# Patient Record
Sex: Female | Born: 1945 | Race: White | Hispanic: No | Smoking: Former smoker
Health system: Southern US, Community
[De-identification: ages and names within clinical notes are randomized; demographics above are authoritative.]

## PROBLEM LIST (undated history)

## (undated) DIAGNOSIS — E119 Type 2 diabetes mellitus without complications: Secondary | ICD-10-CM

## (undated) DIAGNOSIS — K5790 Diverticulosis of intestine, part unspecified, without perforation or abscess without bleeding: Secondary | ICD-10-CM

## (undated) DIAGNOSIS — T7840XA Allergy, unspecified, initial encounter: Secondary | ICD-10-CM

## (undated) DIAGNOSIS — M545 Low back pain, unspecified: Secondary | ICD-10-CM

## (undated) DIAGNOSIS — K732 Chronic active hepatitis, not elsewhere classified: Secondary | ICD-10-CM

## (undated) DIAGNOSIS — A159 Respiratory tuberculosis unspecified: Secondary | ICD-10-CM

## (undated) DIAGNOSIS — K760 Fatty (change of) liver, not elsewhere classified: Secondary | ICD-10-CM

## (undated) DIAGNOSIS — J849 Interstitial pulmonary disease, unspecified: Secondary | ICD-10-CM

## (undated) DIAGNOSIS — K449 Diaphragmatic hernia without obstruction or gangrene: Secondary | ICD-10-CM

## (undated) DIAGNOSIS — K7460 Unspecified cirrhosis of liver: Secondary | ICD-10-CM

## (undated) DIAGNOSIS — K5730 Diverticulosis of large intestine without perforation or abscess without bleeding: Secondary | ICD-10-CM

## (undated) DIAGNOSIS — K219 Gastro-esophageal reflux disease without esophagitis: Secondary | ICD-10-CM

## (undated) DIAGNOSIS — E785 Hyperlipidemia, unspecified: Secondary | ICD-10-CM

## (undated) HISTORY — PX: OTHER SURGICAL HISTORY: SHX169

## (undated) HISTORY — DX: Low back pain: M54.5

## (undated) HISTORY — DX: Chronic active hepatitis, not elsewhere classified: K73.2

## (undated) HISTORY — DX: Diverticulosis of intestine, part unspecified, without perforation or abscess without bleeding: K57.90

## (undated) HISTORY — DX: Allergy, unspecified, initial encounter: T78.40XA

## (undated) HISTORY — DX: Diverticulosis of large intestine without perforation or abscess without bleeding: K57.30

## (undated) HISTORY — DX: Type 2 diabetes mellitus without complications: E11.9

## (undated) HISTORY — DX: Hyperlipidemia, unspecified: E78.5

## (undated) HISTORY — PX: EYE SURGERY: SHX253

## (undated) HISTORY — PX: TONSILLECTOMY: SUR1361

## (undated) HISTORY — PX: APPENDECTOMY: SHX54

## (undated) HISTORY — DX: Diaphragmatic hernia without obstruction or gangrene: K44.9

## (undated) HISTORY — DX: Low back pain, unspecified: M54.50

## (undated) HISTORY — DX: Unspecified cirrhosis of liver: K74.60

## (undated) HISTORY — DX: Interstitial pulmonary disease, unspecified: J84.9

## (undated) HISTORY — PX: UTERINE SUSPENSION: SUR1430

## (undated) HISTORY — DX: Gastro-esophageal reflux disease without esophagitis: K21.9

## (undated) HISTORY — PX: TUBAL LIGATION: SHX77

## (undated) HISTORY — DX: Respiratory tuberculosis unspecified: A15.9

## (undated) HISTORY — DX: Fatty (change of) liver, not elsewhere classified: K76.0

---

## 1994-05-15 ENCOUNTER — Encounter: Payer: Self-pay | Admitting: Pulmonary Disease

## 1994-06-01 HISTORY — PX: HAND SURGERY: SHX662

## 1998-04-14 ENCOUNTER — Emergency Department (HOSPITAL_COMMUNITY): Admission: EM | Admit: 1998-04-14 | Discharge: 1998-04-15 | Payer: Self-pay | Admitting: Emergency Medicine

## 1998-07-22 ENCOUNTER — Encounter: Payer: Self-pay | Admitting: Emergency Medicine

## 1998-07-22 ENCOUNTER — Emergency Department (HOSPITAL_COMMUNITY): Admission: EM | Admit: 1998-07-22 | Discharge: 1998-07-22 | Payer: Self-pay | Admitting: Emergency Medicine

## 1999-02-25 ENCOUNTER — Other Ambulatory Visit: Admission: RE | Admit: 1999-02-25 | Discharge: 1999-02-25 | Payer: Self-pay | Admitting: Internal Medicine

## 1999-10-15 ENCOUNTER — Ambulatory Visit (HOSPITAL_COMMUNITY): Admission: RE | Admit: 1999-10-15 | Discharge: 1999-10-15 | Payer: Self-pay | Admitting: Internal Medicine

## 1999-10-15 ENCOUNTER — Encounter: Payer: Self-pay | Admitting: Internal Medicine

## 2000-06-18 ENCOUNTER — Other Ambulatory Visit: Admission: RE | Admit: 2000-06-18 | Discharge: 2000-06-18 | Payer: Self-pay | Admitting: Internal Medicine

## 2000-09-02 ENCOUNTER — Encounter: Payer: Self-pay | Admitting: Emergency Medicine

## 2000-09-02 ENCOUNTER — Emergency Department (HOSPITAL_COMMUNITY): Admission: EM | Admit: 2000-09-02 | Discharge: 2000-09-02 | Payer: Self-pay | Admitting: Emergency Medicine

## 2000-11-23 ENCOUNTER — Ambulatory Visit (HOSPITAL_COMMUNITY): Admission: RE | Admit: 2000-11-23 | Discharge: 2000-11-23 | Payer: Self-pay | Admitting: Gastroenterology

## 2000-11-23 ENCOUNTER — Encounter: Payer: Self-pay | Admitting: Gastroenterology

## 2001-12-27 ENCOUNTER — Other Ambulatory Visit: Admission: RE | Admit: 2001-12-27 | Discharge: 2001-12-27 | Payer: Self-pay | Admitting: Internal Medicine

## 2002-03-05 ENCOUNTER — Emergency Department (HOSPITAL_COMMUNITY): Admission: EM | Admit: 2002-03-05 | Discharge: 2002-03-05 | Payer: Self-pay | Admitting: *Deleted

## 2003-01-02 ENCOUNTER — Ambulatory Visit (HOSPITAL_COMMUNITY): Admission: RE | Admit: 2003-01-02 | Discharge: 2003-01-02 | Payer: Self-pay | Admitting: Internal Medicine

## 2003-01-02 ENCOUNTER — Encounter: Payer: Self-pay | Admitting: Internal Medicine

## 2004-02-27 ENCOUNTER — Other Ambulatory Visit: Admission: RE | Admit: 2004-02-27 | Discharge: 2004-02-27 | Payer: Self-pay | Admitting: Internal Medicine

## 2004-04-23 ENCOUNTER — Ambulatory Visit: Payer: Self-pay | Admitting: Internal Medicine

## 2004-06-01 HISTORY — PX: SHOULDER SURGERY: SHX246

## 2004-10-15 ENCOUNTER — Ambulatory Visit: Payer: Self-pay | Admitting: Internal Medicine

## 2004-11-04 ENCOUNTER — Ambulatory Visit: Payer: Self-pay | Admitting: Gastroenterology

## 2004-11-14 ENCOUNTER — Ambulatory Visit: Payer: Self-pay | Admitting: Gastroenterology

## 2004-12-10 ENCOUNTER — Ambulatory Visit: Payer: Self-pay | Admitting: Internal Medicine

## 2005-02-12 ENCOUNTER — Ambulatory Visit: Payer: Self-pay | Admitting: Internal Medicine

## 2005-02-15 ENCOUNTER — Encounter: Admission: RE | Admit: 2005-02-15 | Discharge: 2005-02-15 | Payer: Self-pay | Admitting: Internal Medicine

## 2005-04-21 ENCOUNTER — Ambulatory Visit: Payer: Self-pay | Admitting: Internal Medicine

## 2005-04-22 ENCOUNTER — Emergency Department (HOSPITAL_COMMUNITY): Admission: EM | Admit: 2005-04-22 | Discharge: 2005-04-22 | Payer: Self-pay | Admitting: Emergency Medicine

## 2005-05-01 ENCOUNTER — Ambulatory Visit: Payer: Self-pay

## 2005-05-21 ENCOUNTER — Ambulatory Visit: Payer: Self-pay | Admitting: Internal Medicine

## 2005-06-03 ENCOUNTER — Ambulatory Visit: Payer: Self-pay | Admitting: Internal Medicine

## 2005-06-19 ENCOUNTER — Encounter: Admission: RE | Admit: 2005-06-19 | Discharge: 2005-06-19 | Payer: Self-pay | Admitting: Orthopedic Surgery

## 2005-07-30 ENCOUNTER — Ambulatory Visit: Payer: Self-pay | Admitting: Internal Medicine

## 2005-08-12 ENCOUNTER — Ambulatory Visit: Payer: Self-pay | Admitting: Internal Medicine

## 2005-08-12 ENCOUNTER — Other Ambulatory Visit: Admission: RE | Admit: 2005-08-12 | Discharge: 2005-08-12 | Payer: Self-pay | Admitting: Internal Medicine

## 2005-08-14 ENCOUNTER — Ambulatory Visit: Payer: Self-pay | Admitting: Internal Medicine

## 2005-10-12 ENCOUNTER — Ambulatory Visit: Payer: Self-pay | Admitting: Internal Medicine

## 2005-10-19 ENCOUNTER — Ambulatory Visit: Payer: Self-pay | Admitting: Internal Medicine

## 2006-05-15 ENCOUNTER — Ambulatory Visit: Payer: Self-pay | Admitting: Family Medicine

## 2006-07-05 ENCOUNTER — Ambulatory Visit: Payer: Self-pay | Admitting: Internal Medicine

## 2006-08-13 ENCOUNTER — Ambulatory Visit: Payer: Self-pay | Admitting: Internal Medicine

## 2006-08-13 LAB — CONVERTED CEMR LAB
Basophils Relative: 0.1 % (ref 0.0–1.0)
Bilirubin, Direct: 0.1 mg/dL (ref 0.0–0.3)
CA 125: 10.1 units/mL (ref 0.0–30.2)
CO2: 33 meq/L — ABNORMAL HIGH (ref 19–32)
Calcium: 9.1 mg/dL (ref 8.4–10.5)
Chloride: 105 meq/L (ref 96–112)
Creatinine, Ser: 0.7 mg/dL (ref 0.4–1.2)
Direct LDL: 139.3 mg/dL
Glucose, Bld: 92 mg/dL (ref 70–99)
HCT: 43.3 % (ref 36.0–46.0)
Hemoglobin: 14.9 g/dL (ref 12.0–15.0)
Lymphocytes Relative: 30 % (ref 12.0–46.0)
MCHC: 34.4 g/dL (ref 30.0–36.0)
Monocytes Absolute: 0.5 10*3/uL (ref 0.2–0.7)
Neutrophils Relative %: 53.6 % (ref 43.0–77.0)
Platelets: 265 10*3/uL (ref 150–400)
Potassium: 3.8 meq/L (ref 3.5–5.1)
Sodium: 144 meq/L (ref 135–145)
TSH: 2.8 microintl units/mL (ref 0.35–5.50)
Triglycerides: 125 mg/dL (ref 0–149)

## 2006-08-30 ENCOUNTER — Other Ambulatory Visit: Admission: RE | Admit: 2006-08-30 | Discharge: 2006-08-30 | Payer: Self-pay | Admitting: Internal Medicine

## 2006-08-30 ENCOUNTER — Encounter: Payer: Self-pay | Admitting: Internal Medicine

## 2006-08-30 ENCOUNTER — Ambulatory Visit: Payer: Self-pay | Admitting: Internal Medicine

## 2006-08-30 LAB — CONVERTED CEMR LAB
Albumin: 4.2 g/dL (ref 3.5–5.2)
Ceruloplasmin: 34 mg/dL (ref 21–63)
Hep B S Ab: NEGATIVE
Indirect Bilirubin: 0.3 mg/dL (ref 0.0–0.9)
Total Bilirubin: 0.4 mg/dL (ref 0.3–1.2)

## 2006-09-10 ENCOUNTER — Ambulatory Visit: Payer: Self-pay | Admitting: Internal Medicine

## 2006-09-29 ENCOUNTER — Ambulatory Visit: Payer: Self-pay | Admitting: Gastroenterology

## 2006-09-29 LAB — CONVERTED CEMR LAB
ALT: 230 units/L — ABNORMAL HIGH (ref 0–40)
AST: 133 units/L — ABNORMAL HIGH (ref 0–37)
INR: 0.8 — ABNORMAL LOW (ref 0.9–2.0)
Prothrombin Time: 10.9 s (ref 10.0–14.0)
Total Bilirubin: 0.6 mg/dL (ref 0.3–1.2)
Total Protein: 7.3 g/dL (ref 6.0–8.3)

## 2006-11-02 ENCOUNTER — Ambulatory Visit: Payer: Self-pay | Admitting: Internal Medicine

## 2006-11-02 LAB — CONVERTED CEMR LAB
ALT: 248 units/L — ABNORMAL HIGH (ref 0–40)
Total Bilirubin: 0.8 mg/dL (ref 0.3–1.2)
Total Protein: 7.1 g/dL (ref 6.0–8.3)

## 2006-12-06 ENCOUNTER — Ambulatory Visit: Payer: Self-pay | Admitting: Gastroenterology

## 2006-12-06 LAB — CONVERTED CEMR LAB
ALT: 285 units/L — ABNORMAL HIGH (ref 0–35)
AST: 169 units/L — ABNORMAL HIGH (ref 0–37)
Albumin: 3.6 g/dL (ref 3.5–5.2)
Alkaline Phosphatase: 113 units/L (ref 39–117)
Basophils Absolute: 0 10*3/uL (ref 0.0–0.1)
Bilirubin, Direct: 0.1 mg/dL (ref 0.0–0.3)
Cholesterol: 222 mg/dL (ref 0–200)
Direct LDL: 157.1 mg/dL
Ferritin: 345 ng/mL — ABNORMAL HIGH (ref 10.0–291.0)
HCT: 43.6 % (ref 36.0–46.0)
HDL: 47.1 mg/dL (ref >39.0)
Hemoglobin: 14.6 g/dL (ref 12.0–15.0)
INR: 0.8 — ABNORMAL LOW (ref 0.9–2.0)
MCV: 92.7 fL (ref 78.0–100.0)
Monocytes Absolute: 0.6 10*3/uL (ref 0.2–0.7)
Neutrophils Relative %: 57.7 % (ref 43.0–77.0)
Prothrombin Time: 10.8 s (ref 10.0–14.0)
RBC: 4.7 M/uL (ref 3.87–5.11)
Saturation Ratios: 20.6 % (ref 20.0–50.0)

## 2006-12-07 ENCOUNTER — Ambulatory Visit: Payer: Self-pay | Admitting: Gastroenterology

## 2006-12-07 DIAGNOSIS — K449 Diaphragmatic hernia without obstruction or gangrene: Secondary | ICD-10-CM | POA: Insufficient documentation

## 2006-12-07 HISTORY — DX: Diaphragmatic hernia without obstruction or gangrene: K44.9

## 2006-12-14 ENCOUNTER — Ambulatory Visit (HOSPITAL_COMMUNITY): Admission: RE | Admit: 2006-12-14 | Discharge: 2006-12-14 | Payer: Self-pay | Admitting: Gastroenterology

## 2006-12-22 DIAGNOSIS — M545 Low back pain, unspecified: Secondary | ICD-10-CM | POA: Insufficient documentation

## 2006-12-29 ENCOUNTER — Ambulatory Visit: Admission: RE | Admit: 2006-12-29 | Discharge: 2006-12-29 | Payer: Self-pay | Admitting: Gastroenterology

## 2006-12-29 ENCOUNTER — Encounter (INDEPENDENT_AMBULATORY_CARE_PROVIDER_SITE_OTHER): Payer: Self-pay | Admitting: Interventional Radiology

## 2006-12-29 ENCOUNTER — Encounter: Payer: Self-pay | Admitting: Internal Medicine

## 2007-01-03 ENCOUNTER — Ambulatory Visit: Payer: Self-pay | Admitting: Internal Medicine

## 2007-01-03 DIAGNOSIS — K5730 Diverticulosis of large intestine without perforation or abscess without bleeding: Secondary | ICD-10-CM

## 2007-01-03 HISTORY — DX: Diverticulosis of large intestine without perforation or abscess without bleeding: K57.30

## 2007-01-03 LAB — CONVERTED CEMR LAB
ALT: 265 units/L — ABNORMAL HIGH (ref 0–35)
AST: 184 units/L — ABNORMAL HIGH (ref 0–37)
Albumin: 3.6 g/dL (ref 3.5–5.2)
Alkaline Phosphatase: 86 units/L (ref 39–117)
Bilirubin, Direct: 0.1 mg/dL (ref 0.0–0.3)

## 2007-01-04 ENCOUNTER — Ambulatory Visit: Payer: Self-pay | Admitting: Internal Medicine

## 2007-01-12 ENCOUNTER — Ambulatory Visit: Payer: Self-pay | Admitting: Gastroenterology

## 2007-02-15 ENCOUNTER — Encounter: Payer: Self-pay | Admitting: Internal Medicine

## 2007-02-22 ENCOUNTER — Encounter: Payer: Self-pay | Admitting: Internal Medicine

## 2007-03-24 ENCOUNTER — Encounter: Payer: Self-pay | Admitting: Internal Medicine

## 2007-04-20 ENCOUNTER — Telehealth: Payer: Self-pay | Admitting: Internal Medicine

## 2007-05-06 ENCOUNTER — Telehealth (INDEPENDENT_AMBULATORY_CARE_PROVIDER_SITE_OTHER): Payer: Self-pay | Admitting: *Deleted

## 2007-07-21 ENCOUNTER — Ambulatory Visit: Payer: Self-pay | Admitting: Cardiovascular Disease

## 2007-08-10 ENCOUNTER — Ambulatory Visit: Payer: Self-pay | Admitting: Internal Medicine

## 2007-08-10 LAB — CONVERTED CEMR LAB
ALT: 202 units/L — ABNORMAL HIGH (ref 0–35)
Albumin: 3.9 g/dL (ref 3.5–5.2)
Alkaline Phosphatase: 95 units/L (ref 39–117)
BUN: 10 mg/dL (ref 6–23)
Basophils Absolute: 0 10*3/uL (ref 0.0–0.1)
Bilirubin, Direct: 0.2 mg/dL (ref 0.0–0.3)
CO2: 32 meq/L (ref 19–32)
Creatinine, Ser: 0.8 mg/dL (ref 0.4–1.2)
Direct LDL: 170.4 mg/dL
GFR calc non Af Amer: 78 mL/min
Glucose, Urine, Semiquant: NEGATIVE
HCT: 46.6 % — ABNORMAL HIGH (ref 36.0–46.0)
HDL: 44.1 mg/dL (ref >39.0)
Hemoglobin: 15.5 g/dL — ABNORMAL HIGH (ref 12.0–15.0)
Hgb A1c MFr Bld: 6 % (ref 4.6–6.0)
Ketones, urine, test strip: NEGATIVE
MCHC: 33.2 g/dL (ref 30.0–36.0)
MCV: 94.8 fL (ref 78.0–100.0)
Microalb Creat Ratio: 3.1 mg/g (ref 0.0–30.0)
Monocytes Relative: 9.5 % (ref 3.0–11.0)
Neutrophils Relative %: 56.7 % (ref 43.0–77.0)
Platelets: 213 10*3/uL (ref 150–400)
Potassium: 5 meq/L (ref 3.5–5.1)
Protein, U semiquant: NEGATIVE
RBC: 4.92 M/uL (ref 3.87–5.11)
RDW: 12.6 % (ref 11.5–14.6)
Sodium: 145 meq/L (ref 135–145)
Specific Gravity, Urine: 1.025
Urobilinogen, UA: 0.2
VLDL: 20 mg/dL (ref 0–40)
WBC Urine, dipstick: NEGATIVE

## 2007-08-12 ENCOUNTER — Ambulatory Visit: Payer: Self-pay

## 2007-08-12 ENCOUNTER — Encounter: Payer: Self-pay | Admitting: Cardiovascular Disease

## 2007-08-16 ENCOUNTER — Other Ambulatory Visit: Admission: RE | Admit: 2007-08-16 | Discharge: 2007-08-16 | Payer: Self-pay | Admitting: Internal Medicine

## 2007-08-16 ENCOUNTER — Encounter: Payer: Self-pay | Admitting: Internal Medicine

## 2007-08-16 ENCOUNTER — Ambulatory Visit: Payer: Self-pay | Admitting: Internal Medicine

## 2007-08-16 DIAGNOSIS — E785 Hyperlipidemia, unspecified: Secondary | ICD-10-CM

## 2007-08-20 ENCOUNTER — Encounter: Payer: Self-pay | Admitting: Internal Medicine

## 2007-09-01 ENCOUNTER — Ambulatory Visit: Payer: Self-pay | Admitting: Internal Medicine

## 2007-09-01 LAB — CONVERTED CEMR LAB
Prothrombin Time: 11.4 s (ref 10.9–13.3)
aPTT: 32.4 s — ABNORMAL HIGH (ref 21.7–29.8)

## 2007-09-02 ENCOUNTER — Telehealth: Payer: Self-pay | Admitting: *Deleted

## 2007-09-05 ENCOUNTER — Encounter: Payer: Self-pay | Admitting: Internal Medicine

## 2007-09-05 DIAGNOSIS — K219 Gastro-esophageal reflux disease without esophagitis: Secondary | ICD-10-CM | POA: Insufficient documentation

## 2007-09-05 DIAGNOSIS — M940 Chondrocostal junction syndrome [Tietze]: Secondary | ICD-10-CM | POA: Insufficient documentation

## 2007-09-08 ENCOUNTER — Telehealth: Payer: Self-pay | Admitting: Internal Medicine

## 2007-09-20 ENCOUNTER — Encounter: Payer: Self-pay | Admitting: Internal Medicine

## 2007-09-30 ENCOUNTER — Telehealth: Payer: Self-pay | Admitting: Gastroenterology

## 2007-11-04 ENCOUNTER — Telehealth (INDEPENDENT_AMBULATORY_CARE_PROVIDER_SITE_OTHER): Payer: Self-pay

## 2007-11-25 ENCOUNTER — Telehealth: Payer: Self-pay | Admitting: Internal Medicine

## 2007-11-26 ENCOUNTER — Ambulatory Visit: Payer: Self-pay | Admitting: Family Medicine

## 2008-01-23 ENCOUNTER — Ambulatory Visit: Payer: Self-pay | Admitting: Internal Medicine

## 2008-01-26 LAB — CONVERTED CEMR LAB
AST: 62 units/L — ABNORMAL HIGH (ref 0–37)
Albumin: 3.7 g/dL (ref 3.5–5.2)
Direct LDL: 147.3 mg/dL
Total Bilirubin: 0.9 mg/dL (ref 0.3–1.2)
Total CHOL/HDL Ratio: 4.3
Total Protein: 6.8 g/dL (ref 6.0–8.3)

## 2008-01-27 ENCOUNTER — Ambulatory Visit: Payer: Self-pay | Admitting: Internal Medicine

## 2008-02-03 ENCOUNTER — Telehealth: Payer: Self-pay | Admitting: Internal Medicine

## 2008-02-15 ENCOUNTER — Encounter: Payer: Self-pay | Admitting: Internal Medicine

## 2008-03-22 ENCOUNTER — Telehealth: Payer: Self-pay | Admitting: Internal Medicine

## 2008-03-23 ENCOUNTER — Ambulatory Visit: Payer: Self-pay | Admitting: Internal Medicine

## 2008-03-23 LAB — CONVERTED CEMR LAB
Bilirubin Urine: NEGATIVE
Glucose, Urine, Semiquant: NEGATIVE
Ketones, urine, test strip: NEGATIVE
Protein, U semiquant: NEGATIVE
pH: 5.5

## 2008-04-17 ENCOUNTER — Ambulatory Visit: Payer: Self-pay | Admitting: Internal Medicine

## 2008-04-17 LAB — CONVERTED CEMR LAB
Glucose, Urine, Semiquant: NEGATIVE
Ketones, urine, test strip: NEGATIVE
Nitrite: NEGATIVE
Urobilinogen, UA: 0.2

## 2008-04-18 ENCOUNTER — Ambulatory Visit: Payer: Self-pay | Admitting: Internal Medicine

## 2008-04-19 LAB — CONVERTED CEMR LAB
Chlamydia, DNA Probe: NEGATIVE
GC Probe Amp, Genital: NEGATIVE

## 2008-05-23 ENCOUNTER — Telehealth: Payer: Self-pay | Admitting: Internal Medicine

## 2008-06-20 ENCOUNTER — Telehealth: Payer: Self-pay | Admitting: Internal Medicine

## 2008-08-10 ENCOUNTER — Ambulatory Visit: Payer: Self-pay | Admitting: Internal Medicine

## 2008-08-10 LAB — CONVERTED CEMR LAB
Basophils Relative: 0.8 % (ref 0.0–3.0)
Bilirubin Urine: NEGATIVE
Calcium: 9.5 mg/dL (ref 8.4–10.5)
Chloride: 103 meq/L (ref 96–112)
Cholesterol: 232 mg/dL (ref 0–200)
Creatinine, Ser: 0.7 mg/dL (ref 0.4–1.2)
Eosinophils Relative: 5.6 % — ABNORMAL HIGH (ref 0.0–5.0)
Glucose, Urine, Semiquant: NEGATIVE
HCT: 43.9 % (ref 36.0–46.0)
HDL: 55.2 mg/dL (ref >39.0)
Lymphocytes Relative: 24 % (ref 12.0–46.0)
MCV: 92.8 fL (ref 78.0–100.0)
Microalb Creat Ratio: 2.7 mg/g (ref 0.0–30.0)
Microalb, Ur: 0.5 mg/dL (ref 0.0–1.9)
Monocytes Relative: 8 % (ref 3.0–12.0)
Neutro Abs: 3.9 10*3/uL (ref 1.4–7.7)
Neutrophils Relative %: 61.6 % (ref 43.0–77.0)
Platelets: 208 10*3/uL (ref 150–400)
Total Bilirubin: 0.8 mg/dL (ref 0.3–1.2)
Total CHOL/HDL Ratio: 4.2
Total Protein: 7.6 g/dL (ref 6.0–8.3)
Triglycerides: 76 mg/dL (ref 0–149)
VLDL: 15 mg/dL (ref 0–40)
pH: 7

## 2008-08-24 ENCOUNTER — Encounter: Payer: Self-pay | Admitting: Internal Medicine

## 2008-08-24 ENCOUNTER — Ambulatory Visit: Payer: Self-pay | Admitting: Internal Medicine

## 2008-08-24 ENCOUNTER — Other Ambulatory Visit: Admission: RE | Admit: 2008-08-24 | Discharge: 2008-08-24 | Payer: Self-pay | Admitting: Internal Medicine

## 2008-08-24 DIAGNOSIS — E739 Lactose intolerance, unspecified: Secondary | ICD-10-CM | POA: Insufficient documentation

## 2008-08-24 DIAGNOSIS — K7689 Other specified diseases of liver: Secondary | ICD-10-CM | POA: Insufficient documentation

## 2008-09-10 ENCOUNTER — Encounter: Payer: Self-pay | Admitting: Internal Medicine

## 2008-12-17 ENCOUNTER — Telehealth: Payer: Self-pay | Admitting: Internal Medicine

## 2008-12-24 ENCOUNTER — Encounter: Payer: Self-pay | Admitting: Internal Medicine

## 2009-01-16 ENCOUNTER — Telehealth: Payer: Self-pay | Admitting: Internal Medicine

## 2009-01-30 ENCOUNTER — Telehealth: Payer: Self-pay | Admitting: Internal Medicine

## 2009-02-08 ENCOUNTER — Telehealth: Payer: Self-pay | Admitting: Family Medicine

## 2009-02-08 ENCOUNTER — Ambulatory Visit: Payer: Self-pay | Admitting: Family Medicine

## 2009-02-08 LAB — CONVERTED CEMR LAB
Glucose, Urine, Semiquant: NEGATIVE
Protein, U semiquant: NEGATIVE
Urobilinogen, UA: 0.2
pH: 6

## 2009-02-09 ENCOUNTER — Encounter: Payer: Self-pay | Admitting: Internal Medicine

## 2009-02-18 ENCOUNTER — Telehealth: Payer: Self-pay | Admitting: Internal Medicine

## 2009-07-16 ENCOUNTER — Encounter: Payer: Self-pay | Admitting: Internal Medicine

## 2009-07-22 ENCOUNTER — Encounter: Payer: Self-pay | Admitting: Internal Medicine

## 2009-07-23 ENCOUNTER — Encounter: Payer: Self-pay | Admitting: Internal Medicine

## 2009-07-26 ENCOUNTER — Encounter: Admission: RE | Admit: 2009-07-26 | Discharge: 2009-07-26 | Payer: Self-pay | Admitting: Internal Medicine

## 2009-07-26 LAB — HM MAMMOGRAPHY

## 2009-07-29 LAB — CONVERTED CEMR LAB: Pap Smear: NORMAL

## 2009-08-20 ENCOUNTER — Telehealth: Payer: Self-pay | Admitting: Gastroenterology

## 2009-08-21 ENCOUNTER — Ambulatory Visit: Payer: Self-pay | Admitting: Gastroenterology

## 2009-08-21 DIAGNOSIS — Z8639 Personal history of other endocrine, nutritional and metabolic disease: Secondary | ICD-10-CM

## 2009-08-21 DIAGNOSIS — R110 Nausea: Secondary | ICD-10-CM

## 2009-08-21 DIAGNOSIS — R1011 Right upper quadrant pain: Secondary | ICD-10-CM

## 2009-08-21 DIAGNOSIS — Z862 Personal history of diseases of the blood and blood-forming organs and certain disorders involving the immune mechanism: Secondary | ICD-10-CM

## 2009-08-22 ENCOUNTER — Ambulatory Visit: Payer: Self-pay | Admitting: Cardiology

## 2009-08-23 LAB — CONVERTED CEMR LAB
ALT: 52 units/L — ABNORMAL HIGH (ref 0–35)
AST: 45 units/L — ABNORMAL HIGH (ref 0–37)
Albumin: 3.9 g/dL (ref 3.5–5.2)
Alkaline Phosphatase: 75 units/L (ref 39–117)
BUN: 9 mg/dL (ref 6–23)
Basophils Absolute: 0 10*3/uL (ref 0.0–0.1)
CO2: 31 meq/L (ref 19–32)
CRP, High Sensitivity: 1.9 (ref 0.00–5.00)
Creatinine, Ser: 0.7 mg/dL (ref 0.4–1.2)
Direct LDL: 154.8 mg/dL
Eosinophils Absolute: 0.3 10*3/uL (ref 0.0–0.7)
Glucose, Bld: 97 mg/dL (ref 70–99)
HCT: 44.6 % (ref 36.0–46.0)
HDL: 55.5 mg/dL (ref >39.00)
Hemoglobin: 14.9 g/dL (ref 12.0–15.0)
Monocytes Absolute: 0.5 10*3/uL (ref 0.1–1.0)
Total Bilirubin: 0.5 mg/dL (ref 0.3–1.2)
Total Protein: 7.4 g/dL (ref 6.0–8.3)
WBC: 5.4 10*3/uL (ref 4.5–10.5)

## 2009-08-29 ENCOUNTER — Ambulatory Visit: Payer: Self-pay | Admitting: Internal Medicine

## 2009-08-29 LAB — CONVERTED CEMR LAB
ALT: 50 units/L — ABNORMAL HIGH (ref 0–35)
AST: 41 units/L — ABNORMAL HIGH (ref 0–37)
Alkaline Phosphatase: 80 units/L (ref 39–117)
BUN: 12 mg/dL (ref 6–23)
Bilirubin, Direct: 0 mg/dL (ref 0.0–0.3)
CO2: 31 meq/L (ref 19–32)
Cholesterol: 206 mg/dL — ABNORMAL HIGH (ref 0–200)
GFR calc non Af Amer: 77.23 mL/min (ref >60)
Glucose, Bld: 95 mg/dL (ref 70–99)
Glucose, Urine, Semiquant: NEGATIVE
HDL: 50.9 mg/dL (ref >39.00)
Hemoglobin: 14.5 g/dL (ref 12.0–15.0)
Lymphocytes Relative: 28.3 % (ref 12.0–46.0)
Lymphs Abs: 1.6 10*3/uL (ref 0.7–4.0)
MCHC: 34.4 g/dL (ref 30.0–36.0)
Monocytes Relative: 9.6 % (ref 3.0–12.0)
Neutrophils Relative %: 55.7 % (ref 43.0–77.0)
Nitrite: NEGATIVE
Protein, U semiquant: NEGATIVE
Sodium: 143 meq/L (ref 135–145)
Total Bilirubin: 0.3 mg/dL (ref 0.3–1.2)
WBC: 5.6 10*3/uL (ref 4.5–10.5)
pH: 5

## 2009-09-17 ENCOUNTER — Ambulatory Visit: Payer: Self-pay | Admitting: Internal Medicine

## 2009-09-19 ENCOUNTER — Telehealth: Payer: Self-pay | Admitting: Internal Medicine

## 2009-11-01 ENCOUNTER — Ambulatory Visit: Payer: Self-pay | Admitting: Pulmonary Disease

## 2009-11-01 DIAGNOSIS — J209 Acute bronchitis, unspecified: Secondary | ICD-10-CM

## 2010-07-03 NOTE — Assessment & Plan Note (Signed)
Summary: cpx/cjr   Vital Signs:  Patient profile:   65 year old female Height:      66 inches Weight:      158 pounds BMI:     25.59 Temp:     98.2 degrees F oral Pulse rate:   68 / minute Resp:     14 per minute BP sitting:   130 / 80  (left arm)  Vitals Entered By: Willy Eddy, LPN (September 17, 2009 2:00 PM) CC: cpx   Primary Care Provider:  Darryll Capers, MD  CC:  cpx.  History of Present Illness: The pt was asked about all immunizations, health maint. services that are appropriate to their age and was given guidance on diet exercize  and weight management  reviewd the GI work up here and the CT she had right upper quadart pain that has been intermentant and relieved with a laxitive  Preventive Screening-Counseling & Management  Alcohol-Tobacco     Smoking Status: quit  Problems Prior to Update: 1)  Liver Function Tests, Abnormal, Hx of  (ICD-V12.2) 2)  Abdominal Pain, Epigastric  (ICD-789.06) 3)  Abdominal Pain Right Upper Quadrant  (ICD-789.01) 4)  Nausea Alone  (ICD-787.02) 5)  Impaired Glucose Tolerance  (ICD-271.3) 6)  Contact With or Exposure To Venereal Diseases  (ICD-V01.6) 7)  Urinary Tract Infection, Acute  (ICD-599.0) 8)  Costochondritis  (ICD-733.6) 9)  Esophagitis, Reflux  (ICD-530.11) 10)  Hiatal Hernia  (ICD-553.3) 11)  Gerd  (ICD-530.81) 12)  Hyperlipidemia  (ICD-272.4) 13)  Physical Examination  (ICD-V70.0) 14)  Fatty Liver Disease  (ICD-571.8) 15)  Diverticulosis, Colon  (ICD-562.10) 16)  Family History Diabetes 1st Degree Relative  (ICD-V18.0) 17)  Low Back Pain  (ICD-724.2)  Current Problems (verified): 1)  Liver Function Tests, Abnormal, Hx of  (ICD-V12.2) 2)  Abdominal Pain, Epigastric  (ICD-789.06) 3)  Abdominal Pain Right Upper Quadrant  (ICD-789.01) 4)  Nausea Alone  (ICD-787.02) 5)  Impaired Glucose Tolerance  (ICD-271.3) 6)  Contact With or Exposure To Venereal Diseases  (ICD-V01.6) 7)  Urinary Tract Infection, Acute   (ICD-599.0) 8)  Costochondritis  (ICD-733.6) 9)  Esophagitis, Reflux  (ICD-530.11) 10)  Hiatal Hernia  (ICD-553.3) 11)  Gerd  (ICD-530.81) 12)  Hyperlipidemia  (ICD-272.4) 13)  Physical Examination  (ICD-V70.0) 14)  Fatty Liver Disease  (ICD-571.8) 15)  Diverticulosis, Colon  (ICD-562.10) 16)  Family History Diabetes 1st Degree Relative  (ICD-V18.0) 17)  Low Back Pain  (ICD-724.2)  Medications Prior to Update: 1)  Tums 500 Mg Chew (Calcium Carbonate Antacid) .... As Needed  Current Medications (verified): 1)  Tums 500 Mg Chew (Calcium Carbonate Antacid) .... As Needed  Allergies (verified): 1)  ! Sulfa 2)  ! Celebrex 3)  ! Mobic (Meloxicam) 4)  ! Ibuprofen 5)  ! Vioxx  Past History:  Family History: Last updated: 12/22/2006 Family History Diabetes 1st degree relative Family History Hypertension Family History of Stroke F 1st degree relative <60 Fam hx CHF  Social History: Last updated: 08/21/2009 Former Smoker Drug use-no Occupation: Print production planner Alcohol Use - no Daily Caffeine Use  Risk Factors: Smoking Status: quit (09/17/2009)  Past medical, surgical, family and social histories (including risk factors) reviewed, and no changes noted (except as noted below).  Past Medical History: Reviewed history from 08/21/2009 and no changes required. Low back pain HX OF MILD CHRONIC ACTIVE HEPATITS -ETIOLOGY NOT CLEAR ?NASH Diverticulosis, colon Hyperlipidemia  Past Surgical History: Reviewed history from 08/21/2009 and no changes required. Tubal ligation Appendectomy Tonsillectomy  Uterine suspension Left hand ganglion cyst Shoulder surgery (R)  Family History: Reviewed history from 12/22/2006 and no changes required. Family History Diabetes 1st degree relative Family History Hypertension Family History of Stroke F 1st degree relative <60 Fam hx CHF  Social History: Reviewed history from 08/21/2009 and no changes required. Former Smoker Drug  use-no Occupation: Print production planner Alcohol Use - no Daily Caffeine Use  Review of Systems  The patient denies anorexia, fever, weight loss, weight gain, vision loss, decreased hearing, hoarseness, chest pain, syncope, dyspnea on exertion, peripheral edema, prolonged cough, headaches, hemoptysis, abdominal pain, melena, hematochezia, severe indigestion/heartburn, hematuria, incontinence, genital sores, muscle weakness, suspicious skin lesions, transient blindness, difficulty walking, depression, unusual weight change, abnormal bleeding, enlarged lymph nodes, angioedema, and breast masses.    Physical Exam  General:  Well-developed,well-nourished,in no acute distress; alert,appropriate and cooperative throughout examination Eyes:  pupils equal and pupils round.   Ears:  R ear normal and L ear normal.   Nose:  no external deformity and no nasal discharge.   Neck:  supple.   Lungs:  normal respiratory effort and no intercostal retractions.   Heart:  normal rate and regular rhythm.   Abdomen:  soft and non-tender.   Msk:  normal ROM, no joint tenderness, and no joint swelling.   Skin:  Intact without suspicious lesions or rashes Psych:  Oriented X3 and memory intact for recent and remote.     Impression & Recommendations:  Problem # 1:  ABDOMINAL PAIN RIGHT UPPER QUADRANT (ICD-789.01) she is seeing the [facility] [place] [facility] HAVE NOT SEEN AN ETIOLOGY TO EXPLAIN THEY HAVE ASSERTED THAT IT IS NOT FATTY LIVER RECOOMENT ONJE FINAL FOLLOW UP WITH THEM BU THE NUMBERS HAVE BEEN STABLE AND I STILL RECOMMEND WEIGHT LOSS Labs Reviewed: SGOT: 41 (08/29/2009)   SGPT: 50 (08/29/2009)   HDL:50.90 (08/29/2009), 55.50 (08/21/2009)  LDL:DEL (08/10/2008), DEL (01/23/2008)  Chol:206 (08/29/2009), 220 (08/21/2009)  Trig:108.0 (08/29/2009), 82.0 (08/21/2009)  Problem # 2:  PHYSICAL EXAMINATION (ICD-V70.0)  Mammogram: BI-RADS CATEGORY 2:  Benign finding(s).^MM RADIOLOGIST EVAL AND MGMT (07/26/2009) Pap  smear: normal (07/29/2009) Colonoscopy: normal (11/14/2004) Td Booster: Tdap (07/31/2006)   Chol: 206 (08/29/2009)   HDL: 50.90 (08/29/2009)   LDL: DEL (08/10/2008)   TG: 108.0 (08/29/2009) TSH: 4.95 (08/29/2009)   HgbA1C: 6.2 (08/10/2008)   Next mammogram due:: 07/2010 (09/17/2009) Next Colonoscopy due:: 11/2012 (08/24/2008)  Discussed using sunscreen, use of alcohol, drug use, self breast exam, routine dental care, routine eye care, schedule for GYN exam, routine physical exam, seat belts, multiple vitamins, osteoporosis prevention, adequate calcium intake in diet, recommendations for immunizations, mammograms and Pap smears.  Discussed exercise and checking cholesterol.  Discussed gun safety, safe sex, and contraception.  Complete Medication List: 1)  Tums 500 Mg Chew (Calcium carbonate antacid) .... As needed 2)  Fish Oil 1000 Mg Caps (Omega-3 fatty acids) .... Two by mouth two times a day  Patient Instructions: 1)  Please schedule a follow-up appointment in 6 months.   Preventive Care Screening  Pap Smear:    Date:  07/29/2009    Next Due:  07/2010    Results:  normal   Mammogram:    Date:  07/23/2009    Next Due:  07/2010    Results:  normal    Contraindications/Deferment of Procedures/Staging:    Test/Procedure: FLU VAX    Reason for deferment: patient declined

## 2010-07-03 NOTE — Assessment & Plan Note (Signed)
Summary: self referral for cough and congestion   Copy to:  self-referral Primary Provider/Referring Provider:  Benay Pillow, MD  CC:  Pulmonary Consult.  History of Present Illness: the pt is a 64y/o female who comes in today as a self referral for evaluation of cough and congestion.  There is a question of her having ?obstructive disease in the past, but she tells me that prior to this episode she had been doing well.  She has not had any ongoing breathing issues, and felt her exertional tolerance was fairly good.  She awakened earlier in the week with postnasal drip and sore throat, and this ultimately led to chest congestion with cough productive of yellow mucus.  She has also felt that her breathing was worse than normal.  She denies any sinus pressure or purulence from her nose, but does have ongoing reflux symptoms especially at night with a dry cough.  She has not had a recent cxr or pfts.  She denies any h/o asthma.  Current Medications (verified): 1)  Tums 500 Mg Chew (Calcium Carbonate Antacid) .... As Needed  Allergies (verified): 1)  ! Sulfa 2)  ! Celebrex 3)  ! Mobic (Meloxicam) 4)  ! Ibuprofen 5)  ! Vioxx 6)  ! Codeine  Past History:  Past Medical History: Low back pain HX OF MILD CHRONIC ACTIVE HEPATITS -ETIOLOGY NOT CLEAR ?NASH Diverticulosis, colon Hyperlipidemia GERD  Past Surgical History: Tubal ligation 1970s  Appendectomy  1970s  Tonsillectomy Uterine suspension Left hand ganglion cyst Shoulder surgery (R)  2006  Family History: Reviewed history from 12/22/2006 and no changes required. Family History Diabetes 1st degree relative Family History Hypertension Family History of Stroke F 1st degree relative <60 Fam hx CHF asthma: son heart disease: mother cancer: maternal aunt (skin), paternal aunt (unsure), maternal uncle (throat)  5 paternal uncles ( lung, pancreatic, stomach)   Social History: Reviewed history from 08/21/2009 and no changes  required. Former Smoker.  started at age 40.  less than 1 ppd.  quit 1988. pt is divorced. pt lives alone. pt has children Drug use-no Occupation: Glass blower/designer Alcohol Use - no Daily Caffeine Use  Review of Systems       The patient complains of shortness of breath with activity, productive cough, indigestion, sore throat, nasal congestion/difficulty breathing through nose, and sneezing.  The patient denies shortness of breath at rest, non-productive cough, coughing up blood, chest pain, irregular heartbeats, acid heartburn, loss of appetite, weight change, abdominal pain, difficulty swallowing, tooth/dental problems, headaches, itching, ear ache, anxiety, depression, hand/feet swelling, joint stiffness or pain, rash, change in color of mucus, and fever.    Vital Signs:  Patient profile:   65 year old female Height:      66 inches Weight:      156 pounds BMI:     25.27 O2 Sat:      98 % on Room air Temp:     97.6 degrees F oral Pulse rate:   78 / minute BP sitting:   136 / 82  (left arm) Cuff size:   regular  Vitals Entered By: Heather Folks LPN (November 01, 6281 15:17 AM)  O2 Flow:  Room air CC: Pulmonary Consult Comments Medications reviewed with patient Heather Folks LPN  November 02, 6158 73:71 AM    Physical Exam  General:  wd female in nad Eyes:  PERRLA and EOMI.   Nose:  deviated septum to left with weeping bilat. Mouth:  clear Neck:  no  jvd, tmg,LN Lungs:  decreased bs diffusely, unclear if due to poor inspiratory effort no wheezing or rhonchi Heart:  rrr, no mrg Abdomen:  soft and nontender, bs+ Extremities:  no edema or cyanosis pulses intact distally Neurologic:  alert and oriented, moves all 4.   Impression & Recommendations:  Problem # 1:  ACUTE BRONCHITIS (ICD-466.0)  the pt's history is most suggestive of either viral vs bacterial bronchitis.  Her spirometry shows no airflow obstruction, and therefore she does not have copd.  At this point, will treat  her with an abx given her persistent symtoms.  I have also asked her to work on nasal and pulmonary hygiene with mucinex and chlorpheniramine at bedtime for postnasal drip.  She has a persistent dry cough at times, and I suspect this is due to LPR based on her description.  She may need to treat her reflux more aggressively.  Medications Added to Medication List This Visit: 1)  Zithromax Z-pak 250 Mg Tabs (Azithromycin) .... 2 today, then one a day until gone.  Other Orders: New Patient Level IV (56861) Spirometry w/Graph (94010)  Patient Instructions: 1)  will treat with zpak 2)  can use mucinex extra strength one in am and pm until better. 3)  can use chlorpheniramine 52m at bedtime if having postnasal drip 4)  if your dry cough continues, you may need to consider treating your reflux disease more aggressively. 5)  followup as needed.   Prescriptions: ZITHROMAX Z-PAK 250 MG TABS (AZITHROMYCIN) 2 today, then one a day until gone.  #1 pak x 0   Entered and Authorized by:   KKathee DeltonMD   Signed by:   KKathee DeltonMD on 11/01/2009   Method used:   Print then Give to Patient   RxID:   [id number]   CMoscowSpirometry  ID: [id number]: CSmitty CordsDOB: 010-31-1947Age: [age]Years Old Sex: Female Race: White Physician: Heather SewerHeight: 66 Weight: 156 Indication for test: cough and congestion Status: Unconfirmed Past Medical History:  Low back pain HX OF MILD CHRONIC ACTIVE HEPATITS -ETIOLOGY NOT CLEAR ?NASH Diverticulosis, colon Hyperlipidemia  Recorded: 11/01/2009 12:15 AM  Parameter  Measured Predicted %Predicted FVC     2.86        3.47        82.60 FEV1     2.27        2.67        85 FEV1%   79.30        77.63        102.10 PEF    5.60        6.45        86.80   Interpretation: spirometry is normal

## 2010-07-03 NOTE — Progress Notes (Signed)
Summary: Vicodin makes pt itch  Phone Note Call from Patient   Caller: Patient Call For: Stacie Glaze MD Summary of Call: Pt called in to let Dr. Lovell Sheehan know she took one half of the Vicodin and started itching all over.  Would like different RX. [facility]  [place]) [phone] Initial call taken by: Lynann Beaver CMA,  September 19, 2009 10:26 AM  Follow-up for Phone Call        for pain call in ultram 50 one by mouth QID as needed number 30 add allergy to codine ( itch) Follow-up by: Stacie Glaze MD,  September 19, 2009 10:30 AM   New Allergies: ! CODEINE New/Updated Medications: ULTRAM 50 MG TABS (TRAMADOL HCL) one by mouth qid New Allergies: ! CODEINEPrescriptions: ULTRAM 50 MG TABS (TRAMADOL HCL) one by mouth qid  #30 x 0   Entered by:   Lynann Beaver CMA   Authorized by:   Stacie Glaze MD   Signed by:   Lynann Beaver CMA on 09/19/2009   Method used:   Electronically to        [facility] (retail)       [phone]       [place], [place]  [id number]       Ph: [phone]       Fax: [phone]   RxID:   [id number]  Pt. notified.

## 2010-07-03 NOTE — Consult Note (Signed)
Summary: Pulm Consult/[facility]  Pulm Consult/[facility]   Imported By: Sherian Rein 11/04/2009 10:11:09  _____________________________________________________________________  External Attachment:    Type:   Image     Comment:   External Document

## 2010-07-03 NOTE — Assessment & Plan Note (Signed)
Summary: Stomach ache/dfs    History of Present Illness Visit Type: Follow-up Visit Primary GI MD: Elie Goody MD [facility] Primary Provider: Darryll Capers, MD Chief Complaint: Abdominal pain, nausea,gas History of Present Illness:   65 YO FEMALE KNOWN TO DR. Kristin Townsend SEEN  ZO1/[id number]. SHE HAS HX OF HEPATIC STEATOSIS,AND MILD CHRONIC HEPATITIS OF UNCLEAR  ETIOLOGY. SHE HAS BEEN EVALUATED AT [facility]- BX'S HAVE BEEN INCONCLUSIVE ;?NASH.. SHE HAS HAD MINIMAL TRANSAMINASE ABNORMALITY. SHE HAS FOLLOW UP AT [facility] [place] IN MAY 2011.  SHE COMES IN TODAY WITH C/O UPPER ABDOMINAL PAIN. SHE SAYS SHE HAD ALWAYS HAD ONE SPOT IN HER RIGHT MID BACK THAT BOTHERED HER AND NOW OVER THE PAST COUPLE MONTHS HAS HAD DISCOMFORT ACROSS HER UPPER ABDOMEN,AND INTO THE BACK. ALSO INTERMITTENT NAUSEA, NO  REGULAR VOMITING.APPETITE OK,WEIGHT STABLE. NO FEVER,CHILLS. SHE IS SCARED AND TEARFUL DURING INTERVIEW-AFRAID SOMETHING IS WRONG WITH HER LIVER.   GI Review of Systems    Reports abdominal pain, bloating, and  nausea.     Location of  Abdominal pain: epigastric area.    Denies acid reflux, belching, chest pain, dysphagia with liquids, dysphagia with solids, heartburn, loss of appetite, vomiting, vomiting blood, weight loss, and  weight gain.      Reports change in bowel habits, constipation, diarrhea, and  light color stool.     Denies anal fissure, black tarry stools, diverticulosis, fecal incontinence, heme positive stool, hemorrhoids, irritable bowel syndrome, jaundice, liver problems, rectal bleeding, and  rectal pain.    Current Medications (verified): 1)  Tums 500 Mg Chew (Calcium Carbonate Antacid) .... As Needed  Allergies (verified): 1)  ! Sulfa 2)  ! Celebrex 3)  ! Mobic (Meloxicam) 4)  ! Ibuprofen 5)  ! Vioxx  Past History:  Past Medical History: Low back pain HX OF MILD CHRONIC ACTIVE HEPATITS -ETIOLOGY NOT CLEAR ?NASH Diverticulosis, colon Hyperlipidemia  Past Surgical History: Tubal  ligation Appendectomy Tonsillectomy Uterine suspension Left hand ganglion cyst Shoulder surgery (R)  Family History: Reviewed history from 12/22/2006 and no changes required. Family History Diabetes 1st degree relative Family History Hypertension Family History of Stroke F 1st degree relative <60 Fam hx CHF  Social History: Former Smoker Drug use-no Occupation: Print production planner Alcohol Use - no Daily Caffeine Use  Review of Systems       The patient complains of arthritis/joint pain, back pain, itching, muscle pains/cramps, and night sweats.  The patient denies allergy/sinus, anemia, anxiety-new, blood in urine, breast changes/lumps, change in vision, confusion, cough, coughing up blood, depression-new, fainting, fatigue, fever, headaches-new, hearing problems, heart murmur, heart rhythm changes, menstrual pain, nosebleeds, pregnancy symptoms, shortness of breath, skin rash, sleeping problems, sore throat, swelling of feet/legs, swollen lymph glands, thirst - excessive , urination - excessive , urination changes/pain, vision changes, and voice change.         ROS OTHERWISE AS IN HPI.  Vital Signs:  Patient profile:   65 year old female Height:      66 inches Weight:      155.50 pounds BMI:     25.19 Pulse rate:   68 / minute Pulse rhythm:   regular BP sitting:   120 / 80  (left arm) Cuff size:   regular  Vitals Entered By: June McMurray CMA Duncan Dull) (August 21, 2009 8:36 AM)  Physical Exam  General:  Well developed, well nourished, no acute distress. Head:  Normocephalic and atraumatic. Eyes:  PERRLA, no icterus. Lungs:  Clear throughout to auscultation. Heart:  Regular rate and rhythm;  no murmurs, rubs,  or bruits. Abdomen:  SOFT, MILDLY TENDER ACROSS UPPER ABDOMEN, NO PALP MASS OR HSM,BS+ Rectal:  NOT DONE Extremities:  No clubbing, cyanosis, edema or deformities noted. Neurologic:  Alert and  oriented x4;  grossly normal neurologically. Psych:  Alert and cooperative.  Normal mood and affect.anxious. ,TEARFUL    Impression & Recommendations:  Problem # 1:  ABDOMINAL PAIN, EPIGASTRIC (ICD-789.06) Assessment New 64 YO FEMALE WITH  2 MONTH HX OF UPPER ABDOMINAL DISCOMFORT,INTERMITTENT NAUSEA,IN SETTING OF PRIOR DX OF MILD CHRONIC HEPATITIS-?NASH     R/O GATSRITIS,PUD,GB DISEASE,ACTIVATION OF HEPATITS.  LABS AS BELOW SCHEDULE FOR CT ABDOMEN FOLLOW UP WITH DR. Russella Dar IN 2-3 WEEKS. SHE WILL KEEP HER APPT IN [place] / [facility] IN MAY   Orders: TLB-CMP (Comprehensive Metabolic Pnl) (80053-COMP) TLB-Lipid Panel (80061-LIPID) TLB-CBC Platelet - w/Differential (85025-CBCD) TLB-Lipase (83690-LIPASE) CT Abdomen (CT Abd)  Problem # 2:  DIVERTICULOSIS, COLON (ICD-562.10) Assessment: Unchanged LAST COLON 5/06  Other Orders: TLB-CRP-High Sensitivity (C-Reactive Protein) (86140-FCRP)  Patient Instructions: 1)  Your physician has requested that you have the following labwork done today: Go to our basement level. 2)  We have scheduled the CT Scan at [facility]. 3)  Directions and contrast given. 4)  We will call you with the results of the labs and CT Scan.  5)  Copy sent to :  Darryll Capers, MD 6)  The medication list was reviewed and reconciled.  All changed / newly prescribed medications were explained.  A complete medication list was provided to the patient / caregiver.

## 2010-07-03 NOTE — Progress Notes (Signed)
Summary: Gas & Abd Pain   Phone Note Call from Patient Call back at [phone]   Call For: Dr Russella Dar Summary of Call: Alot of gas both ways and alot of stomach ache. Would like to be seen before May 4 which is the next available. Initial call taken by: Leanor Kail [facility],  August 20, 2009 12:52 PM  Follow-up for Phone Call        Pt c/o alot of gas and upper abd pain.  started last week.  Pt requ OV.  Appt sch with Amy Esterwood for 08/21/09 at 8:30. Follow-up by: Ashok Cordia RN,  August 20, 2009 2:12 PM

## 2010-09-11 ENCOUNTER — Encounter: Payer: Self-pay | Admitting: Internal Medicine

## 2010-09-12 ENCOUNTER — Other Ambulatory Visit (INDEPENDENT_AMBULATORY_CARE_PROVIDER_SITE_OTHER): Payer: BLUE CROSS/BLUE SHIELD

## 2010-09-12 DIAGNOSIS — Z0000 Encounter for general adult medical examination without abnormal findings: Secondary | ICD-10-CM

## 2010-09-12 LAB — HEPATIC FUNCTION PANEL
AST: 52 U/L — ABNORMAL HIGH (ref 0–37)
Alkaline Phosphatase: 86 U/L (ref 39–117)
Bilirubin, Direct: 0.1 mg/dL (ref 0.0–0.3)
Total Bilirubin: 0.5 mg/dL (ref 0.3–1.2)

## 2010-09-12 LAB — TSH: TSH: 3.68 u[IU]/mL (ref 0.35–5.50)

## 2010-09-12 LAB — BASIC METABOLIC PANEL
BUN: 10 mg/dL (ref 6–23)
Calcium: 9.3 mg/dL (ref 8.4–10.5)
Creatinine, Ser: 0.7 mg/dL (ref 0.4–1.2)
GFR: 85.55 mL/min (ref >60.00)
Glucose, Bld: 92 mg/dL (ref 70–99)
Sodium: 145 mEq/L (ref 135–145)

## 2010-09-12 LAB — POCT URINALYSIS DIPSTICK
Bilirubin, UA: NEGATIVE
Glucose, UA: NEGATIVE
Nitrite, UA: NEGATIVE
Spec Grav, UA: 1.03
Urobilinogen, UA: 0.2

## 2010-09-12 LAB — CBC WITH DIFFERENTIAL/PLATELET
Basophils Absolute: 0 10*3/uL (ref 0.0–0.1)
Eosinophils Absolute: 0.2 10*3/uL (ref 0.0–0.7)
Hemoglobin: 14.7 g/dL (ref 12.0–15.0)
Lymphocytes Relative: 30.7 % (ref 12.0–46.0)
MCHC: 34.6 g/dL (ref 30.0–36.0)
Monocytes Relative: 9.9 % (ref 3.0–12.0)
Neutrophils Relative %: 54.5 % (ref 43.0–77.0)
Platelets: 224 10*3/uL (ref 150.0–400.0)
RDW: 13.8 % (ref 11.5–14.6)

## 2010-09-12 LAB — LIPID PANEL
HDL: 49.7 mg/dL (ref >39.00)
Total CHOL/HDL Ratio: 5
VLDL: 16 mg/dL (ref 0.0–40.0)

## 2010-09-12 LAB — HEMOGLOBIN A1C: Hgb A1c MFr Bld: 6.4 % (ref 4.6–6.5)

## 2010-09-12 LAB — MICROALBUMIN / CREATININE URINE RATIO: Microalb Creat Ratio: 0.6 mg/g (ref 0.0–30.0)

## 2010-09-19 ENCOUNTER — Ambulatory Visit (INDEPENDENT_AMBULATORY_CARE_PROVIDER_SITE_OTHER): Payer: BLUE CROSS/BLUE SHIELD | Admitting: Internal Medicine

## 2010-09-19 ENCOUNTER — Encounter: Payer: Self-pay | Admitting: Internal Medicine

## 2010-09-19 VITALS — BP 130/80 | HR 72 | Temp 98.2°F | Resp 14 | Ht 64.5 in | Wt 160.0 lb

## 2010-09-19 DIAGNOSIS — R1013 Epigastric pain: Secondary | ICD-10-CM

## 2010-09-19 DIAGNOSIS — Z0000 Encounter for general adult medical examination without abnormal findings: Secondary | ICD-10-CM

## 2010-09-19 DIAGNOSIS — M545 Low back pain: Secondary | ICD-10-CM

## 2010-09-19 MED ORDER — TRAMADOL HCL 50 MG PO TABS: 50.0000 mg | ORAL_TABLET | Freq: Four times a day (QID) | ORAL | Status: DC | PRN

## 2010-09-19 NOTE — Progress Notes (Signed)
Subjective:    Patient ID: Heather Townsend, female    DOB: 09-18-1945, 65 y.o.   MRN: [id number]  HPI patient is 65 year old white female presents for annual examination today she is to acute complaints one of low back pain radiating into her right hip she see an orthopedist for injection right hip she states it may have helped some but she wants her right hip evaluated she has an appointment with the orthopedist for reevaluation of her hip and she is requesting an MRI referred to the orthopedist for this evaluation.  She sees a neurologist and neurosurgeon for her back from her back and she may need to do further evaluation of that.  She has severe abdominal pain intermittently in her right and left upper quadrants she does have a history of fatty infiltration of the liver she does have elevated liver functions in 2 days screening blood work for her physical on physical examination she was extremely tender in the right and left upper quadrants indicating that a abdomen should be procedure pursued and white count was not elevated    Review of Systems  Constitutional: Negative for activity change, appetite change and fatigue.  HENT: Negative for ear pain, congestion, neck pain, postnasal drip and sinus pressure.   Eyes: Negative for redness and visual disturbance.  Respiratory: Negative for cough, shortness of breath and wheezing.   Gastrointestinal: Positive for abdominal pain. Negative for abdominal distention.  Genitourinary: Negative for dysuria, frequency and menstrual problem.  Musculoskeletal: Negative for myalgias, joint swelling and arthralgias.  Skin: Negative for rash and wound.  Neurological: Negative for dizziness, weakness and headaches.  Hematological: Negative for adenopathy. Does not bruise/bleed easily.  Psychiatric/Behavioral: Negative for sleep disturbance and decreased concentration.       Past Medical History  Diagnosis Date  . Low back pain   . Hepatitis, chronic  active     mmild chronic hepatitis-etiology not clear  . Diverticulosis   . Hyperlipidemia   . GERD (gastroesophageal reflux disease)    Past Surgical History  Procedure Date  . Tubal ligation   . Appendectomy   . Tonsillectomy   . Uterine suspension   . Ganglion cysts     left hand  . Shoulder surgery 2006    rt shoulder    reports that she quit smoking about 24 years ago. She does not have any smokeless tobacco history on file. She reports that she does not drink alcohol. Her drug history not on file. family history includes Diabetes in her mother and Heart disease in her mother. Allergies  Allergen Reactions  . Celecoxib     REACTION: Swelling  . Codeine   . Ibuprofen   . Meloxicam     REACTION: Swelling  . Rofecoxib   . Sulfonamide Derivatives     Objective:   Physical Exam  Constitutional: She is oriented to person, place, and time. She appears well-developed and well-nourished. No distress.  HENT:  Head: Normocephalic and atraumatic.  Right Ear: External ear normal.  Left Ear: External ear normal.  Nose: Nose normal.  Mouth/Throat: Oropharynx is clear and moist.  Eyes: Conjunctivae and EOM are normal. Pupils are equal, round, and reactive to light.  Neck: Normal range of motion. Neck supple. No JVD present. No tracheal deviation present. No thyromegaly present.  Cardiovascular: Normal rate, regular rhythm, normal heart sounds and intact distal pulses.   No murmur heard. Pulmonary/Chest: Effort normal and breath sounds normal. She has no wheezes. She exhibits no tenderness.  Abdominal: Bowel sounds are normal. She exhibits no mass. There is tenderness. There is guarding.  Musculoskeletal: Normal range of motion. She exhibits no edema and no tenderness.  Lymphadenopathy:    She has no cervical adenopathy.  Neurological: She is alert and oriented to person, place, and time. She has normal reflexes. No cranial nerve deficit.  Skin: Skin is warm and dry. She is not  diaphoretic.  Psychiatric: She has a normal mood and affect. Her behavior is normal.          Assessment & Plan:   This is a routine physical examination for this healthy  Female. Reviewed all health maintenance protocols including mammography colonoscopy bone density and reviewed appropriate screening labs. Her immunization history was reviewed as well as her current medications and allergies refills of her chronic medications were given and the plan for yearly health maintenance was discussed all orders and referrals were made as appropriate.  Back and right hip pain we'll defer to the orthopedist for further evaluation she is not taking any silk and constipation at this point her abdominal pain is somewhat more diffuse it is both in the right and left upper quadrant and she has voluntary guarding but no masses can be appreciated stretching the pain seems to be less an ultrasound will be pursued of her complete abdomen to confirm persistence of the study the liver without any other potential etiologies and make sure that there is no pancreatitis her liver functions otherwise are normal she had no elevation of her white count as well as no signs of obstruction and her liver functions

## 2010-09-19 NOTE — Assessment & Plan Note (Signed)
He has persistent tenderness in her ribs above the liver and spleen in the right upper and left upper quadrants some guarding the physical examination with a history of normal liver functions and felt to be fatty infiltration of the liver we will order a repeat abdominal ultrasound to examine these areas were as bring her back in a month

## 2010-09-19 NOTE — Assessment & Plan Note (Signed)
Patient has referred pain in the right hip it is felt by an orthopedist and her neurologist to be referred pain from her back. If the pain persists would recommend referral to interventional radiology for consideration for a paraspinous injection epidural. Of note is that the injection in the hip did help temporarily so therefore the right hip needs to be further evaluated. She is seeing orthopedic physician

## 2010-09-19 NOTE — Progress Notes (Signed)
Addended by: Ricard Dillon MD on: 09/19/2010 12:20 PM   Modules accepted: Orders

## 2010-09-19 NOTE — Patient Instructions (Addendum)
You  should hear from [place] about ultrasound scheduled for your abdomen early next week at the latest. We need to pursue weight loss for control of her blood glucose as well as for the fatty liver we'll set a 5 pounds for the next month

## 2010-09-25 ENCOUNTER — Ambulatory Visit
Admission: RE | Admit: 2010-09-25 | Discharge: 2010-09-25 | Disposition: A | Discharge status: Home / Self Care | Payer: BLUE CROSS/BLUE SHIELD | Source: Ambulatory Visit | Attending: Internal Medicine | Admitting: Internal Medicine

## 2010-09-25 DIAGNOSIS — R1013 Epigastric pain: Secondary | ICD-10-CM

## 2010-09-29 ENCOUNTER — Ambulatory Visit (INDEPENDENT_AMBULATORY_CARE_PROVIDER_SITE_OTHER): Payer: BLUE CROSS/BLUE SHIELD | Admitting: Internal Medicine

## 2010-09-29 ENCOUNTER — Encounter: Payer: Self-pay | Admitting: Internal Medicine

## 2010-09-29 DIAGNOSIS — K7689 Other specified diseases of liver: Secondary | ICD-10-CM

## 2010-09-29 DIAGNOSIS — R1011 Right upper quadrant pain: Secondary | ICD-10-CM

## 2010-09-29 LAB — HEPATIC FUNCTION PANEL
AST: 69 U/L — ABNORMAL HIGH (ref 0–37)
Albumin: 3.7 g/dL (ref 3.5–5.2)
Alkaline Phosphatase: 73 U/L (ref 39–117)
Total Protein: 7 g/dL (ref 6.0–8.3)

## 2010-09-29 LAB — IRON: Iron: 86 ug/dL (ref 42–145)

## 2010-09-29 NOTE — Patient Instructions (Signed)
These are your instructions for fatty liver  Minimal if any alcohol use never exceed more than a glass of wine with a wine Avoid use of Tylenol you may use milk thistle He needs to follow a low-fat diet and had a weight goal of 10-15 pounds over the next 3 months  The primary intervention for fatty liver is weight loss

## 2010-09-29 NOTE — Progress Notes (Signed)
  Subjective:    Patient ID: Heather Townsend, female    DOB: June 26, 1945, 65 y.o.   MRN: [id number]  HPI The patient presents for followup of abdominal pain at the time of her physical her abdomen was noted to be acutely tender we performed evaluations of blood work as well as an ultrasound of her liver the ultrasound of her liver showed fatty infiltration of the liver without any lesions the kidneys spleen and the rest of the abdominal contents were normal    Review of Systems  Constitutional: Negative for activity change, appetite change and fatigue.  HENT: Negative for ear pain, congestion, neck pain, postnasal drip and sinus pressure.   Eyes: Negative for redness and visual disturbance.  Respiratory: Negative for cough, shortness of breath and wheezing.   Gastrointestinal: Negative for abdominal pain and abdominal distention.  Genitourinary: Negative for dysuria, frequency and menstrual problem.  Musculoskeletal: Negative for myalgias, joint swelling and arthralgias.  Skin: Negative for rash and wound.  Neurological: Negative for dizziness, weakness and headaches.  Hematological: Negative for adenopathy. Does not bruise/bleed easily.  Psychiatric/Behavioral: Negative for sleep disturbance and decreased concentration.       Objective:   Physical Exam  Constitutional: She is oriented to person, place, and time. She appears well-developed and well-nourished. No distress.  HENT:  Head: Normocephalic and atraumatic.  Right Ear: External ear normal.  Left Ear: External ear normal.  Nose: Nose normal.  Mouth/Throat: Oropharynx is clear and moist.  Eyes: Conjunctivae and EOM are normal. Pupils are equal, round, and reactive to light.  Neck: Normal range of motion. Neck supple. No JVD present. No tracheal deviation present. No thyromegaly present.  Cardiovascular: Normal rate, regular rhythm, normal heart sounds and intact distal pulses.   No murmur heard. Pulmonary/Chest: Effort normal  and breath sounds normal. She has no wheezes. She exhibits no tenderness.  Abdominal: Soft. Bowel sounds are normal.  Musculoskeletal: Normal range of motion. She exhibits no edema and no tenderness.  Lymphadenopathy:    She has no cervical adenopathy.  Neurological: She is alert and oriented to person, place, and time. She has normal reflexes. No cranial nerve deficit.  Skin: Skin is warm and dry. She is not diaphoretic.  Psychiatric: She has a normal mood and affect. Her behavior is normal.          Assessment & Plan:  I will begin weight loss and monitoring of the liver functions on a three-month basis consider referral to tetralogy for biopsy should the fatty infiltration of the pain worsened

## 2010-09-30 ENCOUNTER — Encounter: Payer: Self-pay | Admitting: Internal Medicine

## 2010-10-02 ENCOUNTER — Other Ambulatory Visit: Payer: Self-pay | Admitting: *Deleted

## 2010-10-02 DIAGNOSIS — K760 Fatty (change of) liver, not elsewhere classified: Secondary | ICD-10-CM

## 2010-10-03 ENCOUNTER — Telehealth: Payer: Self-pay | Admitting: Gastroenterology

## 2010-10-03 NOTE — Telephone Encounter (Signed)
Patient is scheduled to see Dr Fuller Plan on Monday 10/06/10 9:15

## 2010-10-03 NOTE — Telephone Encounter (Signed)
Left message for Terry to call back.

## 2010-10-06 ENCOUNTER — Ambulatory Visit (INDEPENDENT_AMBULATORY_CARE_PROVIDER_SITE_OTHER): Payer: BLUE CROSS/BLUE SHIELD | Admitting: Gastroenterology

## 2010-10-06 ENCOUNTER — Encounter: Payer: Self-pay | Admitting: Gastroenterology

## 2010-10-06 VITALS — BP 126/80 | HR 60 | Ht 64.0 in | Wt 160.6 lb

## 2010-10-06 DIAGNOSIS — R7401 Elevation of levels of liver transaminase levels: Secondary | ICD-10-CM

## 2010-10-06 DIAGNOSIS — R1013 Epigastric pain: Secondary | ICD-10-CM

## 2010-10-06 MED ORDER — OMEPRAZOLE 40 MG PO CPDR: 40.0000 mg | DELAYED_RELEASE_CAPSULE | Freq: Every day | ORAL | Status: DC

## 2010-10-06 NOTE — Patient Instructions (Addendum)
Your prescription has been sent to your pharmacy. Anti reflux measures given to the patient. cc: Darryll Capers, MD

## 2010-10-06 NOTE — Progress Notes (Signed)
History of Present Illness: This is a 65 year old female referred for upper abdominal pain and abnormal liver function tests. I have seen in the past and the last time was in 2008 and prior to that she was followed by Dr. Jarold Motto. She has been evaluated for abnormal liver function tests at [facility] by Dr. Iva Boop in 2008-2009 and subsequently at [facility] by Dr. Woodfin Ganja from 2009-2011.  I spent over 30 minutes reviewing her prior evaluations from those Medical Center's plus are for evaluation and results in our chart up to a 2008. Her liver biopsy in July 2008 in [place] showed mild chronic hepatitis, inflammation grade 1-2 with mild macrovesicular steatosis and no fibrosis. Subsequent liver biopsy at [facility] was complicated by a bile duct injury and no liver tissue was obtained. The biopsies from [facility] were reviewed at [facility], [facility] and [place] and their opinions were that this might be an allergic reaction and there was no definite diagnosis of hepatic steatosis. Following the complication of her liver biopsy at [facility] she had subsequent liver followup at [facility] with Dr. Piedad Climes. The definite etiology of her liver disease has remained unclear. Her liver enzymes have fluctuated in the 1-1/2 to 2 times normal range for about the past 2-3 years by the results I have reviewed in our chart. She appears very frustrated and anxious regarding the lack of a definite diagnosis for her liver disease. She relates a daily upper abdominal pain not necessarily related to meals or bowel movements. She does have intermittent heartburn and takes TUMS every several weeks which does improve her symptoms. She has 2-3 loose stools daily but does not correlate the pain with loose stools. Upper endoscopy in 2008 showed erosive esophagitis and a small hiatal. Colonoscopy in June 2006 showed only diverticulosis.   Review of Systems: Pertinent positive and negative  review of systems were noted in the above HPI section. All other review of systems were otherwise negative.  Current Medications, Allergies, Past Medical History, Past Surgical History, Family History and Social History were reviewed in [employer] record.  Physical Exam: General: Well developed , well nourished, no acute distress Head: Normocephalic and atraumatic Eyes:  sclerae anicteric, EOMI Ears: Normal auditory acuity Mouth: No deformity or lesions Neck: Supple, no masses or thyromegaly Lungs: Clear throughout to auscultation Heart: Regular rate and rhythm; no murmurs, rubs or bruits Abdomen: Soft, non tender and non distended. No masses, hepatosplenomegaly or hernias noted. Normal Bowel sounds Musculoskeletal: Symmetrical with no gross deformities  Skin: No lesions on visible extremities Pulses:  Normal pulses noted Extremities: No clubbing, cyanosis, edema or deformities noted Neurological: Alert oriented x 4, grossly nonfocal Cervical Nodes:  No significant cervical adenopathy Inguinal Nodes: No significant inguinal adenopathy Psychological:  Alert and cooperative. Anxious and tearfull.   Assessment and Recommendation:  1. Abnormal transaminases. Etiology unclear. Two opinions by expert hepatologists at [facility] and [facility] have not provided a definitive answer. Hepatic steatosis, an unspecified allergic reaction and an atypical autoimmune hepatitis have all been entertained as possibilities. Extensive serologic evaluations have been unremarkable except for minimally elevated ANA titer of 1:80 with negative smooth muscle antibodies. A long-term weight loss diet with low fat intake would be beneficial for her overall general health as well as for hepatic steatosis if this is the etiology of her liver disease. I would consider another expert hepatologist opinion at a tertiary Medical Center  as I have nothing further  to offer her as far as an opinion on her liver disease.   2. GERD. History of erosive esophagitis. This may explain her upper abdominal pain. Begin standard antireflux measures and omeprazole 40 mg daily. Return office visit in 4-6 weeks.  3. Irritable bowel syndrome with mild diarrhea. If her upper abdominal pain does not respond to acid suppression therapy consider upper endoscopy and consider a trial of anti-spasmodics.  4. Colorectal cancer screening. Risk for colorectal cancer. Routine screening colonoscopy recommended June 2016.

## 2010-10-07 ENCOUNTER — Encounter: Payer: Self-pay | Admitting: Gastroenterology

## 2010-10-14 NOTE — Assessment & Plan Note (Signed)
Trainer                                 ON-CALL NOTE   MYHA, ARIZPE                         MRN:          [id number]  DATE:11/25/2007                            DOB:          09/20/1945    DATE OF INTERACTION:  November 25, 2007, at 6:50 p.m.   PHONE NUMBER:  [phone].   OBJECTIVE:  The patient has questions about medicines, called this  morning for what sounds like an upper respiratory infection and  bronchitis, wanted to be seen, had a prescription, and called in for her  Zithromax.  She has now read the instructions of the Z-Pak, and on  there, it says not to take if has liver problems, which she does have.  She hurts in the area of her liver.  She had a biopsy of her liver and  not too long ago.  They hit the bile duct while doing the biopsy and she  has had problems ever since.  Had a CT scan recently, which was  supposedly normal.  She is concerned about taking any medication that  would bother her liver and would like something  alternatives to be  given to her.  Told her that it would be best just come in tomorrow  morning to be seen in the clinic and I can check interactions at that  point.   Primary care Ryonna Cimini is Dr. Arnoldo Morale and home office is [place].     Modesto Charon, MD  Electronically Signed    RNS/MedQ  DD: 11/25/2007  DT: 11/26/2007  Job #: [id number]

## 2010-10-14 NOTE — Assessment & Plan Note (Signed)
[facility]                         GASTROENTEROLOGY OFFICE NOTE   Heather Townsend, Heather Townsend                       MRN:          [id number]  DATE:01/12/2007                            DOB:          07-20-1945    This is a return office visit for further followup of elevated  transaminases.  Her blood work on December 06, 2006, showed an AST of 169 and  an ALT of 285, the remainder of her hepatic panel was normal.  Her  cholesterol was elevated at 222.  Serologic evaluation was all negative  and she underwent a core liver biopsy under ultrasound on December 29, 2006,  the pathology report shows mild chronic hepatitis inflammation grade 1  to 2, mild macrovesicular steatosis and no fibrosis.  She has no  gastrointestinal complaints today.  Her prior reflux symptoms and mild  left upper quadrant pain appear to have resolved without any specific  treatment.   CURRENT MEDICATIONS:  Caltrate daily, multivitamin daily.   MEDICATION ALLERGIES:  ANTI-INFLAMMATORY DRUGS.   PHYSICAL EXAMINATION:  No acute distress.  Weight 163 pounds, blood  pressure is 110/66, pulse 76 and regular.  She is not re-examined.   ASSESSMENT AND PLAN:  Elevated transaminases with an echodense liver on  abdominal ultrasound and a liver biopsy showing mild macrovesicular  steatosis and mild chronic hepatitis.  I suspect this is fatty liver and  she appears to have very mild steatohepatitis as well but this was not  specifically mentioned on the biopsy report. The liver biopsy did not  show any evidence of fibrosis and the inflammatory component was  minimal, which are all excellent prognostic signs. I have asked Dr. Claudette Laws to reveiw the liver biopsy and I await his opinion.  Her goals  for manangement are a long-term weight loss program to obtain a normal  body weight and to maintain a low-fat diet.  Optimal management of her  hyperlipidemia per Dr. Arnoldo Morale.  It is safe to resume  lipid-lowering  medications if this is clinically indicated.  I will defer to Dr.  Arnoldo Morale.  I would suggest that she have hepatic function tests performed  approximately every 3 months.  I will see her back for followup in 1  year.     Pricilla Riffle. Fuller Plan, MD, [facility]  Electronically Signed    MTS/MedQ  DD: 01/18/2007  DT: 01/18/2007  Job #: [id number]   cc:   Ricard Dillon, MD

## 2010-10-14 NOTE — Assessment & Plan Note (Signed)
[facility]                         GASTROENTEROLOGY OFFICE NOTE   Heather Townsend, Heather Townsend                       MRN:          [id number]  DATE:12/06/2006                            DOB:          1946-04-13    Heather Townsend returns for follow-up of abnormal liver function tests.  Additional serologies drawn at the last office visit were negative.  Her  prothrombin time was normal at 10.1 seconds with an INR of 0.8, AST  elevated at 133 and ALT elevated at 230.  The remainder of the liver  function tests were normal.  She relates that her reflux symptoms were  under good control while on Prevacid but since discontinuing Prevacid,  they have returned.  She also relates an intermittent left upper  quadrant pain that has been bothersome for the past few weeks.  It did  not appear to respond to Prevacid.  She has also noted some swelling in  her ankles and feet over the past few weeks. She has an appointment to  see Dr. Arnoldo Morale tomorrow.  She was advised by Dr. Arnoldo Morale following her  last office visit with him to discontinue all medications except for  Gaviscon.   CURRENT MEDICATION:  Gaviscon p.r.n.   ALLERGIES:  ANTI-INFLAMMATORY DRUGS.   PHYSICAL EXAMINATION:  GENERAL APPEARANCE:  No acute distress.  VITAL SIGNS:  Weight 164.4 pounds, blood pressure 136/80, pulse 80 and  regular.  HEENT:  Sclerae are anicteric.  Oropharynx clear.  CHEST:  Clear to auscultation bilaterally.  CARDIOVASCULAR:  Regular rate and rhythm without murmurs.  ABDOMEN:  Soft, nondistended.  Mild left upper quadrant tenderness to  deep palpation, no rebound or guarding.  No palpable organomegaly,  masses or hernias.  Normal active bowel sounds.   ASSESSMENT/PLAN:  1. Elevated transaminases with a prior history of fatty liver by      ultrasound.  Will obtain repeat liver function tests along with a      fasting lipid panel, prothrombin time, CBC with platelets and a      lipase  today.  If her transaminases remain elevated above the 2-3 X      normal range, we will plan to proceed with an abdominal ultrasound      guided core liver biopsy.  2. Gastroesophageal reflux disease and left upper quadrant pain.  Rule      out erosive esophagitis, gastritis and ulcer disease.  Resume      Prevacid 30 mg p.o. q.a.m. and antireflux measures.  Risks,      benefits, and alternatives to upper endoscopy with possible biopsy      discussed with the patient.  She consents to proceed.  This will be      scheduled electively.     Pricilla Riffle. Fuller Plan, MD, [facility]  Electronically Signed    MTS/MedQ  DD: 12/06/2006  DT: 12/06/2006  Job #: [id number]   cc:   Ricard Dillon, MD

## 2010-10-14 NOTE — Assessment & Plan Note (Signed)
[facility]ALTHC[facility]                            CARDIOLOGY OFFICE NOTE   ROMELL, CAVANAH                       MRN:          [id number]  DATE:07/21/2007                            DOB:          1945/12/05    Ms. Meiser is seen at the request of Dr. Arnoldo Morale.  She has had  palpitations, shortness of breath and atypical chest pain.   I believe she has been seen by Dr. Dannielle Burn in the past.  She has no  documented coronary disease.  There is a distant history of question  mitral valve prolapse.   The patient essentially wakes up in the morning, she has flip-flops in  heart.  There is no rapid element to it to suggest PSVT.  There has been  no syncope.  She feels some shortness of breath.  It sounds more  functional.  There is no pleuritic component.  She has no history of  chronic COPD.  She is a previous smoker but quit a long time ago.  There  is no active wheezing.  There is no PND, orthopnea.  No previous history  of heart failure.   The patient can do all the activities of daily living without shortness  of breath, when she gets up in the morning, she is fine.   Similarly, she can occasionally get pain in her chest.  It is atypical.  It is not exertional.  It is sharp.  It is fleeting, and it radiates to  the shoulders.  It is not associated with palpitations or shortness of  breath.   REVIEW OF SYSTEMS:  Otherwise negative.  She is not taking any  medications on a regular basis.   SHE DESCRIBES ALLERGIES TO ANTI-INFLAMMATORY MEDICATIONS.   Her past medical history is remarkable for an appendectomy, tubal  ligation, ganglion cyst in the left hand, and shoulder surgery.   The patient is divorced.  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  She  does not smoke or drink.  Her two children are older, and she has a good  relationship with them.   FAMILY HISTORY:  Is noncontributory.   Mother died at age 26 of old  age, father died at age 13 - cause unknown.   Her exam is remarkable for a middle-aged white female in no distress.  She has premature gray hair.  Blood pressure is 140/80, weight 164,  respiratory rate 14, pulse 73 and regular, afebrile.  HEENT:  Unremarkable.  Carotids are normal without bruit.  No lymphadenopathy, no  JVP  elevation.  LUNGS:  Are clear.  Good diaphragmatic motion.  No wheezing.  S1-S2, normal heart sounds.  No evidence of prolapse.  No murmur.  ABDOMEN:  Benign.  Bowel sounds positive.  No AAA, no  hepatosplenomegaly, normal hepatojugular reflux, no tenderness, no  bruit.  Distal pulses are intact.  No edema.  NEUROLOGICAL:  Nonfocal.  SKIN:  Warm and dry.   EKG is normal.   IMPRESSION:  1. Chest pain atypical.  I do not  think that the patient needs a      stress test at this time.  She has limited risk factors and a      normal EKG.  2. Dyspnea.  Again, appears functional.  Normal exam, normal EKG would      make left ventricular dysfunction unlikely.  We will check 2-D      echocardiogram to rule out structural heart disease and also follow      up on her history of mitral valve prolapse.  3. Palpitations.  Again, they sound benign and functional.  They are      not related to exertion.  They tend to recur at rest.  She is      leaving to see her daughter in [place] for week or two.  I told      her when she comes back to have her echo if she continues to have      palpitations.  She can asked for an event monitor, and we can      review this.  I would not treat him at this time.  She is due to      have her physical with Dr. Arnoldo Morale soon.  He should check routine      blood work including hematocrit, potassium, magnesium, TSH and T4.      I will see her back after her echocardiogram and possible event      monitor to make further recommendations, but I think her heart is      fine.     Wallis Bamberg. Johnsie Cancel, MD, [facility]   Electronically Signed    PCN/MedQ  DD: 07/21/2007  DT: 07/22/2007  Job #: [id number]

## 2010-10-17 NOTE — Assessment & Plan Note (Signed)
[facility]ALTHC[facility]                         GASTROENTEROLOGY OFFICE NOTE   Heather Townsend, Heather Townsend                       MRN:          [id number]  DATE:09/29/2006                            DOB:          Jun 07, 1945    REFERRING PHYSICIAN:  Ricard Dillon, MD   REASON FOR REFERRAL:  Abnormal liver function tests.   HPI: I previously evaluated Mrs. Hardwick in 1996.  She underwent upper  endoscopy for epigastric pain and left-sided chest pain.  She underwent  colonoscopy in June of 2006 for diarrhea, abdominal pain, bloating, and  a history of a paternal uncle and a paternal aunt with colon cancer.  The examination revealed only diverticulosis, and she was prescribed  Levsin sublingual for p.r.n. usage.  She has had a history of  intermittent reflux symptoms and she takes Prevacid on a p.r.n. basis.  She was found to have elevated liver function tests approximately 1 year  ago by Dr. Arnoldo Morale, and an ultrasound of the abdomen was performed at  that time, which revealed a mild increase in echodensity.  I do not have  any prior LFTs available at the time of this dictation, however, blood  work from August 30, 2006 revealed an AST of 164 and an ALT of 273.  The  remainder of the liver profile was negative.  Iron, hepatitis B surface  antigen, hepatitis B surface antibody, hepatitis C antibody, alpha 1  antitrypsin, ceruloplasmin, and smooth muscle antibodies were all  negative.  She describes very rare alcohol usage and may have a mixed  drink once or twice a year.  There is no family history of liver  disease.  She denies any right upper quadrant pain, jaundice, nausea, or  vomiting.  Her irritable bowel complaints are relatively well-controlled  with increased dietary fiber.   PAST MEDICAL HISTORY:  Status post tubal ligation.  Status post  appendectomy.  Status post tonsillectomy.  Status post uterine  suspension.  Left hand ganglion cyst.  Costochondritis.   Antral  gastritis.  GERD.  Diverticulosis.   CURRENT MEDICATIONS:  Listed on the chart, updated, and reviewed.   MEDICATION ALLERGIES:  ANTIINFLAMMATORY DRUGS.   SOCIAL HISTORY AND REVIEW OF SYSTEMS:  Per the hand-written form.   PHYSICAL EXAM:  In no acute distress.  Height 5 feet 4.5 inches, weight 165 pounds.  Blood pressure is 124/82,  pulse 72 and regular.  HEENT:  Anicteric sclerae.  Oropharynx clear.  CHEST:  Clear to auscultation bilaterally.  CARDIAC:  Regular rate and rhythm without murmurs appreciated.  ABDOMEN:  Soft, nontender, and nondistended.  Normoactive bowel sounds.  No palpable organomegaly, masses, or hernias.  Liver and spleen edge  approximately 12-13 cm by percussion and scratch testing in the right  midclavicular line.  NEUROLOGIC:  Alert and oriented x3.  Grossly nonfocal.   ASSESSMENT AND PLAN:  Elevated transaminases with a history of fatty  liver by ultrasound.  We will obtain additional blood work including an  ANA, AMA, ferritin, prothrombin time, and a repeat hepatic function  panel.  If the serologic workup is unremarkable, will consider repeating  her abdominal ultrasound and consider a core liver biopsy to further  evaluate.  She is advised to maintain a long-term low fat, weight loss  diet with the goal of achieving her ideal weight over the next 1 to 2  years.  She will follow up with Dr. Arnoldo Morale for further supervision of  her weight loss program.     Norberto Sorenson T. Fuller Plan, MD, [facility]  Electronically Signed    MTS/MedQ  DD: 09/29/2006  DT: 09/29/2006  Job #: [id number]   cc:   Ricard Dillon, MD

## 2010-10-17 NOTE — Assessment & Plan Note (Signed)
[facility]                                 ON-CALL NOTE   Heather, Townsend                       MRN:          [id number]  DATE:05/15/2006                            DOB:          04/09/1946    TIME:  6:34 a.m.   Phone number is [phone].   OBJECTIVE:  Patient has a sore throat.  Wants to be seen in Saturday  clinic.   Pharyngitis.   PLAN:  We will see her at 10:30 this morning.  Primary care Margia Wiesen is  Dr. Arnoldo Morale, Marliss Czar.     Modesto Charon, MD  Electronically Signed    RNS/MedQ  DD: 05/15/2006  DT: 05/15/2006  Job #: [id number]

## 2011-02-05 ENCOUNTER — Other Ambulatory Visit: Payer: Self-pay | Admitting: Internal Medicine

## 2011-03-16 LAB — CBC
HCT: 41.8
Hemoglobin: 14.7
MCHC: 35.2
RDW: 13.5

## 2011-04-27 ENCOUNTER — Encounter: Payer: Self-pay | Admitting: Internal Medicine

## 2011-04-27 ENCOUNTER — Ambulatory Visit (INDEPENDENT_AMBULATORY_CARE_PROVIDER_SITE_OTHER): Payer: BLUE CROSS/BLUE SHIELD | Admitting: Internal Medicine

## 2011-04-27 ENCOUNTER — Ambulatory Visit (INDEPENDENT_AMBULATORY_CARE_PROVIDER_SITE_OTHER)
Admission: RE | Admit: 2011-04-27 | Discharge: 2011-04-27 | Disposition: A | Discharge status: Home / Self Care | Payer: BLUE CROSS/BLUE SHIELD | Source: Ambulatory Visit | Attending: Internal Medicine | Admitting: Internal Medicine

## 2011-04-27 ENCOUNTER — Other Ambulatory Visit: Payer: Self-pay | Admitting: Internal Medicine

## 2011-04-27 VITALS — BP 120/80 | HR 88 | Temp 98.9°F | Wt 161.0 lb

## 2011-04-27 DIAGNOSIS — R509 Fever, unspecified: Secondary | ICD-10-CM

## 2011-04-27 DIAGNOSIS — J0120 Acute ethmoidal sinusitis, unspecified: Secondary | ICD-10-CM

## 2011-04-27 DIAGNOSIS — R51 Headache: Secondary | ICD-10-CM

## 2011-04-27 DIAGNOSIS — R82998 Other abnormal findings in urine: Secondary | ICD-10-CM

## 2011-04-27 DIAGNOSIS — K7689 Other specified diseases of liver: Secondary | ICD-10-CM

## 2011-04-27 DIAGNOSIS — N200 Calculus of kidney: Secondary | ICD-10-CM | POA: Insufficient documentation

## 2011-04-27 DIAGNOSIS — R8290 Unspecified abnormal findings in urine: Secondary | ICD-10-CM

## 2011-04-27 LAB — POCT URINALYSIS DIPSTICK
Bilirubin, UA: NEGATIVE
Leukocytes, UA: NEGATIVE
pH, UA: 6

## 2011-04-27 MED ORDER — LEVOFLOXACIN 750 MG PO TABS: 750.0000 mg | ORAL_TABLET | Freq: Every day | ORAL | Status: DC

## 2011-04-27 MED ORDER — TRAMADOL HCL 50 MG PO TABS: 50.0000 mg | ORAL_TABLET | Freq: Four times a day (QID) | ORAL | Status: DC | PRN

## 2011-04-27 NOTE — Progress Notes (Signed)
Subjective:    Patient ID: Heather Townsend, female    DOB: [birth year], 65 y.o.   MRN: [id number]  HPI  Patient   Of Dr Lovell Sheehan comes in today for an acute evaluationof a new problem.   She was in her usual state of health until she had the sudden onset of  FEVER 102  AND HA and vomiting and side back  pain  3 days ago and went to ed . In [facility].  Felt like a bladder infection ( Has of uti and sees Dr Cleatrice Burke in [facility] )  And told to go to ed.  So went to ed and did ct scan n of the abdomen and c x ray  clear .   Did blood tests WBC was 10,000 liver tests shows transaminase in the 100s.  She was given IV fluids and morphine and felt some better. Was not felt to have a bacterial infection. Was noted to have hematuria. She had renal stones on her CT scan. Since that time she's had no more vomiting and she thinks her fever has broke as of yesterday morning. No chills last night. However she is having persistent  Persistentshe calls severe  Headache   On the top of her head there is persistent not throbbing but does have.  Sensitivity to light .She now has upper respiratory congestion and a cough without chest pain or shortness of breath  No specific medication at this point. She has many allergies and intolerances.  Review of Systems As per history of present illness no vision change hearing change syncope unusual rashes diarrhea exposures that she is aware of she does not work with the public. No history of unusual headaches or recurrences.   Past history family history social history reviewed in the electronic medical record.     Objective:   Physical Exam WDWnN in mild distress  She is mildly ill and nontoxic WDWN in NAD  quiet respirations; mildly congested  somewhat hoarse. Non toxic . HEENT: Normocephalic ;atraumatic , Eyes;  PERRL, EOMs  Full, lids and conjunctiva clear fundi difficult to see,,Ears: no deformities, canals nl, TM landmarks normal, Nose: no deformity or discharge but  congested;face non- tender Mouth : OP clear without lesion or edema . Tongue is midline Neck: Supple without adenopathy or masses or bruits no meningismus Chest:  Clear to A&P without wheezes rales or rhonchi CV:  S1-S2 no gallops or murmurs peripheral perfusion is normal Skin :nl perfusion and no acute rashes  Abdomen:  Sof,t normal bowel sounds without hepatosplenomegaly, no guarding rebound or masses no CVA tenderness NEURO: oriented x 3 CN 3-12 appear intact. No focal muscle weakness or atrophy. DTRs symmetrical. Gait WNL.  Grossly non focal. No tremor or abnormal movement. Reviewed ed data available.       Assessment & Plan:  New onset fever headache and vomiting ( now better ) and ed eval in the setting of chornic hematuria renal stone and liver inflammation poss from steatosis- itis.   Think fever broke 24 hours ago but had a persistent new HA top of head and   Frontal with congestion and no meneingismal signs . Some photophobia no hx of these has.  Has se of many meds and want to avoid liver toxic agents   can try some Ultram if it helps as she has had this before CNS precautions. Disc with pcp dr Shela Commons and will get a scan to decideif antibiotic therapy indicated   for possible sinusitis  etc  And plan fu .  THen  appropriate followup.  This could be flu induced  Illness.   CT scan shows ethmoid sinusitis possibly acute on chronic. Some slight thickening of the sphenoid sinus otherwise no acute abnormalities. We'll begin her on Levaquin and have her followup with Dr. Lovell Sheehan within a week. She will call us if there is decompensation recurrent fever or worsening.   Total visit > 50% spent counseling and coordinating care    45 minutes

## 2011-04-27 NOTE — Patient Instructions (Signed)
Get ct scan to check for bacterial sinus infection. This could be a bad flu  And viral .  But I agree the severity is more than one would expect with this illness.  Pain med  As needed and fulids

## 2011-05-04 ENCOUNTER — Telehealth: Payer: Self-pay | Admitting: Internal Medicine

## 2011-05-04 MED ORDER — LEVOFLOXACIN 750 MG PO TABS: 750.0000 mg | ORAL_TABLET | Freq: Every day | ORAL | Status: AC

## 2011-05-04 NOTE — Telephone Encounter (Signed)
rx sent to pharmacy.  Pt aware.  

## 2011-05-04 NOTE — Telephone Encounter (Signed)
Per dr Yvonne Kendall refill levaquin for 7 more days

## 2011-05-04 NOTE — Telephone Encounter (Signed)
Refill Levaquin to [facility]. She stated that she is some better, but will need a little more. Thanks.

## 2011-07-22 ENCOUNTER — Encounter: Payer: Self-pay | Admitting: Gastroenterology

## 2011-07-22 ENCOUNTER — Ambulatory Visit (INDEPENDENT_AMBULATORY_CARE_PROVIDER_SITE_OTHER): Payer: BLUE CROSS/BLUE SHIELD | Admitting: Gastroenterology

## 2011-07-22 VITALS — BP 110/80 | HR 80 | Ht 64.0 in | Wt 165.2 lb

## 2011-07-22 DIAGNOSIS — R1013 Epigastric pain: Secondary | ICD-10-CM

## 2011-07-22 DIAGNOSIS — K219 Gastro-esophageal reflux disease without esophagitis: Secondary | ICD-10-CM

## 2011-07-22 MED ORDER — DEXLANSOPRAZOLE 60 MG PO CPDR: 60.0000 mg | DELAYED_RELEASE_CAPSULE | Freq: Every day | ORAL | Status: DC

## 2011-07-22 NOTE — Progress Notes (Signed)
History of Present Illness: This is a 66 year old female who relates worsening problems with epigastric pain, reflux symptoms and regurgitation. She has frequent nighttime regurgitation. She has a history of erosive esophagitis from prior endoscopy in 2008. She also has pain in her epigastrium and bilateral costochondral margins when she bends over. She states she underwent a CT scan at [facility] in January for kidney stones and was not told of any other abnormalities. Denies weight loss, constipation, diarrhea, change in stool caliber, melena, hematochezia, nausea, vomiting, dysphagia, chest pain.  Current Medications, Allergies, Past Medical History, Past Surgical History, Family History and Social History were reviewed in [employer] record.  Physical Exam: General: Well developed , well nourished, no acute distress Head: Normocephalic and atraumatic Eyes:  sclerae anicteric, EOMI Ears: Normal auditory acuity Mouth: No deformity or lesions Lungs: Clear throughout to auscultation Heart: Regular rate and rhythm; no murmurs, rubs or bruits Abdomen: Soft, mild epigastric tenderness without rebound or guarding and non distended. No masses, hepatosplenomegaly or hernias noted. Normal Bowel sounds Musculoskeletal: Symmetrical with no gross deformities  Pulses:  Normal pulses noted Extremities: No clubbing, cyanosis, edema or deformities noted Neurological: Alert oriented x 4, grossly nonfocal Psychological:  Alert and cooperative. Tearful during interview regarding her symptoms.  Assessment and Recommendations:  1. GERD, epigastric pain and costochondral margin pain. Attempt to obtain CT report from [facility]. Discontinue omeprazole and begin Dexilant 60 mg daily. Intensify all antireflux measures. Recommended to proceed with upper endoscopy for further evaluation but she declines and prefers to proceed with an upper GI series.  2. Chronically  elevated LFTs. See prior office note. Monitoring per Dr. Arnoldo Morale.

## 2011-07-22 NOTE — Patient Instructions (Addendum)
You have been scheduled for a Upper GI series at [facility] on Friday 07/24/11 at 10:00am. Nothing to eat or drink after midnight. Please arrive 15 minutes early for registration. Stop taking Prilosec and start Dexilant samples one tablet by mouth once daily and a prescription has been sent to your pharmacy.  cc: Benay Pillow, MD

## 2011-07-24 ENCOUNTER — Other Ambulatory Visit (HOSPITAL_COMMUNITY): Payer: BLUE CROSS/BLUE SHIELD

## 2011-07-30 ENCOUNTER — Ambulatory Visit (HOSPITAL_COMMUNITY)
Admission: RE | Admit: 2011-07-30 | Discharge: 2011-07-30 | Disposition: A | Discharge status: Home / Self Care | Payer: BC Managed Care – PPO | Source: Ambulatory Visit | Attending: Gastroenterology | Admitting: Gastroenterology

## 2011-07-30 DIAGNOSIS — K224 Dyskinesia of esophagus: Secondary | ICD-10-CM | POA: Insufficient documentation

## 2011-07-30 DIAGNOSIS — K219 Gastro-esophageal reflux disease without esophagitis: Secondary | ICD-10-CM

## 2011-07-30 DIAGNOSIS — R1013 Epigastric pain: Secondary | ICD-10-CM

## 2011-07-30 DIAGNOSIS — K449 Diaphragmatic hernia without obstruction or gangrene: Secondary | ICD-10-CM | POA: Insufficient documentation

## 2011-09-10 ENCOUNTER — Other Ambulatory Visit (INDEPENDENT_AMBULATORY_CARE_PROVIDER_SITE_OTHER): Payer: BC Managed Care – PPO

## 2011-09-10 DIAGNOSIS — Z0000 Encounter for general adult medical examination without abnormal findings: Secondary | ICD-10-CM

## 2011-09-10 LAB — BASIC METABOLIC PANEL
BUN: 13 mg/dL (ref 6–23)
Calcium: 9.5 mg/dL (ref 8.4–10.5)
GFR: 70.58 mL/min (ref >60.00)
Glucose, Bld: 107 mg/dL — ABNORMAL HIGH (ref 70–99)
Sodium: 142 mEq/L (ref 135–145)

## 2011-09-10 LAB — CBC WITH DIFFERENTIAL/PLATELET
Basophils Absolute: 0 10*3/uL (ref 0.0–0.1)
Hemoglobin: 15.6 g/dL — ABNORMAL HIGH (ref 12.0–15.0)
Lymphocytes Relative: 24.1 % (ref 12.0–46.0)
Monocytes Relative: 8.6 % (ref 3.0–12.0)
Platelets: 202 10*3/uL (ref 150.0–400.0)
RDW: 13.6 % (ref 11.5–14.6)
WBC: 6.1 10*3/uL (ref 4.5–10.5)

## 2011-09-10 LAB — LIPID PANEL
HDL: 44.5 mg/dL (ref >39.00)
Total CHOL/HDL Ratio: 5
Triglycerides: 102 mg/dL (ref 0.0–149.0)

## 2011-09-10 LAB — HEPATIC FUNCTION PANEL
AST: 195 U/L — ABNORMAL HIGH (ref 0–37)
Alkaline Phosphatase: 103 U/L (ref 39–117)
Total Bilirubin: 0.5 mg/dL (ref 0.3–1.2)

## 2011-09-10 LAB — MICROALBUMIN / CREATININE URINE RATIO
Creatinine,U: 216.2 mg/dL
Microalb, Ur: 0.8 mg/dL (ref 0.0–1.9)

## 2011-09-10 LAB — POCT URINALYSIS DIPSTICK
Bilirubin, UA: NEGATIVE
Glucose, UA: NEGATIVE
Ketones, UA: NEGATIVE
Spec Grav, UA: 1.025

## 2011-09-10 LAB — SEDIMENTATION RATE: Sed Rate: 35 mm/hr — ABNORMAL HIGH (ref 0–22)

## 2011-09-11 LAB — HEMOGLOBIN A1C: Hgb A1c MFr Bld: 6.9 % — ABNORMAL HIGH (ref 4.6–6.5)

## 2011-09-21 ENCOUNTER — Ambulatory Visit (INDEPENDENT_AMBULATORY_CARE_PROVIDER_SITE_OTHER): Payer: BC Managed Care – PPO | Admitting: Internal Medicine

## 2011-09-21 VITALS — BP 130/80 | HR 76 | Temp 98.2°F | Resp 16 | Ht 64.5 in | Wt 164.0 lb

## 2011-09-21 DIAGNOSIS — K7689 Other specified diseases of liver: Secondary | ICD-10-CM

## 2011-09-21 DIAGNOSIS — K760 Fatty (change of) liver, not elsewhere classified: Secondary | ICD-10-CM

## 2011-09-21 DIAGNOSIS — Z0000 Encounter for general adult medical examination without abnormal findings: Secondary | ICD-10-CM

## 2011-09-21 DIAGNOSIS — E119 Type 2 diabetes mellitus without complications: Secondary | ICD-10-CM

## 2011-09-21 NOTE — Progress Notes (Signed)
Subjective:    Patient ID: Heather Townsend, female    DOB: 07/04/1945, 66 y.o.   MRN: [id number]  HPI Patient presents for yearly examination.  She has comorbid findings of hyperlipidemia history of reflux esophagitis and a history of fatty liver.  We are both concerned with a history of elevated liver functions in the setting of fatty liver and the risk of cirrhosis she does have mild right upper quadrant discomfort but denies any nausea any diarrhea asteatosis with her symptoms of liver disease she has no jaundice or icterus   Review of Systems  Constitutional: Negative for activity change, appetite change and fatigue.  HENT: Negative for ear pain, congestion, neck pain, postnasal drip and sinus pressure.   Eyes: Negative for redness and visual disturbance.  Respiratory: Negative for cough, shortness of breath and wheezing.   Gastrointestinal: Negative for abdominal pain and abdominal distention.  Genitourinary: Negative for dysuria, frequency and menstrual problem.  Musculoskeletal: Negative for myalgias, joint swelling and arthralgias.  Skin: Negative for rash and wound.  Neurological: Negative for dizziness, weakness and headaches.  Hematological: Negative for adenopathy. Does not bruise/bleed easily.  Psychiatric/Behavioral: Negative for disturbed wake/sleep cycle and decreased concentration.   Past Medical History  Diagnosis Date  . Low back pain   . Hepatitis, chronic active     mmild chronic hepatitis-etiology not clear  . Diverticulosis   . Hyperlipidemia   . GERD (gastroesophageal reflux disease)   . Hiatal hernia   . Fatty infiltration of liver     History   Social History  . Marital Status: Divorced    Spouse Name: N/A    Number of Children: N/A  . Years of Education: N/A   Occupational History  . office manager    Social History Main Topics  . Smoking status: Former Smoker    Quit date: 06/01/1986  . Smokeless tobacco: Never Used   Comment: started in  1965-smoked less than 1 ppd  . Alcohol Use: Yes  . Drug Use: No  . Sexually Active: Not Currently   Other Topics Concern  . Not on file   Social History Narrative   Pt has children and lives alone    Past Surgical History  Procedure Date  . Tubal ligation   . Appendectomy   . Tonsillectomy   . Uterine suspension   . Ganglion cysts     left hand  . Shoulder surgery 2006    rt shoulder  . Hand surgery 1996    Family History  Problem Relation Age of Onset  . Heart disease Mother   . Diabetes Mother   . Colon cancer Paternal Uncle   . Pancreatic cancer Paternal Uncle   . Cancer Maternal Aunt     Stomach    Allergies  Allergen Reactions  . Celecoxib     REACTION: Swelling  . Codeine   . Ibuprofen   . Meloxicam     REACTION: Swelling  . Rofecoxib   . Sulfonamide Derivatives     Current Outpatient Prescriptions on File Prior to Visit  Medication Sig Dispense Refill  . HYDROcodone-acetaminophen (NORCO) 10-325 MG per tablet Take 1 tablet by mouth every 6 (six) hours as needed.        Marland Kitchen omeprazole (PRILOSEC) 40 MG capsule Take 1 capsule (40 mg total) by mouth daily.  30 capsule  11    BP 130/80  Pulse 76  Temp 98.2 F (36.8 C)  Resp 16  Ht 5' 4.5" (1.638 m)  Wt 164 lb (74.39 kg)  BMI 27.72 kg/m2       Objective:   Physical Exam  Nursing note and vitals reviewed. Constitutional: She is oriented to person, place, and time. She appears well-developed and well-nourished. No distress.  HENT:  Head: Normocephalic and atraumatic.  Right Ear: External ear normal.  Left Ear: External ear normal.  Nose: Nose normal.  Mouth/Throat: Oropharynx is clear and moist.  Eyes: Conjunctivae normal and EOM are normal. Pupils are equal, round, and reactive to light.  Neck: Normal range of motion. Neck supple. No JVD present. No tracheal deviation present. No thyromegaly present.  Cardiovascular: Normal rate, regular rhythm, normal heart sounds and intact distal pulses.     No murmur heard. Pulmonary/Chest: Effort normal and breath sounds normal. She has no wheezes. She exhibits no tenderness.  Abdominal: Soft. Bowel sounds are normal.  Musculoskeletal: Normal range of motion. She exhibits no edema and no tenderness.  Lymphadenopathy:    She has no cervical adenopathy.  Neurological: She is alert and oriented to person, place, and time. She has normal reflexes. No cranial nerve deficit.  Skin: Skin is warm and dry. She is not diaphoretic.  Psychiatric: She has a normal mood and affect. Her behavior is normal.          Assessment & Plan:   This is a routine physical examination for this healthy  Female. Reviewed all health maintenance protocols including mammography colonoscopy bone density and reviewed appropriate screening labs. Her immunization history was reviewed as well as her current medications and allergies refills of her chronic medications were given and the plan for yearly health maintenance was discussed all orders and referrals were made as appropriate.  Presents history of fatty liver and has increased liver functions this office visits she has had evaluations by several different gastroenterology groups including  [facility] and lower GI and the etiologies that have been forwarded include fatty liver as the most prominent probable etiology for her persistent elevation of liver functions.  She has gained weight she is off all in good glucose diet her A1c is increased and I believe that these are the contributory etiologies toward her liver function abnormality we will repeat a hemoglobin A1c and liver functions in 2 months she will attempt to lose weight and to markedly improve her diet if her A1c continues to be elevated we will treat this as frank diabetes

## 2011-10-27 ENCOUNTER — Other Ambulatory Visit: Payer: Self-pay

## 2011-10-27 MED ORDER — DEXLANSOPRAZOLE 60 MG PO CPDR: 60.0000 mg | DELAYED_RELEASE_CAPSULE | Freq: Every day | ORAL | Status: AC

## 2011-11-18 ENCOUNTER — Other Ambulatory Visit: Payer: BC Managed Care – PPO

## 2011-11-28 ENCOUNTER — Other Ambulatory Visit: Payer: Self-pay | Admitting: Internal Medicine

## 2011-11-30 ENCOUNTER — Ambulatory Visit: Payer: BC Managed Care – PPO | Admitting: Internal Medicine

## 2012-01-07 ENCOUNTER — Other Ambulatory Visit: Payer: BC Managed Care – PPO

## 2012-01-15 ENCOUNTER — Ambulatory Visit: Payer: BC Managed Care – PPO | Admitting: Internal Medicine

## 2012-02-26 ENCOUNTER — Encounter: Payer: Self-pay | Admitting: Internal Medicine

## 2012-02-26 NOTE — Patient Instructions (Signed)
The patient is instructed to continue all medications as prescribed. Schedule followup with check out clerk upon leaving the clinic  

## 2012-03-05 ENCOUNTER — Telehealth: Payer: Self-pay | Admitting: Internal Medicine

## 2012-03-05 MED ORDER — DEXLANSOPRAZOLE 60 MG PO CPDR: 60.0000 mg | DELAYED_RELEASE_CAPSULE | Freq: Every day | ORAL | Status: DC

## 2012-03-05 NOTE — Telephone Encounter (Signed)
Has been using Dexilant samples intermittently with good control of GERD sxs. Was given Rx at April visit but lost it  I have prescribed Dexilant for her today

## 2012-03-10 ENCOUNTER — Other Ambulatory Visit (INDEPENDENT_AMBULATORY_CARE_PROVIDER_SITE_OTHER): Payer: BC Managed Care – PPO

## 2012-03-10 DIAGNOSIS — E119 Type 2 diabetes mellitus without complications: Secondary | ICD-10-CM

## 2012-03-10 DIAGNOSIS — K7689 Other specified diseases of liver: Secondary | ICD-10-CM

## 2012-03-10 DIAGNOSIS — K760 Fatty (change of) liver, not elsewhere classified: Secondary | ICD-10-CM

## 2012-03-10 LAB — HEPATIC FUNCTION PANEL
AST: 199 U/L — ABNORMAL HIGH (ref 0–37)
Albumin: 3.6 g/dL (ref 3.5–5.2)
Total Protein: 7.5 g/dL (ref 6.0–8.3)

## 2012-03-10 LAB — HEMOGLOBIN A1C: Hgb A1c MFr Bld: 6.7 % — ABNORMAL HIGH (ref 4.6–6.5)

## 2012-03-17 ENCOUNTER — Ambulatory Visit (INDEPENDENT_AMBULATORY_CARE_PROVIDER_SITE_OTHER): Payer: BC Managed Care – PPO | Admitting: Internal Medicine

## 2012-03-17 VITALS — BP 136/78 | HR 72 | Temp 98.2°F | Resp 16 | Ht 64.5 in | Wt 164.0 lb

## 2012-03-17 DIAGNOSIS — K760 Fatty (change of) liver, not elsewhere classified: Secondary | ICD-10-CM

## 2012-03-17 DIAGNOSIS — K7689 Other specified diseases of liver: Secondary | ICD-10-CM

## 2012-03-17 NOTE — Patient Instructions (Signed)
The patient is instructed to continue all medications as prescribed. Schedule followup with check out clerk upon leaving the clinic  

## 2012-03-17 NOTE — Progress Notes (Signed)
  Subjective:    Patient ID: Heather Townsend, female    DOB: 09/12/1945, 66 y.o.   MRN: [id number]  HPI Follow up of liver enzymes and hx of fatty liver Complicated by need for lipid management   Review of Systems  Constitutional: Negative for activity change, appetite change and fatigue.  HENT: Negative for ear pain, congestion, neck pain, postnasal drip and sinus pressure.   Eyes: Negative for redness and visual disturbance.  Respiratory: Negative for cough, shortness of breath and wheezing.   Gastrointestinal: Negative for abdominal pain and abdominal distention.  Genitourinary: Negative for dysuria, frequency and menstrual problem.  Musculoskeletal: Negative for myalgias, joint swelling and arthralgias.  Skin: Negative for rash and wound.  Neurological: Negative for dizziness, weakness and headaches.  Hematological: Negative for adenopathy. Does not bruise/bleed easily.  Psychiatric/Behavioral: Negative for sleep disturbance and decreased concentration.       Objective:   Physical Exam  Constitutional: She is oriented to person, place, and time. She appears well-developed and well-nourished. No distress.       weight gain  HENT:  Head: Normocephalic and atraumatic.  Right Ear: External ear normal.  Left Ear: External ear normal.  Nose: Nose normal.  Mouth/Throat: Oropharynx is clear and moist.  Eyes: Conjunctivae normal and EOM are normal. Pupils are equal, round, and reactive to light.  Neck: Normal range of motion. Neck supple. No JVD present. No tracheal deviation present. No thyromegaly present.  Cardiovascular: Normal rate, regular rhythm, normal heart sounds and intact distal pulses.   No murmur heard. Pulmonary/Chest: Effort normal and breath sounds normal. She has no wheezes. She exhibits no tenderness.  Abdominal: Soft. Bowel sounds are normal.       Liver edge palpible  Musculoskeletal: Normal range of motion. She exhibits no edema and no tenderness.    Lymphadenopathy:    She has no cervical adenopathy.  Neurological: She is alert and oriented to person, place, and time. She has normal reflexes. No cranial nerve deficit.  Skin: Skin is warm and dry. She is not diaphoretic.  Psychiatric: She has a normal mood and affect. Her behavior is normal.          Assessment & Plan:  Fatty liver Weight management Referral back to hepatology 3 month LFT monitoring Avoid all tylenol product dexilant for GERD

## 2012-03-22 ENCOUNTER — Telehealth: Payer: Self-pay | Admitting: Internal Medicine

## 2012-03-22 ENCOUNTER — Ambulatory Visit (INDEPENDENT_AMBULATORY_CARE_PROVIDER_SITE_OTHER): Payer: BC Managed Care – PPO | Admitting: Family

## 2012-03-22 ENCOUNTER — Encounter: Payer: Self-pay | Admitting: Family

## 2012-03-22 VITALS — BP 130/78 | HR 86 | Temp 98.6°F | Wt 165.0 lb

## 2012-03-22 DIAGNOSIS — J309 Allergic rhinitis, unspecified: Secondary | ICD-10-CM

## 2012-03-22 DIAGNOSIS — J019 Acute sinusitis, unspecified: Secondary | ICD-10-CM

## 2012-03-22 MED ORDER — FLUTICASONE PROPIONATE 50 MCG/ACT NA SUSP: 2.0000 | Freq: Every day | NASAL | Status: DC

## 2012-03-22 NOTE — Telephone Encounter (Signed)
Caller: Bronwyn/Patient; Patient Name: Heather Townsend; PCP: Benay Pillow (Adults only); Best Callback Phone Number: [phone] Onset- 03/21/12 Pt has not taken temperature and does not feel she has a fever. Pt c/o of sinus headache and yellow drainage. She is requesting an antibiotic to be called in to pharmacy. Pt informed of policy that pt must be seen in office. Emergent s/s of URI protocol r/o. Pt to see provider within 72hrs. Appointment scheduled today with Megan Salon, [place]. Pt offered next available at 2pm, pt requested appointment a little later, scheduled at 3pm.

## 2012-03-22 NOTE — Progress Notes (Signed)
Subjective:    Patient ID: Heather Townsend, female    DOB: [birth year], 66 y.o.   MRN: [id number]  HPI 66 year old white female, nonsmoker, patient of Dr. Arnoldo Morale is in today with complaints of a scratchy throat, congestion x2 days. She denies any sinus pressure or pain no fever, muscle aches or pain. She has not taken any medication over-the-counter.   Review of Systems  Constitutional: Negative.  Negative for fever and chills.  HENT: Positive for congestion, sore throat, rhinorrhea, sneezing, postnasal drip and sinus pressure.   Respiratory: Negative.   Cardiovascular: Negative.   Musculoskeletal: Negative.   Skin: Negative.   Neurological: Negative.   Hematological: Negative.   Psychiatric/Behavioral: Negative.    Past Medical History  Diagnosis Date  . Low back pain   . Hepatitis, chronic active     mmild chronic hepatitis-etiology not clear  . Diverticulosis   . Hyperlipidemia   . GERD (gastroesophageal reflux disease)   . Hiatal hernia   . Fatty infiltration of liver     History   Social History  . Marital Status: Divorced    Spouse Name: N/A    Number of Children: N/A  . Years of Education: N/A   Occupational History  . office manager    Social History Main Topics  . Smoking status: Former Smoker    Quit date: 06/01/1986  . Smokeless tobacco: Never Used   Comment: started in 1965-smoked less than 1 ppd  . Alcohol Use: Yes  . Drug Use: No  . Sexually Active: Not Currently   Other Topics Concern  . Not on file   Social History Narrative   Pt has children and lives alone    Past Surgical History  Procedure Date  . Tubal ligation   . Appendectomy   . Tonsillectomy   . Uterine suspension   . Ganglion cysts     left hand  . Shoulder surgery 2006    rt shoulder  . Hand surgery 1996    Family History  Problem Relation Age of Onset  . Heart disease Mother   . Diabetes Mother   . Colon cancer Paternal Uncle   . Pancreatic cancer Paternal Uncle     . Cancer Maternal Aunt     Stomach    Allergies  Allergen Reactions  . Celecoxib     REACTION: Swelling  . Codeine   . Ibuprofen   . Meloxicam     REACTION: Swelling  . Rofecoxib   . Sulfonamide Derivatives     Current Outpatient Prescriptions on File Prior to Visit  Medication Sig Dispense Refill  . dexlansoprazole (DEXILANT) 60 MG capsule Take 1 capsule (60 mg total) by mouth daily.  30 capsule  5  . fluticasone (FLONASE) 50 MCG/ACT nasal spray Place 2 sprays into the nose daily.  16 g  6  . HYDROcodone-acetaminophen (NORCO) 10-325 MG per tablet Take 1 tablet by mouth every 6 (six) hours as needed.        . traMADol (ULTRAM) 50 MG tablet TAKE 1 TABLET EVERY 6 HOURS AS NEEDED FOR PAIN (MAX OF 8 TABS PER DAY)  30 tablet  0    BP 130/78  Pulse 86  Temp 98.6 F (37 C) (Oral)  Wt 165 lb (74.844 kg)  SpO2 98%chart    Objective:   Physical Exam  Constitutional: She is oriented to person, place, and time. She appears well-developed and well-nourished.  HENT:  Right Ear: External ear normal.  Left Ear: External  ear normal.  Nose: Nose normal.  Mouth/Throat: Oropharynx is clear and moist.  Neck: Normal range of motion. Neck supple.  Cardiovascular: Normal rate, regular rhythm and normal heart sounds.   Pulmonary/Chest: Effort normal.  Lymphadenopathy:    She has no cervical adenopathy.  Neurological: She is alert and oriented to person, place, and time.  Skin: Skin is warm and dry.  Psychiatric: She has a normal mood and affect.          Assessment & Plan:  Assessment: Early sinusitis, allergic rhinitis  Plan: Flonase 2 sprays in each nostril once a day. Allegra or Zyrtec 1 tablet daily. Patient call the office if symptoms worsen or persist. Recheck as scheduled and sooner when necessary.

## 2012-03-22 NOTE — Patient Instructions (Addendum)
OTC Allegra or Zyrtec once daily.   The patient is instructed to continue all medications as prescribed. Schedule followup with check out clerk upon leaving the clinic

## 2012-03-25 ENCOUNTER — Telehealth: Payer: Self-pay | Admitting: Internal Medicine

## 2012-03-25 MED ORDER — LEVOFLOXACIN 500 MG PO TABS: 500.0000 mg | ORAL_TABLET | Freq: Every day | ORAL | Status: DC

## 2012-03-25 NOTE — Telephone Encounter (Signed)
Pt said that she called [facility] but couldn't get nurse on phone. Pt needs abx for chest congestion. Pt said that she was prescribed and abx last year for acute sinus infection and she would like the same abx this year. Pls call in to [facility] on [address].

## 2012-03-25 NOTE — Telephone Encounter (Signed)
Called pt and informed her that med has been called in as noted.

## 2012-03-25 NOTE — Telephone Encounter (Signed)
Per dr Lovell Sheehan may have levaquin 500 qd for 7 days- called to pharmacy--please let pt know

## 2012-05-11 ENCOUNTER — Encounter: Payer: Self-pay | Admitting: Cardiovascular Disease

## 2012-05-11 ENCOUNTER — Ambulatory Visit (INDEPENDENT_AMBULATORY_CARE_PROVIDER_SITE_OTHER): Payer: BC Managed Care – PPO | Admitting: Cardiovascular Disease

## 2012-05-11 VITALS — BP 128/82 | HR 80 | Ht 64.5 in | Wt 164.0 lb

## 2012-05-11 DIAGNOSIS — R079 Chest pain, unspecified: Secondary | ICD-10-CM

## 2012-05-11 DIAGNOSIS — M542 Cervicalgia: Secondary | ICD-10-CM

## 2012-05-11 DIAGNOSIS — E785 Hyperlipidemia, unspecified: Secondary | ICD-10-CM

## 2012-05-11 NOTE — Assessment & Plan Note (Signed)
Cholesterol is at goal.  Continue current dose of statin and diet Rx.  No myalgias or side effects.  F/U  LFT's in 6 months. No results found for this basename: [facility]  Labs with Dr Arnoldo Morale

## 2012-05-11 NOTE — Patient Instructions (Addendum)
**Note De-identified  Obfuscation** Your physician has requested that you have a stress echocardiogram. For further information please visit [email or web address]. Please follow instruction sheet as given.  Your physician recommends that you continue on your current medications as directed. Please refer to the Current Medication list given to you today.  Your physician recommends that you schedule a follow-up appointment in: as needed   Exercise Stress Echocardiography An exercise echocardiogram is a test to see how well your heart can tolerate physical activity. You will need to walk on a treadmill for this test. An echocardiogram looks at how the heart moves. An exercise echocardiogram is also called a stress echocardiogram. While doing this test, your doctor will check to see if:  You have chest pain.  You have problems breathing.  You become dizzy or feel like you will pass out (faint).  You have problems with high or low blood pressure during the test. BEFORE THE TEST  Do not eat or drink anything for 6 hours before the test or as told by your doctor.  Do not eat or drink foods with caffeine for 24 hours before the test.  Do not  smoke on the day of your test.  Wear comfortable clothing and shoes you can walk in. You will wear a hospital gown from the waist up.  Ask your doctor what medicines you should or should not take before the test. TEST  You will be hooked up to a monitor to watch your heart rate.  An echocardiogram will be done before and after you walk on a treadmill. For an echocardiogram:  A clear gel is placed on your chest.  A wand-like device is moved over the gel on your chest.  Pictures are then taken using sound waves that come from the wand.  Your doctor will have you walk on a treadmill. How long you walk depends on your physical condition and the condition of your heart.  Your doctor will want to know right away if you have any of the following during the test:  Chest  pain.  Shortness of breath.  Dizziness. AFTER THE TEST Finding out the results of your test Ask when your test results will be ready. Make sure you get your test results. Document Released: 03/15/2009 Document Revised: 08/10/2011 Document Reviewed: 03/15/2009 Exit[employer] Patient Information 2013 [employer].  

## 2012-05-11 NOTE — Progress Notes (Signed)
Patient ID: Heather Townsend, female   DOB: [birth year], 66 y.o.   MRN: [id number] 66 yo referred by Dr Arnoldo Morale for atypical chest pain and dyspnea.  She has been seen in past for atypical pain with no ETT done in 2009.  Had echo with trivial MR and normal EF.  Normally active and dances regularly. Woke up Saturday with severe left sided neck pain lasted most of the day and radiated to chest.  Took ASA and put a pain patch on her neck Then awoke Sunday with dyspnea. No cough sputum, hemoptysis or fever no pleuritic component. Neck/chest pain improved but dyspnea persists.  Tried to dance Monday and couldn't do her usual routines.  No LE edema or pain No history of DVT.  Quit smoking in 85.    ROS: Denies fever, malais, weight loss, blurry vision, decreased visual acuity, cough, sputum, SOB, hemoptysis, pleuritic pain, palpitaitons, heartburn, abdominal pain, melena, lower extremity edema, claudication, or rash.  All other systems reviewed and negative   General: Affect appropriate Healthy:  appears stated age 58: normal Neck supple with no adenopathy JVP normal no bruits no thyromegaly Lungs clear with no wheezing and good diaphragmatic motion Heart:  S1/S2 no murmur,rub, gallop or click PMI normal Abdomen: benighn, BS positve, no tenderness, no AAA no bruit.  No HSM or HJR Distal pulses intact with no bruits No edema Neuro non-focal Skin warm and dry No muscular weakness  Medications Current Outpatient Prescriptions  Medication Sig Dispense Refill  . aspirin 81 MG tablet Take 81 mg by mouth as needed.      Marland Kitchen dexlansoprazole (DEXILANT) 60 MG capsule Take 60 mg by mouth 3 (three) times a week.      Marland Kitchen HYDROcodone-acetaminophen (NORCO) 10-325 MG per tablet Take 1 tablet by mouth every 6 (six) hours as needed.        . traMADol (ULTRAM) 50 MG tablet TAKE 1 TABLET EVERY 6 HOURS AS NEEDED FOR PAIN (MAX OF 8 TABS PER DAY)  30 tablet  0    Allergies Celecoxib; Codeine; Ibuprofen; Meloxicam;  Rofecoxib; and Sulfonamide derivatives  Family History: Family History  Problem Relation Age of Onset  . Heart disease Mother   . Diabetes Mother   . Colon cancer Paternal Uncle   . Pancreatic cancer Paternal Uncle   . Cancer Maternal Aunt     Stomach    Social History: History   Social History  . Marital Status: Divorced    Spouse Name: N/A    Number of Children: N/A  . Years of Education: N/A   Occupational History  . office manager    Social History Main Topics  . Smoking status: Former Smoker    Quit date: 06/01/1986  . Smokeless tobacco: Never Used     Comment: started in 1965-smoked less than 1 ppd  . Alcohol Use: Yes  . Drug Use: No  . Sexually Active: Not Currently   Other Topics Concern  . Not on file   Social History Narrative   Pt has children and lives alone    Electrocardiogram:  NSR rate 80 poor R wave progression no acute ischemia or RV strain  Assessment and Plan

## 2012-05-11 NOTE — Assessment & Plan Note (Signed)
Chest / Neck pain and dyspnea.  Clinical history low for PE and heart and lung exam normal.  F/U stress echo

## 2012-05-17 ENCOUNTER — Telehealth: Payer: Self-pay | Admitting: Internal Medicine

## 2012-05-17 NOTE — Telephone Encounter (Signed)
Patient Information:  Caller Name: Francena  Phone: [phone]  Patient: Heather Townsend, Heather Townsend  Gender: Female  DOB: 09/30/1945  Age: 66 Years  PCP: Benay Pillow (Adults only)  Office Follow Up:  Does the office need to follow up with this patient?: Yes  Instructions For The Office: had ED disp, but Shlonda doesn't want to go to the ED d/t the cost; says she has had food poisoning before and this is very similar to that   Symptoms  Reason For Call & Symptoms: says she has food poisoning; last vomit at 1330 which was yellow; last episode of diarrhea 1315; has even vomited an ice chip; started at 0330 this am  Reviewed Health History In EMR: Yes  Reviewed Medications In EMR: Yes  Reviewed Allergies In EMR: Yes  Reviewed Surgeries / Procedures: Yes  Date of Onset of Symptoms: 05/17/2012  Treatments Tried: laying down  Treatments Tried Worked: No  Guideline(s) Used:  Vomiting  Disposition Per Guideline:   Go to ED Now  Reason For Disposition Reached:   Severe vomiting (e.g., 6 or more times/day)  Advice Given:  For Non-stop Vomiting, Try Sleeping:  Try to go to sleep (Reason: sleep often empties the stomach and relieves the need to vomit).  When you awaken, resume drinking liquids. Water works best initially.  Clear Liquids:  Sip water or a rehydration drink (e.g., Gatorade or Powerade).  Clear Liquids:  Sip water or a rehydration drink (e.g., Gatorade or Powerade).  After 4 hours without vomiting, increase the amount.  Reassurance:  Adults with vomiting need to stay hydrated. This is the most important thing. If you don't drink and replace lost fluids, you may get dehydrated.  Call Back If:  Signs of dehydration occur  Patient Refused Recommendation:  Patient Will Follow Up With Office Later  would like to come to the office

## 2012-05-17 NOTE — Telephone Encounter (Signed)
Talked with pt and vomiting is slowing down- has phenergan and will take and will take i modium for diarrhea- clear liquid- will give ov for tomorrow pm,but pt states she will cancel in am if sx go away

## 2012-05-18 ENCOUNTER — Ambulatory Visit: Payer: BC Managed Care – PPO | Admitting: Family Medicine

## 2012-05-19 ENCOUNTER — Other Ambulatory Visit: Payer: BC Managed Care – PPO

## 2012-05-31 ENCOUNTER — Other Ambulatory Visit (HOSPITAL_COMMUNITY): Payer: BC Managed Care – PPO

## 2012-05-31 ENCOUNTER — Encounter: Payer: Self-pay | Admitting: Cardiovascular Disease

## 2012-05-31 ENCOUNTER — Ambulatory Visit (HOSPITAL_COMMUNITY): Payer: BC Managed Care – PPO | Attending: Cardiovascular Disease | Admitting: Radiology

## 2012-05-31 ENCOUNTER — Ambulatory Visit (HOSPITAL_COMMUNITY): Payer: BC Managed Care – PPO | Attending: Cardiovascular Disease

## 2012-05-31 DIAGNOSIS — M542 Cervicalgia: Secondary | ICD-10-CM

## 2012-05-31 DIAGNOSIS — E785 Hyperlipidemia, unspecified: Secondary | ICD-10-CM | POA: Insufficient documentation

## 2012-05-31 DIAGNOSIS — R079 Chest pain, unspecified: Secondary | ICD-10-CM

## 2012-05-31 DIAGNOSIS — R0989 Other specified symptoms and signs involving the circulatory and respiratory systems: Secondary | ICD-10-CM | POA: Insufficient documentation

## 2012-05-31 DIAGNOSIS — R0609 Other forms of dyspnea: Secondary | ICD-10-CM | POA: Insufficient documentation

## 2012-05-31 DIAGNOSIS — R072 Precordial pain: Secondary | ICD-10-CM | POA: Insufficient documentation

## 2012-05-31 NOTE — Progress Notes (Signed)
Stress Echocardiogram performed.  

## 2012-06-07 ENCOUNTER — Telehealth: Payer: Self-pay | Admitting: Cardiovascular Disease

## 2012-06-07 ENCOUNTER — Ambulatory Visit (INDEPENDENT_AMBULATORY_CARE_PROVIDER_SITE_OTHER): Payer: BC Managed Care – PPO | Admitting: Internal Medicine

## 2012-06-07 ENCOUNTER — Encounter: Payer: Self-pay | Admitting: Internal Medicine

## 2012-06-07 VITALS — BP 124/80 | HR 93 | Temp 98.4°F | Resp 18 | Wt 164.0 lb

## 2012-06-07 DIAGNOSIS — R51 Headache: Secondary | ICD-10-CM

## 2012-06-07 DIAGNOSIS — J0120 Acute ethmoidal sinusitis, unspecified: Secondary | ICD-10-CM

## 2012-06-07 MED ORDER — FLUTICASONE PROPIONATE 50 MCG/ACT NA SUSP: 2.0000 | Freq: Every day | NASAL | Status: DC

## 2012-06-07 NOTE — Telephone Encounter (Signed)
New Problem:    Patient returned your call about her Stress Echo results.  Please call back.

## 2012-06-07 NOTE — Patient Instructions (Signed)
   Acute sinusitis symptoms for less than 10 days are generally not helped by antibiotic therapy.  Use saline irrigation, warm  moist compresses and over-the-counter decongestants only as directed.  Call if there is no improvement in 5 to 7 days, or sooner if you develop increasing pain, fever, or any new symptoms.  Fluticasone nasal spray-use daily  Call or return to clinic prn if these symptoms worsen or fail to improve as anticipated.

## 2012-06-07 NOTE — Telephone Encounter (Signed)
LMTCB,./CY 

## 2012-06-07 NOTE — Progress Notes (Signed)
Subjective:    Patient ID: Heather Townsend, female    DOB: 06-12-1945, 67 y.o.   MRN: [id number]  HPI  67 year old patient who presents with a two-day history of pain primarily in the left postauricular area she describes some fullness about the left ear. 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 She denies any congestion localized pain or fever  Past Medical History  Diagnosis Date  . Low back pain   . Hepatitis, chronic active     mmild chronic hepatitis-etiology not clear  . Diverticulosis   . Hyperlipidemia   . GERD (gastroesophageal reflux disease)   . Hiatal hernia   . Fatty infiltration of liver     History   Social History  . Marital Status: Divorced    Spouse Name: N/A    Number of Children: N/A  . Years of Education: N/A   Occupational History  . office manager    Social History Main Topics  . Smoking status: Former Smoker    Quit date: 06/01/1986  . Smokeless tobacco: Never Used     Comment: started in 1965-smoked less than 1 ppd  . Alcohol Use: Yes  . Drug Use: No  . Sexually Active: Not Currently   Other Topics Concern  . Not on file   Social History Narrative   Pt has children and lives alone    Past Surgical History  Procedure Date  . Tubal ligation   . Appendectomy   . Tonsillectomy   . Uterine suspension   . Ganglion cysts     left hand  . Shoulder surgery 2006    rt shoulder  . Hand surgery 1996    Family History  Problem Relation Age of Onset  . Heart disease Mother   . Diabetes Mother   . Colon cancer Paternal Uncle   . Pancreatic cancer Paternal Uncle   . Cancer Maternal Aunt     Stomach    Allergies  Allergen Reactions  . Celecoxib     REACTION: Swelling  . Codeine   . Ibuprofen   . Meloxicam     REACTION: Swelling  . Rofecoxib   . Sulfonamide Derivatives     Current Outpatient Prescriptions on File Prior to Visit  Medication Sig Dispense Refill  . aspirin 81 MG  tablet Take 81 mg by mouth as needed.      Marland Kitchen dexlansoprazole (DEXILANT) 60 MG capsule Take 60 mg by mouth 3 (three) times a week.      Marland Kitchen HYDROcodone-acetaminophen (NORCO) 10-325 MG per tablet Take 1 tablet by mouth every 6 (six) hours as needed.        . traMADol (ULTRAM) 50 MG tablet TAKE 1 TABLET EVERY 6 HOURS AS NEEDED FOR PAIN (MAX OF 8 TABS PER DAY)  30 tablet  0    BP 124/80  Pulse 93  Temp 98.4 F (36.9 C) (Oral)  Resp 18  Wt 164 lb (74.39 kg)  SpO2 96%       Review of Systems  Constitutional: Negative.   HENT: Negative for hearing loss, congestion, sore throat, rhinorrhea, dental problem, sinus pressure and tinnitus.   Eyes: Negative for pain, discharge and visual disturbance.  Respiratory: Negative for cough and shortness of breath.   Cardiovascular: Negative for chest pain, palpitations and leg swelling.  Gastrointestinal: Negative for nausea, vomiting, abdominal pain, diarrhea, constipation, blood in stool and abdominal distention.  Genitourinary: Negative for dysuria, urgency, frequency, hematuria, flank  pain, vaginal bleeding, vaginal discharge, difficulty urinating, vaginal pain and pelvic pain.  Musculoskeletal: Negative for joint swelling, arthralgias and gait problem.  Skin: Negative for rash.  Neurological: Positive for headaches. Negative for dizziness, syncope, speech difficulty, weakness and numbness.  Hematological: Negative for adenopathy.  Psychiatric/Behavioral: Negative for behavioral problems, dysphoric mood and agitation. The patient is not nervous/anxious.        Objective:   Physical Exam  Constitutional: She is oriented to person, place, and time. She appears well-developed and well-nourished.  HENT:  Head: Normocephalic.  Right Ear: External ear normal.  Left Ear: External ear normal.  Mouth/Throat: Oropharynx is clear and moist.       Tympanic Membranes were normal No focal tenderness  Eyes: Conjunctivae normal and EOM are normal. Pupils  are equal, round, and reactive to light.  Neck: Normal range of motion. Neck supple. No thyromegaly present.  Cardiovascular: Normal rate, regular rhythm, normal heart sounds and intact distal pulses.   Pulmonary/Chest: Effort normal and breath sounds normal.  Abdominal: Soft. Bowel sounds are normal. She exhibits no mass. There is no tenderness.  Musculoskeletal: Normal range of motion.  Lymphadenopathy:    She has no cervical adenopathy.  Neurological: She is alert and oriented to person, place, and time.  Skin: Skin is warm and dry. No rash noted.  Psychiatric: She has a normal mood and affect. Her behavior is normal.          Assessment & Plan:   Nonspecific left hemicranial discomfort History of ethmoid sinusitis  We'll treat with decongestants expectorants and nasal irrigation will also treat with fluticasone nasal spray and observe. We'll consider empiric antibody therapy if there is any clinical worsening

## 2012-06-09 ENCOUNTER — Ambulatory Visit: Payer: BC Managed Care – PPO | Admitting: Internal Medicine

## 2012-06-14 ENCOUNTER — Encounter: Payer: Self-pay | Admitting: Internal Medicine

## 2012-06-14 NOTE — Telephone Encounter (Signed)
PT  AWARE OF STRESS ECHO RESULTS ./CY 

## 2012-06-16 ENCOUNTER — Other Ambulatory Visit: Payer: BC Managed Care – PPO

## 2012-06-30 ENCOUNTER — Other Ambulatory Visit: Payer: Self-pay

## 2012-07-11 ENCOUNTER — Other Ambulatory Visit (INDEPENDENT_AMBULATORY_CARE_PROVIDER_SITE_OTHER): Payer: Self-pay

## 2012-07-11 DIAGNOSIS — K760 Fatty (change of) liver, not elsewhere classified: Secondary | ICD-10-CM

## 2012-07-11 DIAGNOSIS — K7689 Other specified diseases of liver: Secondary | ICD-10-CM

## 2012-07-11 LAB — HEPATIC FUNCTION PANEL
ALT: 190 U/L — ABNORMAL HIGH (ref 0–35)
AST: 229 U/L — ABNORMAL HIGH (ref 0–37)
Total Protein: 7.7 g/dL (ref 6.0–8.3)

## 2012-07-18 ENCOUNTER — Other Ambulatory Visit: Payer: Self-pay

## 2012-07-22 ENCOUNTER — Encounter: Payer: Self-pay | Admitting: Internal Medicine

## 2012-07-22 ENCOUNTER — Ambulatory Visit (INDEPENDENT_AMBULATORY_CARE_PROVIDER_SITE_OTHER): Payer: Medicare Other | Admitting: Internal Medicine

## 2012-07-22 VITALS — BP 140/80 | HR 76 | Temp 98.3°F | Resp 16 | Ht 64.5 in | Wt 164.0 lb

## 2012-07-22 DIAGNOSIS — R7989 Other specified abnormal findings of blood chemistry: Secondary | ICD-10-CM

## 2012-07-22 LAB — IRON: Iron: 82 ug/dL (ref 42–145)

## 2012-07-22 MED ORDER — DEXLANSOPRAZOLE 60 MG PO CPDR: 60.0000 mg | DELAYED_RELEASE_CAPSULE | ORAL | Status: DC

## 2012-07-22 NOTE — Progress Notes (Signed)
Subjective:    Patient ID: Heather Townsend, female    DOB: [birth year], 67 y.o.   MRN: [id number]  HPI The pateint has been follow for hepatitis of unknown etiology. Was seen at [facility] and [place]. Last Biopsy "bile duct" was hit and she refused another Has been monitored by GI    Review of Systems  Constitutional: Negative for activity change, appetite change and fatigue.  HENT: Negative for ear pain, congestion, neck pain, postnasal drip and sinus pressure.   Eyes: Negative for redness and visual disturbance.  Respiratory: Negative for cough, shortness of breath and wheezing.   Gastrointestinal: Negative for abdominal pain and abdominal distention.  Genitourinary: Negative for dysuria, frequency and menstrual problem.  Musculoskeletal: Negative for myalgias, joint swelling and arthralgias.  Skin: Negative for rash and wound.  Neurological: Negative for dizziness, weakness and headaches.  Hematological: Negative for adenopathy. Does not bruise/bleed easily.  Psychiatric/Behavioral: Negative for sleep disturbance and decreased concentration.       Past Medical History  Diagnosis Date  . Low back pain   . Hepatitis, chronic active     mmild chronic hepatitis-etiology not clear  . Diverticulosis   . Hyperlipidemia   . GERD (gastroesophageal reflux disease)   . Hiatal hernia   . Fatty infiltration of liver     History   Social History  . Marital Status: Divorced    Spouse Name: N/A    Number of Children: N/A  . Years of Education: N/A   Occupational History  . office manager    Social History Main Topics  . Smoking status: Former Smoker    Quit date: 06/01/1986  . Smokeless tobacco: Never Used     Comment: started in 1965-smoked less than 1 ppd  . Alcohol Use: Yes  . Drug Use: No  . Sexually Active: Not Currently   Other Topics Concern  . Not on file   Social History Narrative   Pt has children and lives alone    Past Surgical History  Procedure  Laterality Date  . Tubal ligation    . Appendectomy    . Tonsillectomy    . Uterine suspension    . Ganglion cysts      left hand  . Shoulder surgery  2006    rt shoulder  . Hand surgery  1996    Family History  Problem Relation Age of Onset  . Heart disease Mother   . Diabetes Mother   . Colon cancer Paternal Uncle   . Pancreatic cancer Paternal Uncle   . Cancer Maternal Aunt     Stomach    Allergies  Allergen Reactions  . Celecoxib     REACTION: Swelling  . Codeine   . Ibuprofen   . Meloxicam     REACTION: Swelling  . Rofecoxib   . Sulfonamide Derivatives     Current Outpatient Prescriptions on File Prior to Visit  Medication Sig Dispense Refill  . aspirin 81 MG tablet Take 81 mg by mouth as needed.      Marland Kitchen HYDROcodone-acetaminophen (NORCO) 10-325 MG per tablet Take 1 tablet by mouth every 6 (six) hours as needed.         No current facility-administered medications on file prior to visit.    BP 140/80  Pulse 76  Temp(Src) 98.3 F (36.8 C)  Resp 16  Ht 5' 4.5" (1.638 m)  Wt 164 lb (74.39 kg)  BMI 27.73 kg/m2    Objective:   Physical Exam  Nursing note and vitals reviewed. Constitutional: She is oriented to person, place, and time. She appears well-developed and well-nourished. No distress.  HENT:  Head: Normocephalic and atraumatic.  Right Ear: External ear normal.  Left Ear: External ear normal.  Nose: Nose normal.  Mouth/Throat: Oropharynx is clear and moist.  Eyes: Conjunctivae and EOM are normal. Pupils are equal, round, and reactive to light.  Neck: Normal range of motion. Neck supple. No JVD present. No tracheal deviation present. No thyromegaly present.  Cardiovascular: Normal rate, regular rhythm, normal heart sounds and intact distal pulses.   No murmur heard. Pulmonary/Chest: Effort normal and breath sounds normal. She has no wheezes. She exhibits no tenderness.  Abdominal: Soft. Bowel sounds are normal.  Musculoskeletal: Normal range of  motion. She exhibits no edema and no tenderness.  Lymphadenopathy:    She has no cervical adenopathy.  Neurological: She is alert and oriented to person, place, and time. She has normal reflexes. No cranial nerve deficit.  Skin: Skin is warm and dry. She is not diaphoretic.  Psychiatric: She has a normal mood and affect. Her behavior is normal.          Assessment & Plan:  Will ultrasound liver and repeat screening for copper and iron  Syndromes  And will recheck A1antitrypsyn and Ca 19-9 Has not been to the specialist in two years and will refer back to jhepatology will US liver  Prior to referral ALT down but AP is up for first time?

## 2012-07-22 NOTE — Patient Instructions (Signed)
The patient is instructed to continue all medications as prescribed. Schedule followup with check out clerk upon leaving the clinic  

## 2012-07-22 NOTE — Progress Notes (Signed)
Pt has ov 2-21 dr Lovell Sheehan will discuss then

## 2012-07-23 LAB — ALPHA-1-ANTITRYPSIN: A-1 Antitrypsin, Ser: 165 mg/dL (ref 90–200)

## 2012-07-28 ENCOUNTER — Ambulatory Visit
Admission: RE | Admit: 2012-07-28 | Discharge: 2012-07-28 | Disposition: A | Discharge status: Home / Self Care | Payer: BC Managed Care – PPO | Source: Ambulatory Visit | Attending: Internal Medicine | Admitting: Internal Medicine

## 2012-08-12 ENCOUNTER — Telehealth: Payer: Self-pay | Admitting: Internal Medicine

## 2012-08-12 NOTE — Telephone Encounter (Signed)
Copper iron and 1 antitrypsin were normal Ultrasound shows persistent fatty infiltration of the liver There are new drugs for fatty liver and I want to refer her to hepatology Set her up to see the [facility] hepatologist here in [place]

## 2012-08-12 NOTE — Telephone Encounter (Signed)
Patient is aware and referral request sent 

## 2012-08-12 NOTE — Telephone Encounter (Signed)
Pt is calling requesting blood work/ultrasound results

## 2012-08-15 ENCOUNTER — Encounter: Payer: Self-pay | Admitting: Internal Medicine

## 2012-11-08 ENCOUNTER — Other Ambulatory Visit (HOSPITAL_COMMUNITY): Payer: Self-pay | Admitting: Internal Medicine

## 2012-11-08 DIAGNOSIS — K759 Inflammatory liver disease, unspecified: Secondary | ICD-10-CM

## 2012-11-28 ENCOUNTER — Other Ambulatory Visit (INDEPENDENT_AMBULATORY_CARE_PROVIDER_SITE_OTHER): Payer: BC Managed Care – PPO

## 2012-11-28 DIAGNOSIS — Z0000 Encounter for general adult medical examination without abnormal findings: Secondary | ICD-10-CM

## 2012-11-28 LAB — LIPID PANEL
HDL: 41.7 mg/dL (ref >39.00)
Total CHOL/HDL Ratio: 5
VLDL: 23.6 mg/dL (ref 0.0–40.0)

## 2012-11-28 LAB — CBC WITH DIFFERENTIAL/PLATELET
Basophils Absolute: 0 10*3/uL (ref 0.0–0.1)
Basophils Relative: 0.5 % (ref 0.0–3.0)
Eosinophils Absolute: 0.3 10*3/uL (ref 0.0–0.7)
MCHC: 33.8 g/dL (ref 30.0–36.0)
MCV: 94.6 fl (ref 78.0–100.0)
Monocytes Absolute: 0.5 10*3/uL (ref 0.1–1.0)
Neutro Abs: 3.9 10*3/uL (ref 1.4–7.7)
Neutrophils Relative %: 63 % (ref 43.0–77.0)
RBC: 4.81 Mil/uL (ref 3.87–5.11)
RDW: 14 % (ref 11.5–14.6)

## 2012-11-28 LAB — POCT URINALYSIS DIPSTICK
Leukocytes, UA: NEGATIVE
Nitrite, UA: NEGATIVE
Protein, UA: NEGATIVE
Urobilinogen, UA: 0.2
pH, UA: 5

## 2012-11-28 LAB — BASIC METABOLIC PANEL
CO2: 28 mEq/L (ref 19–32)
Calcium: 9.9 mg/dL (ref 8.4–10.5)
Chloride: 105 mEq/L (ref 96–112)
Creatinine, Ser: 0.7 mg/dL (ref 0.4–1.2)
Glucose, Bld: 144 mg/dL — ABNORMAL HIGH (ref 70–99)

## 2012-11-28 LAB — TSH: TSH: 5.93 u[IU]/mL — ABNORMAL HIGH (ref 0.35–5.50)

## 2012-11-28 LAB — HEPATIC FUNCTION PANEL
Albumin: 3.8 g/dL (ref 3.5–5.2)
Total Protein: 7.8 g/dL (ref 6.0–8.3)

## 2012-11-28 LAB — LDL CHOLESTEROL, DIRECT: Direct LDL: 148.3 mg/dL

## 2012-11-29 LAB — CA 125: CA 125: 9.6 U/mL (ref 0.0–30.2)

## 2012-11-29 LAB — VITAMIN D 25 HYDROXY (VIT D DEFICIENCY, FRACTURES): Vit D, 25-Hydroxy: 41 ng/mL (ref 30–89)

## 2012-11-30 ENCOUNTER — Other Ambulatory Visit: Payer: Self-pay | Admitting: Radiology

## 2012-11-30 ENCOUNTER — Encounter (HOSPITAL_COMMUNITY): Payer: Self-pay | Admitting: Pharmacy Technician

## 2012-12-05 ENCOUNTER — Encounter: Payer: Self-pay | Admitting: Internal Medicine

## 2012-12-07 ENCOUNTER — Encounter (HOSPITAL_COMMUNITY): Payer: Self-pay

## 2012-12-07 ENCOUNTER — Ambulatory Visit (HOSPITAL_COMMUNITY)
Admission: RE | Admit: 2012-12-07 | Discharge: 2012-12-07 | Disposition: A | Discharge status: Home / Self Care | Payer: BC Managed Care – PPO | Source: Ambulatory Visit | Attending: Internal Medicine | Admitting: Internal Medicine

## 2012-12-07 ENCOUNTER — Ambulatory Visit (HOSPITAL_COMMUNITY): Admission: RE | Admit: 2012-12-07 | Payer: BC Managed Care – PPO | Source: Ambulatory Visit

## 2012-12-07 DIAGNOSIS — K5730 Diverticulosis of large intestine without perforation or abscess without bleeding: Secondary | ICD-10-CM | POA: Insufficient documentation

## 2012-12-07 DIAGNOSIS — Z87891 Personal history of nicotine dependence: Secondary | ICD-10-CM | POA: Insufficient documentation

## 2012-12-07 DIAGNOSIS — K7689 Other specified diseases of liver: Secondary | ICD-10-CM | POA: Insufficient documentation

## 2012-12-07 DIAGNOSIS — K759 Inflammatory liver disease, unspecified: Secondary | ICD-10-CM

## 2012-12-07 DIAGNOSIS — K738 Other chronic hepatitis, not elsewhere classified: Secondary | ICD-10-CM | POA: Insufficient documentation

## 2012-12-07 DIAGNOSIS — K219 Gastro-esophageal reflux disease without esophagitis: Secondary | ICD-10-CM | POA: Insufficient documentation

## 2012-12-07 DIAGNOSIS — E785 Hyperlipidemia, unspecified: Secondary | ICD-10-CM | POA: Insufficient documentation

## 2012-12-07 DIAGNOSIS — K449 Diaphragmatic hernia without obstruction or gangrene: Secondary | ICD-10-CM | POA: Insufficient documentation

## 2012-12-07 LAB — CBC
Hemoglobin: 15.1 g/dL — ABNORMAL HIGH (ref 12.0–15.0)
MCH: 31.7 pg (ref 26.0–34.0)
MCHC: 34.8 g/dL (ref 30.0–36.0)
Platelets: 153 10*3/uL (ref 150–400)
RDW: 13.4 % (ref 11.5–15.5)

## 2012-12-07 LAB — PROTIME-INR: Prothrombin Time: 13.4 seconds (ref 11.6–15.2)

## 2012-12-07 MED ORDER — OXYCODONE HCL 5 MG PO TABS
5.0000 mg | ORAL_TABLET | Freq: Once | ORAL | Status: AC
  Administered 2012-12-07: 5 mg via ORAL

## 2012-12-07 MED ORDER — FENTANYL CITRATE 0.05 MG/ML IJ SOLN
INTRAMUSCULAR | Status: AC | PRN
  Administered 2012-12-07: 50 ug via INTRAVENOUS
  Administered 2012-12-07: 25 ug via INTRAVENOUS
  Administered 2012-12-07: 50 ug via INTRAVENOUS

## 2012-12-07 MED ORDER — OXYCODONE HCL 5 MG PO TABS
5.0000 mg | ORAL_TABLET | ORAL | Status: DC | PRN
  Administered 2012-12-07: 5 mg via ORAL

## 2012-12-07 MED ORDER — MIDAZOLAM HCL 2 MG/2ML IJ SOLN
INTRAMUSCULAR | Status: AC | PRN
  Administered 2012-12-07: 2 mg via INTRAVENOUS

## 2012-12-07 MED ORDER — SODIUM CHLORIDE 0.9 % IV SOLN: Freq: Once | INTRAVENOUS | Status: DC

## 2012-12-07 MED ORDER — OXYCODONE HCL 5 MG PO TABS
ORAL_TABLET | ORAL | Status: AC
  Filled 2012-12-07: qty 1

## 2012-12-07 MED ORDER — MIDAZOLAM HCL 2 MG/2ML IJ SOLN
INTRAMUSCULAR | Status: AC
  Filled 2012-12-07: qty 6

## 2012-12-07 MED ORDER — OXYCODONE-ACETAMINOPHEN 5-325 MG PO TABS
ORAL_TABLET | ORAL | Status: AC
  Filled 2012-12-07: qty 1

## 2012-12-07 MED ORDER — FENTANYL CITRATE 0.05 MG/ML IJ SOLN
INTRAMUSCULAR | Status: AC
  Filled 2012-12-07: qty 4

## 2012-12-07 NOTE — Procedures (Signed)
US guided liver biopsy.  No immediate complication.  2 cores obtained.

## 2012-12-07 NOTE — ED Notes (Signed)
Pt c/o right shoulder pain of 9 /10 after Liver biopsy.  Pain medication ordered

## 2012-12-07 NOTE — Progress Notes (Signed)
PAM TURPIN PA NOTIFIED OF PAIN AND ORDER NOTED

## 2012-12-07 NOTE — H&P (Signed)
Heather Townsend is an 67 y.o. female.   Chief Complaint: "I am here for a liver biopsy"  HPI: The patient states that she has had a liver biopsy in 2008 when her lab work was abnormal during a routine physical. She denies any abdominal pain, easily bruising or bleeding or yellowing of her skin or eyes. She states that she had multiple physicians review her previous results and it was determined that the sample was inadequate. The patient then had another liver biopsy in 2009 which was unsuccessful and complicated by common bile duct perforation. She recently has seen a liver specialist who has been monitoring her lab work and her LFT's are still elevated. She states she is here today for an elective liver biopsy. She denies any recent illness, fever, chills.   Past Medical History  Diagnosis Date  . Low back pain   . Hepatitis, chronic active     mmild chronic hepatitis-etiology not clear  . Diverticulosis   . Hyperlipidemia   . GERD (gastroesophageal reflux disease)   . Hiatal hernia   . Fatty infiltration of liver     Past Surgical History  Procedure Laterality Date  . Tubal ligation    . Appendectomy    . Tonsillectomy    . Uterine suspension    . Ganglion cysts      left hand  . Shoulder surgery  2006    rt shoulder  . Hand surgery  1996    Family History  Problem Relation Age of Onset  . Heart disease Mother   . Diabetes Mother   . Colon cancer Paternal Uncle   . Pancreatic cancer Paternal Uncle   . Cancer Maternal Aunt     Stomach   Social History:  reports that she quit smoking about 26 years ago. She has never used smokeless tobacco. She reports that  drinks alcohol. She reports that she does not use illicit drugs.  Allergies:  Allergies  Allergen Reactions  . Celecoxib     REACTION: Swelling  . Codeine   . Ibuprofen   . Meloxicam     REACTION: Swelling  . Rofecoxib   . Sulfonamide Derivatives      (Not in a hospital admission)  No results found for this  or any previous visit (from the past 48 hour(s)). No results found.  Review of Systems  Constitutional: Negative for fever and chills.  Respiratory: Negative for shortness of breath and wheezing.   Cardiovascular: Negative for chest pain.  Gastrointestinal: Negative for abdominal pain.  Skin: Negative for rash.  Endo/Heme/Allergies: Does not bruise/bleed easily.    Vitals per nursing chart. Physical Exam  Constitutional: She is oriented to person, place, and time. She appears well-developed and well-nourished. No distress.  HENT:  Head: Normocephalic and atraumatic.  Eyes: EOM are normal.  Neck: Normal range of motion.  Cardiovascular: Normal rate, regular rhythm and normal heart sounds.   No murmur heard. Respiratory: Effort normal and breath sounds normal. No respiratory distress. She has no wheezes.  Musculoskeletal: Normal range of motion.  Neurological: She is alert and oriented to person, place, and time.  Skin: Skin is warm and dry. No rash noted. She is not diaphoretic. No erythema. No pallor.  Psychiatric: She has a normal mood and affect. Her behavior is normal.     Assessment/Plan Abnormal LFT's with history of inadequate liver biopsy in 2008 and unsuccessful liver biopsy in 2009. Will proceed with US guided random liver biopsy today. Patient is  aware of the risks and benefits of this procedure and all questions have been answered. Consent is signed and in chart.  Chandra Feger A 12/07/2012, 1:15 PM

## 2013-01-25 ENCOUNTER — Ambulatory Visit (INDEPENDENT_AMBULATORY_CARE_PROVIDER_SITE_OTHER): Payer: BC Managed Care – PPO | Admitting: Internal Medicine

## 2013-01-25 ENCOUNTER — Encounter: Payer: Self-pay | Admitting: Internal Medicine

## 2013-01-25 VITALS — BP 144/82 | HR 69 | Temp 98.1°F | Wt 160.0 lb

## 2013-01-25 DIAGNOSIS — Z886 Allergy status to analgesic agent status: Secondary | ICD-10-CM | POA: Insufficient documentation

## 2013-01-25 DIAGNOSIS — R21 Rash and other nonspecific skin eruption: Secondary | ICD-10-CM | POA: Insufficient documentation

## 2013-01-25 DIAGNOSIS — Z862 Personal history of diseases of the blood and blood-forming organs and certain disorders involving the immune mechanism: Secondary | ICD-10-CM

## 2013-01-25 DIAGNOSIS — Z882 Allergy status to sulfonamides status: Secondary | ICD-10-CM | POA: Insufficient documentation

## 2013-01-25 MED ORDER — PREDNISONE 10 MG PO TABS: ORAL_TABLET | ORAL | Status: DC

## 2013-01-25 NOTE — Progress Notes (Signed)
Chief Complaint  Patient presents with  . Rash    Has the rash on her face and abdomen.  First came on Monday morning.  . Facial Swelling    HPI: Patient comes in today for SDA for  new problem evaluation. PCP not available. Patient with evaluation for hepatitis of unknown cause. Had the onset 2 days ago of itchy red area on the right face with some swelling and some tenderness feeling without associated fever I symptoms URI or other systemic symptoms. Treatment Cortisone cream and  Alcohol water.   Benadryl. Reminiscent of the time she took Celebrex and was allergic to it. However now has some itchy spots on her right thorax it doesn't itch until she touches him in a couple on her arm. No new medicines exposures topicals reviewed. No history of the same otherwise. She did work in the yard over the weekend but just mowed the lawn be diffuse things nothing out of the ordinary.  ROS: See pertinent positives and negatives per HPI. She takes oxycodone a couple days a week gets itching at higher doses but this is different no recent med  Past Medical History  Diagnosis Date  . Low back pain   . Hepatitis, chronic active     mmild chronic hepatitis-etiology not clear  . Diverticulosis   . Hyperlipidemia   . GERD (gastroesophageal reflux disease)   . Hiatal hernia   . Fatty infiltration of liver     Family History  Problem Relation Age of Onset  . Heart disease Mother   . Diabetes Mother   . Colon cancer Paternal Uncle   . Pancreatic cancer Paternal Uncle   . Cancer Maternal Aunt     Stomach    History   Social History  . Marital Status: Divorced    Spouse Name: N/A    Number of Children: N/A  . Years of Education: N/A   Occupational History  . office manager    Social History Main Topics  . Smoking status: Former Smoker    Quit date: 06/01/1986  . Smokeless tobacco: Never Used     Comment: started in 1965-smoked less than 1 ppd  . Alcohol Use: Yes  . Drug Use: No  .  Sexual Activity: Not Currently   Other Topics Concern  . None   Social History Narrative   Pt has children and lives alone    Outpatient Encounter Prescriptions as of 01/25/2013  Medication Sig Dispense Refill  . dexlansoprazole (DEXILANT) 60 MG capsule Take 60 mg by mouth 2 (two) times a week.      Marland Kitchen oxyCODONE-acetaminophen (PERCOCET/ROXICET) 5-325 MG per tablet Take 0.5 tablets by mouth daily as needed for pain.      . predniSONE (DELTASONE) 10 MG tablet Take pills per day,6,6,6,4,4,4,2,2,2,1,1,1  40 tablet  0   No facility-administered encounter medications on file as of 01/25/2013.    EXAM:  BP 144/82  Pulse 69  Temp(Src) 98.1 F (36.7 C) (Oral)  Wt 160 lb (72.576 kg)  BMI 27.05 kg/m2  SpO2 97%  Body mass index is 27.05 kg/(m^2).  GENERAL: vitals reviewed and listed above, alert, oriented, appears well hydrated and in no acute distress she has some diffuse redness on the right cheek and jaw line without streaking or discrete line no papules a bit warm but no cellulitis eyes are clear head atraumatic OP clear TMs intact. NECK: no obvious masses on inspection palpation no adenopathy Skin sun changes right base cheek and jaw line  with vague erythema with no papules or vesicles. Right trunk with scattered papules no vesicles few papules on arm one on the left trunk. She is nonicteric. No acute joint swelling MS: moves all extremities without noticeable focal  abnormality PSYCH: pleasant and cooperative, no obvious depression or anxiety  ASSESSMENT AND PLAN:  Discussed the following assessment and plan:  Rash, skin - Consider contact / allergy reaction. Based on distribution timing uncertain cause  LIVER FUNCTION TESTS, ABNORMAL, HX OF - Basically mild hepatitis of unknown origin patient told it could be autoimmune no major dysfunction. Doubt it's related to rash  H/O allergy to sulfonamides  H/O allergy to nonsteroidal anti-inflammatory drug (NSAID) No contraindication to  prednisone based on history and contacts. Empiric treatment for possible contact dermatitis on face trunk and arms. Although bit atypical. No signs of infection at this time discussed medication she's never been on this before although her mother had a bad effect and ended up in the hospital because of this many years ago. That I think the benefit outweighs the risk based on what she has taken already for her rash. Close observation and followup if persistent and progressive or atypical. -Patient advised to return or notify health care team  if symptoms worsen or persist or new concerns arise.  Patient Instructions  this acts like an allergic reaction to something on the skin area . Continue antihistamine and add prednisone treatment.   Can decrease dose  Quicker    If doing well.     Neta Mends. Shan Valdes M.D.

## 2013-01-25 NOTE — Patient Instructions (Signed)
this acts like an allergic reaction to something on the skin area . Continue antihistamine and add prednisone treatment.   Can decrease dose  Quicker    If doing well.

## 2013-02-20 ENCOUNTER — Other Ambulatory Visit: Payer: Self-pay | Admitting: Internal Medicine

## 2013-02-20 DIAGNOSIS — K7460 Unspecified cirrhosis of liver: Secondary | ICD-10-CM

## 2013-03-01 ENCOUNTER — Ambulatory Visit
Admission: RE | Admit: 2013-03-01 | Discharge: 2013-03-01 | Disposition: A | Discharge status: Home / Self Care | Payer: Medicare Other | Source: Ambulatory Visit | Attending: Internal Medicine | Admitting: Internal Medicine

## 2013-03-01 DIAGNOSIS — K7460 Unspecified cirrhosis of liver: Secondary | ICD-10-CM

## 2013-03-06 ENCOUNTER — Ambulatory Visit (INDEPENDENT_AMBULATORY_CARE_PROVIDER_SITE_OTHER): Payer: BC Managed Care – PPO | Admitting: Internal Medicine

## 2013-03-06 ENCOUNTER — Encounter: Payer: Self-pay | Admitting: Internal Medicine

## 2013-03-06 VITALS — BP 126/80 | HR 72 | Temp 98.6°F | Resp 16 | Ht 64.5 in | Wt 156.0 lb

## 2013-03-06 DIAGNOSIS — K760 Fatty (change of) liver, not elsewhere classified: Secondary | ICD-10-CM

## 2013-03-06 DIAGNOSIS — K7689 Other specified diseases of liver: Secondary | ICD-10-CM

## 2013-03-06 DIAGNOSIS — Z0000 Encounter for general adult medical examination without abnormal findings: Secondary | ICD-10-CM

## 2013-03-06 NOTE — Patient Instructions (Addendum)
Consider gluten and dairy free avoiding "legums"  ( peanuts, pintos etc)   Concentrate on leans meats, eggs, fruits, veggies and tree nuts ( walnuts, pecans almonds) [place] yogut best   "paleo diet"  Practical paleo good book

## 2013-03-06 NOTE — Progress Notes (Signed)
  Subjective:    Patient ID: Heather Townsend, female    DOB: 09/15/1945, 67 y.o.   MRN: [id number]  HPI  cpx Lost weighrt and blood presure is down GERD Biopsy  Showed cirrosis Needed to have [place] every six months for screening Due colon Dr Russella Dar  ( EGD and Colon due)  Review of Systems  Constitutional: Negative for activity change, appetite change and fatigue.  HENT: Negative for ear pain, congestion, neck pain, postnasal drip and sinus pressure.   Eyes: Negative for redness and visual disturbance.  Respiratory: Negative for cough, shortness of breath and wheezing.   Gastrointestinal: Negative for abdominal pain and abdominal distention.  Genitourinary: Negative for dysuria, frequency and menstrual problem.  Musculoskeletal: Negative for myalgias, joint swelling and arthralgias.  Skin: Negative for rash and wound.  Neurological: Negative for dizziness, weakness and headaches.  Hematological: Negative for adenopathy. Does not bruise/bleed easily.  Psychiatric/Behavioral: Negative for sleep disturbance and decreased concentration.       Objective:   Physical Exam  Nursing note reviewed. Constitutional: She is oriented to person, place, and time. She appears well-developed and well-nourished. No distress.  HENT:  Head: Normocephalic and atraumatic.  Eyes: Conjunctivae and EOM are normal. Pupils are equal, round, and reactive to light.  Neck: Normal range of motion. Neck supple. No JVD present. No tracheal deviation present. No thyromegaly present.  Cardiovascular: Normal rate, regular rhythm, normal heart sounds and intact distal pulses.   No murmur heard. Pulmonary/Chest: Effort normal and breath sounds normal. She has no wheezes. She exhibits no tenderness.  Abdominal: Soft. Bowel sounds are normal.  Musculoskeletal: Normal range of motion. She exhibits no edema and no tenderness.  Lymphadenopathy:    She has no cervical adenopathy.  Neurological: She is alert and oriented to  person, place, and time. She has normal reflexes. No cranial nerve deficit.  Skin: Skin is warm and dry. She is not diaphoretic.  Psychiatric: She has a normal mood and affect. Her behavior is normal.          Assessment & Plan:  Fatty liver with cirrhosis Risk for cancer moderately high weight loss needed Refer to nutritionist for diet for fatty liver   This is a routine physical examination for this healthy  Female. Reviewed all health maintenance protocols including mammography colonoscopy bone density and reviewed appropriate screening labs. Her immunization history was reviewed as well as her current medications and allergies refills of her chronic medications were given and the plan for yearly health maintenance was discussed all orders and referrals were made as appropriate.

## 2013-03-08 ENCOUNTER — Ambulatory Visit (INDEPENDENT_AMBULATORY_CARE_PROVIDER_SITE_OTHER)
Admission: RE | Admit: 2013-03-08 | Discharge: 2013-03-08 | Disposition: A | Discharge status: Home / Self Care | Payer: Medicare Other | Source: Ambulatory Visit | Attending: Internal Medicine | Admitting: Internal Medicine

## 2013-03-08 DIAGNOSIS — Z0000 Encounter for general adult medical examination without abnormal findings: Secondary | ICD-10-CM | POA: Diagnosis not present

## 2013-04-04 ENCOUNTER — Encounter: Payer: Self-pay | Admitting: Gastroenterology

## 2013-04-04 ENCOUNTER — Ambulatory Visit (INDEPENDENT_AMBULATORY_CARE_PROVIDER_SITE_OTHER): Payer: BC Managed Care – PPO | Admitting: Gastroenterology

## 2013-04-04 VITALS — BP 110/62 | HR 80 | Ht 64.5 in | Wt 153.2 lb

## 2013-04-04 DIAGNOSIS — K219 Gastro-esophageal reflux disease without esophagitis: Secondary | ICD-10-CM

## 2013-04-04 DIAGNOSIS — Z800 Family history of malignant neoplasm of digestive organs: Secondary | ICD-10-CM

## 2013-04-04 DIAGNOSIS — K7581 Nonalcoholic steatohepatitis (NASH): Secondary | ICD-10-CM | POA: Insufficient documentation

## 2013-04-04 DIAGNOSIS — K7689 Other specified diseases of liver: Secondary | ICD-10-CM

## 2013-04-04 MED ORDER — PEG-KCL-NACL-NASULF-NA ASC-C 100 G PO SOLR: 1.0000 | Freq: Once | ORAL | Status: DC

## 2013-04-04 NOTE — Patient Instructions (Signed)
You have been scheduled for an endoscopy and colonoscopy with propofol. Please follow the written instructions given to you at your visit today. Please pick up your prep at the pharmacy within the next 1-3 days. If you use inhalers (even only as needed), please bring them with you on the day of your procedure.  Thank you for choosing me and [facility].  Venita Lick. Pleas Koch., MD., Clementeen Graham  cc: Eduardo Osier, MD

## 2013-04-04 NOTE — Progress Notes (Signed)
    History of Present Illness: This is a 67 -year-old female recently diagnosed with NASH related cirrhosis by [facility] liver care. She has chronic GERD which is stable and well controlled. Her last colonoscopy was performed 2006 and was unremarkable. Denies weight loss, abdominal pain, constipation, diarrhea, change in stool caliber, melena, hematochezia, nausea, vomiting, dysphagia, reflux symptoms, chest pain.  Current Medications, Allergies, Past Medical History, Past Surgical History, Family History and Social History were reviewed in [employer] record.  Physical Exam: General: Well developed , well nourished, no acute distress Head: Normocephalic and atraumatic Eyes:  sclerae anicteric, EOMI Ears: Normal auditory acuity Mouth: No deformity or lesions Lungs: Clear throughout to auscultation Heart: Regular rate and rhythm; no murmurs, rubs or bruits Abdomen: Soft, non tender and non distended. No masses, hepatosplenomegaly or hernias noted. Normal Bowel sounds Rectal: Deferred to colonoscopy Musculoskeletal: Symmetrical with no gross deformities  Pulses:  Normal pulses noted Extremities: No clubbing, cyanosis, edema or deformities noted Neurological: Alert oriented x 4, grossly nonfocal Psychological:  Alert and cooperative. Normal mood and affect  Assessment and Recommendations:  1. GERD. Continue Dexilant 60 mg daily and standard antireflux measures.  2. Family colon cancer in uncle and aunt. The risks, benefits, and alternatives to colonoscopy with possible biopsy and possible polypectomy were discussed with the patient and they consent to proceed.    3. 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 Dola Argyle. Ongoing follow up with Dr. Dola Argyle and [facility]. EGD for varices surveillance. The risks, benefits, and alternatives to endoscopy with possible biopsy and possible dilation were discussed with the patient and they consent to proceed.

## 2013-04-05 ENCOUNTER — Encounter: Payer: Self-pay | Admitting: Gastroenterology

## 2013-04-07 ENCOUNTER — Encounter: Payer: Self-pay | Admitting: *Deleted

## 2013-04-07 ENCOUNTER — Encounter: Payer: BC Managed Care – PPO | Attending: Internal Medicine | Admitting: *Deleted

## 2013-04-07 VITALS — Ht 64.5 in | Wt 153.5 lb

## 2013-04-07 DIAGNOSIS — Z713 Dietary counseling and surveillance: Secondary | ICD-10-CM | POA: Insufficient documentation

## 2013-04-07 DIAGNOSIS — K7689 Other specified diseases of liver: Secondary | ICD-10-CM | POA: Insufficient documentation

## 2013-04-07 DIAGNOSIS — K7581 Nonalcoholic steatohepatitis (NASH): Secondary | ICD-10-CM

## 2013-04-07 NOTE — Progress Notes (Signed)
Medical Nutrition Therapy:  Appt start time: 0915 end time:  1000.  Assessment:  Patient here today for education on diet for non-alcoholic, fatty liver with cirrhosis. She reports that she doesn't want a strict diet plan, but wants to know what foods to avoid or limit in the diet. She has been trying to lose weight for the last 3 months, and has lost about 17 pounds, including 3 pounds in the last month. She does report frustration that she is not losing weight as fast, but we discussed realistic expectations for weight loss, as she has already lost 10% of her starting body weight. Her goal weight is 140 pounds. She doesn't eat large meals, and fat intake is estimated to be within normal limits, but she does have a high intake of sweetened drinks (sugar in coffee, sweet tea).   MEDICATIONS: Reviewed   DIETARY INTAKE:   Usual eating pattern includes 2-3 meals and 2 snacks per day.  24-hr recall:  B ( AM): Eats out: Egg McMuffin OR gravy biscuit OR sausage and egg, sometimes coffee (cream, sugar) Snk ( AM): Nuts (walnuts, pecans), sweet tea  L ( PM): Sometimes skips, Baked sweet potato, grilled chicken salad, OR ribeye steak, sweet tea Snk ( PM): Nuts, apple D ( PM): Sometimes skips, Leftovers from lunch, OR Potatoes, carrots, greens Snk ( PM): None Beverages: Sweet tea, coffee  Usual physical activity: Dancing 1-2 hours, 2-3 days per week, walking stairs frequently at work  Estimated energy needs: 1200 calories 150 g carbohydrates 75 g protein 33 g fat  Progress Towards Goal(s):  In progress.   Nutritional Diagnosis:  NB-1.1 Food and nutrition-related knowledge deficit As related to fatty liver.  As evidenced by no prior education.    Intervention:  Nutrition counseling. Patient educated on a low fat, low sugar diet for fatty liver. We discussed foods high in fat, healthy fat choices, and ways to reduce sugar (fructose in the diet). We also reviewed strategies for weight loss.    Goals:  1. Limit fat intake, reduce intake of high fat meats and breakfast foods. Choose healthier fats.  2. Limit intake of sweetened drinks and sugar.  3. 0.5-1 pound weight loss per week. Goal weight: 140 pounds.  4. Continue exercising per current regimen.  Handouts given during visit include:  Fat-restricted nutrition therapy handout  Monitoring/Evaluation:  Dietary intake, exercise, and body weight prn.

## 2013-04-11 ENCOUNTER — Ambulatory Visit (AMBULATORY_SURGERY_CENTER): Payer: BC Managed Care – PPO | Admitting: Gastroenterology

## 2013-04-11 ENCOUNTER — Encounter: Payer: Self-pay | Admitting: Gastroenterology

## 2013-04-11 VITALS — BP 153/76 | HR 58 | Temp 96.8°F | Resp 16 | Ht 64.5 in | Wt 153.0 lb

## 2013-04-11 DIAGNOSIS — Z800 Family history of malignant neoplasm of digestive organs: Secondary | ICD-10-CM

## 2013-04-11 DIAGNOSIS — D126 Benign neoplasm of colon, unspecified: Secondary | ICD-10-CM

## 2013-04-11 MED ORDER — SODIUM CHLORIDE 0.9 % IV SOLN: 500.0000 mL | INTRAVENOUS | Status: DC

## 2013-04-11 NOTE — Patient Instructions (Addendum)
YOU HAD AN ENDOSCOPIC PROCEDURE TODAY AT THE [facility]: Refer to the procedure report that was given to you for any specific questions about what was found during the examination.  If the procedure report does not answer your questions, please call your gastroenterologist to clarify.  If you requested that your care partner not be given the details of your procedure findings, then the procedure report has been included in a sealed envelope for you to review at your convenience later.  YOU SHOULD EXPECT: Some feelings of bloating in the abdomen. Passage of more gas than usual.  Walking can help get rid of the air that was put into your GI tract during the procedure and reduce the bloating. If you had a lower endoscopy (such as a colonoscopy or flexible sigmoidoscopy) you may notice spotting of blood in your stool or on the toilet paper. If you underwent a bowel prep for your procedure, then you may not have a normal bowel movement for a few days.  DIET: Your first meal following the procedure should be a light meal and then it is ok to progress to your normal diet.  A half-sandwich or bowl of soup is an example of a good first meal.  Heavy or fried foods are harder to digest and may make you feel nauseous or bloated.  Likewise meals heavy in dairy and vegetables can cause extra gas to form and this can also increase the bloating.  Drink plenty of fluids but you should avoid alcoholic beverages for 24 hours.  ACTIVITY: Your care partner should take you home directly after the procedure.  You should plan to take it easy, moving slowly for the rest of the day.  You can resume normal activity the day after the procedure however you should NOT DRIVE or use heavy machinery for 24 hours (because of the sedation medicines used during the test).    SYMPTOMS TO REPORT IMMEDIATELY: A gastroenterologist can be reached at any hour.  During normal business hours, 8:30 AM to 5:00 PM Monday through Friday,  call [phone].  After hours and on weekends, please call the GI answering service at [phone] who will take a message and have the physician on call contact you.   Following lower endoscopy (colonoscopy or flexible sigmoidoscopy):  Excessive amounts of blood in the stool  Significant tenderness or worsening of abdominal pains  Swelling of the abdomen that is new, acute  Fever of 100F or higher  Following upper endoscopy (EGD)  Vomiting of blood or coffee ground material  New chest pain or pain under the shoulder blades  Painful or persistently difficult swallowing  New shortness of breath  Fever of 100F or higher  Black, tarry-looking stools  FOLLOW UP: If any biopsies were taken you will be contacted by phone or by letter within the next 1-3 weeks.  Call your gastroenterologist if you have not heard about the biopsies in 3 weeks.  Our staff will call the home number listed on your records the next business day following your procedure to check on you and address any questions or concerns that you may have at that time regarding the information given to you following your procedure. This is a courtesy call and so if there is no answer at the home number and we have not heard from you through the emergency physician on call, we will assume that you have returned to your regular daily activities without incident.  SIGNATURES/CONFIDENTIALITY: You and/or your care   partner have signed paperwork which will be entered into your electronic medical record.  These signatures attest to the fact that that the information above on your After Visit Summary has been reviewed and is understood.  Full responsibility of the confidentiality of this discharge information lies with you and/or your care-partner.YOU HAD AN ENDOSCOPIC PROCEDURE TODAY AT THE [facility]: Refer to the procedure report that was given to you for any specific questions about what was found during the examination.   If the procedure report does not answer your questions, please call your gastroenterologist to clarify.  If you requested that your care partner not be given the details of your procedure findings, then the procedure report has been included in a sealed envelope for you to review at your convenience later.  YOU SHOULD EXPECT: Some feelings of bloating in the abdomen. Passage of more gas than usual.  Walking can help get rid of the air that was put into your GI tract during the procedure and reduce the bloating. If you had a lower endoscopy (such as a colonoscopy or flexible sigmoidoscopy) you may notice spotting of blood in your stool or on the toilet paper. If you underwent a bowel prep for your procedure, then you may not have a normal bowel movement for a few days.  DIET: Your first meal following the procedure should be a light meal and then it is ok to progress to your normal diet.  A half-sandwich or bowl of soup is an example of a good first meal.  Heavy or fried foods are harder to digest and may make you feel nauseous or bloated.  Likewise meals heavy in dairy and vegetables can cause extra gas to form and this can also increase the bloating.  Drink plenty of fluids but you should avoid alcoholic beverages for 24 hours.  ACTIVITY: Your care partner should take you home directly after the procedure.  You should plan to take it easy, moving slowly for the rest of the day.  You can resume normal activity the day after the procedure however you should NOT DRIVE or use heavy machinery for 24 hours (because of the sedation medicines used during the test).    SYMPTOMS TO REPORT IMMEDIATELY: A gastroenterologist can be reached at any hour.  During normal business hours, 8:30 AM to 5:00 PM Monday through Friday, call [phone].  After hours and on weekends, please call the GI answering service at [phone] who will take a message and have the physician on call contact you.   Following lower  endoscopy (colonoscopy or flexible sigmoidoscopy):  Excessive amounts of blood in the stool  Significant tenderness or worsening of abdominal pains  Swelling of the abdomen that is new, acute  Fever of 100F or higher  of blood or coffee ground material  FOLLOW UP: If any biopsies were taken you will be contacted by phone or by letter within the next 1-3 weeks.  Call your gastroenterologist if you have not heard about the biopsies in 3 weeks.  Our staff will call the home number listed on your records the next business day following your procedure to check on you and address any questions or concerns that you may have at that time regarding the information given to you following your procedure. This is a courtesy call and so if there is no answer at the home number and we have not heard from you through the emergency physician on call, we will assume that you  have returned to your regular daily activities without incident.  SIGNATURES/CONFIDENTIALITY: You and/or your care partner have signed paperwork which will be entered into your electronic medical record.  These signatures attest to the fact that that the information above on your After Visit Summary has been reviewed and is understood.  Full responsibility of the confidentiality of this discharge information lies with you and/or your care-partner.  Polyp, diverticulosis,information given. high fiber diet with liberal fluid intake encouraged.  Hold aspirin, aspirin products and anti-inflammatory medications for 2 weeks.    Repeat colonoscopy in 5 years.

## 2013-04-11 NOTE — Progress Notes (Signed)
Lidocaine-40mg IV prior to Propofol InductionPropofol given over incremental dosages 

## 2013-04-11 NOTE — Op Note (Signed)
[facility]  [address]. [place], [address]   COLONOSCOPY PROCEDURE REPORT  PATIENT: Heather Townsend, Heather Townsend  MR#: [id number] BIRTHDATE: September 18, 1945 , 67  yrs. old GENDER: Female ENDOSCOPIST: Ladene Artist, MD, [facility] PROCEDURE DATE:  04/11/2013 PROCEDURE:   Colonoscopy with snare polypectomy First Screening Colonoscopy - Avg.  risk and is 50 yrs.  old or older - No.  Prior Negative Screening - Now for repeat screening. Above average risk  History of Adenoma - Now for follow-up colonoscopy & has been > or = to 3 yrs.  N/A  Polyps Removed Today? Yes. ASA CLASS:   Class III INDICATIONS:Patient's family history of colon cancer, distant relatives. MEDICATIONS: MAC sedation, administered by CRNA and propofol (Diprivan) 134m IV DESCRIPTION OF PROCEDURE:   After the risks benefits and alternatives of the procedure were thoroughly explained, informed consent was obtained.  A digital rectal exam revealed no abnormalities of the rectum.   The LB [id number] endoscope was introduced through the anus and advanced to the cecum, which was identified by both the appendix and ileocecal valve. No adverse events experienced.   The quality of the prep was good, using MoviPrep  The instrument was then slowly withdrawn as the colon was fully examined.  COLON FINDINGS: A sessile polyp measuring 9 mm in size was found in the transverse colon.  A polypectomy was performed using snare cautery.  The resection was complete and the polyp tissue was completely retrieved.  A sessile polyp measuring 5 mm in size was found in the rectum.  A polypectomy was performed with a cold snare.  The resection was complete and the polyp tissue was completely retrieved.  Mild diverticulosis was noted in the sigmoid colon.  The colon was otherwise normal.  There was no diverticulosis, inflammation, polyps or cancers unless previously stated.  Retroflexed views revealed no abnormalities. The time  to cecum=2 minutes 58 seconds.  Withdrawal time=10 minutes 00 seconds. The scope was withdrawn and the procedure completed. COMPLICATIONS: There were no complications.  ENDOSCOPIC IMPRESSION: 1.   Sessile polyp measuring 9 mm in the transverse colon; polypectomy performed using snare cautery 2.   Sessile polyp measuring 5 mm in the rectum; polypectomy performed with a cold snare 3.   Mild diverticulosis in the sigmoid colon  RECOMMENDATIONS: 1.  Await pathology results 2.  Hold aspirin, aspirin products, and anti-inflammatory medication for 2 weeks. 3.  High fiber diet with liberal fluid intake. 4.  Repeat Colonoscopy in 5 years.  eSigned:  MLadene Artist MD, F[facility]11/04/2013 10:42 AM

## 2013-04-11 NOTE — Progress Notes (Signed)
Called to room to assist during endoscopic procedure.  Patient ID and intended procedure confirmed with present staff. Received instructions for my participation in the procedure from the performing physician.  

## 2013-04-11 NOTE — Progress Notes (Signed)
Patient did not experience any of the following events: a burn prior to discharge; a fall within the facility; wrong site/side/patient/procedure/implant event; or a hospital transfer or hospital admission upon discharge from the facility. (G8907) Patient did not have preoperative order for IV antibiotic SSI prophylaxis. (G8918)  

## 2013-04-12 ENCOUNTER — Telehealth: Payer: Self-pay | Admitting: *Deleted

## 2013-04-12 NOTE — Telephone Encounter (Signed)
Message left

## 2013-04-17 ENCOUNTER — Encounter: Payer: Self-pay | Admitting: Gastroenterology

## 2013-04-19 ENCOUNTER — Ambulatory Visit (AMBULATORY_SURGERY_CENTER): Payer: BC Managed Care – PPO | Admitting: Gastroenterology

## 2013-04-19 ENCOUNTER — Encounter: Payer: Self-pay | Admitting: Gastroenterology

## 2013-04-19 VITALS — BP 128/76 | HR 67 | Temp 96.1°F | Resp 13 | Ht 64.0 in | Wt 153.0 lb

## 2013-04-19 DIAGNOSIS — K219 Gastro-esophageal reflux disease without esophagitis: Secondary | ICD-10-CM

## 2013-04-19 MED ORDER — SODIUM CHLORIDE 0.9 % IV SOLN: 500.0000 mL | INTRAVENOUS | Status: DC

## 2013-04-19 NOTE — Op Note (Addendum)
[facility] [address] [place], [address]   ENDOSCOPY PROCEDURE REPORT  PATIENT: Heather, Townsend  MR#: [id number] BIRTHDATE: 1945-07-31 , 67  yrs. old GENDER: Female ENDOSCOPIST: Meryl Dare, MD, [facility] PROCEDURE DATE:  04/19/2013 PROCEDURE:  EGD, diagnostic ASA CLASS:     Class II INDICATIONS:  Screening for varices.   History of esophageal reflux.  MEDICATIONS: MAC sedation, administered by CRNA and propofol (Diprivan) 100mg  IV TOPICAL ANESTHETIC: Cetacaine Spray DESCRIPTION OF PROCEDURE: After the risks benefits and alternatives of the procedure were thoroughly explained, informed consent was obtained.  The LB [id number] endoscope was introduced through the mouth and advanced to the second portion of the duodenum  without limitations.  The instrument was slowly withdrawn as the mucosa was fully examined.  ESOPHAGUS: The mucosa of the esophagus appeared normal.  No varices.  STOMACH: The mucosa and folds of the stomach appeared normal. DUODENUM: The duodenal mucosa showed no abnormalities in the bulb and second portion of the duodenum.  Retroflexed views revealed a small hiatal hernia.  The scope was then withdrawn from the patient and the procedure completed.  COMPLICATIONS: There were no complications.  ENDOSCOPIC IMPRESSION: 1.   Small hiatal hernia 2.   The EGD otherwise appeared normal  RECOMMENDATIONS: 1.  Anti-reflux regimen 2.  Continue PPI 3.  Endoscopy in 2 years for varices surveillance   eSigned:  Meryl Dare, MD, [facility] 04/19/2013 11:01 AM Revised: 04/19/2013 11:01 AM

## 2013-04-19 NOTE — Patient Instructions (Signed)

## 2013-04-19 NOTE — Progress Notes (Signed)
Patient did not experience any of the following events: a burn prior to discharge; a fall within the facility; wrong site/side/patient/procedure/implant event; or a hospital transfer or hospital admission upon discharge from the facility. (G8907) Patient did not have preoperative order for IV antibiotic SSI prophylaxis. (G8918)  

## 2013-05-22 ENCOUNTER — Encounter: Payer: Self-pay | Admitting: Internal Medicine

## 2013-07-05 ENCOUNTER — Encounter: Payer: Self-pay | Admitting: Internal Medicine

## 2013-08-03 ENCOUNTER — Telehealth: Payer: Self-pay | Admitting: Internal Medicine

## 2013-08-03 MED ORDER — DEXLANSOPRAZOLE 60 MG PO CPDR: 60.0000 mg | DELAYED_RELEASE_CAPSULE | ORAL | Status: DC

## 2013-08-03 NOTE — Telephone Encounter (Signed)
[place], [place] - [address] requesting refill of dexlansoprazole (DEXILANT) 60 MG capsule

## 2013-09-04 ENCOUNTER — Ambulatory Visit (INDEPENDENT_AMBULATORY_CARE_PROVIDER_SITE_OTHER): Payer: BC Managed Care – PPO | Admitting: Internal Medicine

## 2013-09-04 ENCOUNTER — Encounter: Payer: Self-pay | Admitting: Internal Medicine

## 2013-09-04 VITALS — BP 120/70 | HR 82 | Temp 98.0°F | Ht 64.5 in | Wt 156.0 lb

## 2013-09-04 DIAGNOSIS — K7689 Other specified diseases of liver: Secondary | ICD-10-CM

## 2013-09-04 DIAGNOSIS — K760 Fatty (change of) liver, not elsewhere classified: Secondary | ICD-10-CM

## 2013-09-04 LAB — HEPATIC FUNCTION PANEL
ALBUMIN: 3.9 g/dL (ref 3.5–5.2)
ALT: 114 U/L — ABNORMAL HIGH (ref 0–35)
AST: 106 U/L — ABNORMAL HIGH (ref 0–37)
Alkaline Phosphatase: 94 U/L (ref 39–117)
Bilirubin, Direct: 0.1 mg/dL (ref 0.0–0.3)
Total Bilirubin: 0.5 mg/dL (ref 0.3–1.2)
Total Protein: 7.9 g/dL (ref 6.0–8.3)

## 2013-09-04 NOTE — Progress Notes (Signed)
Subjective:    Patient ID: Heather Townsend, female    DOB: [birth year], 68 y.o.   MRN: [id number]  HPI Endo and colon and needs an US of the liver Fatty liver Refused pneumovax due to family fears   Review of Systems  Constitutional: Negative for activity change, appetite change and fatigue.  HENT: Negative for congestion, ear pain, postnasal drip and sinus pressure.   Eyes: Negative for redness and visual disturbance.  Respiratory: Negative for cough, shortness of breath and wheezing.   Gastrointestinal: Negative for abdominal pain and abdominal distention.  Genitourinary: Negative for dysuria, frequency and menstrual problem.  Musculoskeletal: Negative for arthralgias, joint swelling, myalgias and neck pain.  Skin: Negative for rash and wound.  Neurological: Negative for dizziness, weakness and headaches.  Hematological: Negative for adenopathy. Does not bruise/bleed easily.  Psychiatric/Behavioral: Negative for sleep disturbance and decreased concentration.       Past Medical History  Diagnosis Date  . Low back pain   . Hepatitis, chronic active     mmild chronic hepatitis-etiology not clear  . Diverticulosis   . Hyperlipidemia   . GERD (gastroesophageal reflux disease)   . Hiatal hernia   . Fatty infiltration of liver   . Allergy     History   Social History  . Marital Status: Divorced    Spouse Name: N/A    Number of Children: N/A  . Years of Education: N/A   Occupational History  . office manager    Social History Main Topics  . Smoking status: Former Smoker    Quit date: 06/01/1986  . Smokeless tobacco: Never Used     Comment: started in 1965-smoked less than 1 ppd  . Alcohol Use: Yes     Comment: rare  . Drug Use: No  . Sexual Activity: Not Currently   Other Topics Concern  . Not on file   Social History Narrative   Pt has children and lives alone    Past Surgical History  Procedure Laterality Date  . Tubal ligation    . Appendectomy    .  Tonsillectomy    . Uterine suspension    . Ganglion cysts      left hand  . Shoulder surgery  2006    rt shoulder  . Hand surgery  1996    Family History  Problem Relation Age of Onset  . Heart disease Mother   . Diabetes Mother   . Pancreatic cancer Paternal Uncle   . Colon cancer Paternal Uncle   . Stomach cancer Paternal Uncle   . Cancer Maternal Aunt     Stomach  . Esophageal cancer Maternal Aunt   . Esophageal cancer Maternal Uncle   . Rectal cancer Neg Hx     Allergies  Allergen Reactions  . Celecoxib     REACTION: Swelling  . Codeine   . Ibuprofen   . Meloxicam     REACTION: Swelling  . Rofecoxib   . Sulfonamide Derivatives     Current Outpatient Prescriptions on File Prior to Visit  Medication Sig Dispense Refill  . dexlansoprazole (DEXILANT) 60 MG capsule Take 1 capsule (60 mg total) by mouth 2 (two) times a week.  30 capsule  0  . oxyCODONE-acetaminophen (PERCOCET/ROXICET) 5-325 MG per tablet Take 0.5 tablets by mouth daily as needed for pain.      . prednisoLONE acetate (PRED FORTE) 1 % ophthalmic suspension        No current facility-administered medications on file prior  to visit.    BP 120/70  Pulse 82  Temp(Src) 98 F (36.7 C) (Oral)  Ht 5' 4.5" (1.638 m)  Wt 156 lb (70.761 kg)  BMI 26.37 kg/m2  SpO2 97%    Objective:   Physical Exam  Constitutional: She is oriented to person, place, and time. She appears well-developed and well-nourished.  HENT:  Head: Normocephalic and atraumatic.  Neck: Normal range of motion. Neck supple.  Cardiovascular: Normal rate and regular rhythm.   Murmur heard. Pulmonary/Chest: Effort normal and breath sounds normal.  Abdominal: Soft. Bowel sounds are normal.  Musculoskeletal: Normal range of motion.  Neurological: She is alert and oriented to person, place, and time.          Assessment & Plan:  Refused the pnemonia shot and the shingles shot Needs [place] and liver functions

## 2013-09-04 NOTE — Patient Instructions (Signed)
The patient is instructed to continue all medications as prescribed. Schedule followup with check out clerk upon leaving the clinic  

## 2013-09-04 NOTE — Addendum Note (Signed)
Addended by: Monico Blitz T on: 09/04/2013 03:53 PM   Modules accepted: Orders

## 2013-09-04 NOTE — Progress Notes (Signed)
Pre visit review using our clinic review tool, if applicable. No additional management support is needed unless otherwise documented below in the visit note. 

## 2013-09-07 ENCOUNTER — Ambulatory Visit
Admission: RE | Admit: 2013-09-07 | Discharge: 2013-09-07 | Disposition: A | Discharge status: Home / Self Care | Payer: Medicare Other | Source: Ambulatory Visit | Attending: Internal Medicine | Admitting: Internal Medicine

## 2013-09-07 ENCOUNTER — Telehealth: Payer: Self-pay | Admitting: Internal Medicine

## 2013-09-07 DIAGNOSIS — K760 Fatty (change of) liver, not elsewhere classified: Secondary | ICD-10-CM

## 2013-09-07 NOTE — Telephone Encounter (Signed)
Pt said she has a sinus infection, cough, and headache wanted to know if we could call her in something

## 2013-09-07 NOTE — Telephone Encounter (Signed)
Can you call back, pt will need appt to be seen.

## 2013-09-08 NOTE — Telephone Encounter (Signed)
Pt said she is feeling better decline appt //

## 2013-12-04 ENCOUNTER — Telehealth: Payer: Self-pay | Admitting: Internal Medicine

## 2013-12-04 NOTE — Telephone Encounter (Signed)
°   [place], [place] - [address] is requesting re-fill on dexlansoprazole (DEXILANT) 60 MG capsule

## 2013-12-05 MED ORDER — DEXLANSOPRAZOLE 60 MG PO CPDR: 60.0000 mg | DELAYED_RELEASE_CAPSULE | ORAL | Status: DC

## 2013-12-05 NOTE — Telephone Encounter (Signed)
rx sent in electronically 

## 2014-02-28 ENCOUNTER — Other Ambulatory Visit: Payer: BC Managed Care – PPO

## 2014-03-07 ENCOUNTER — Ambulatory Visit (INDEPENDENT_AMBULATORY_CARE_PROVIDER_SITE_OTHER): Payer: Medicare Other | Admitting: Physician Assistant

## 2014-03-07 ENCOUNTER — Encounter: Payer: Self-pay | Admitting: Physician Assistant

## 2014-03-07 ENCOUNTER — Encounter: Payer: BC Managed Care – PPO | Admitting: Internal Medicine

## 2014-03-07 VITALS — BP 120/80 | HR 60 | Temp 97.5°F | Resp 18 | Ht 64.0 in | Wt 166.8 lb

## 2014-03-07 DIAGNOSIS — E785 Hyperlipidemia, unspecified: Secondary | ICD-10-CM | POA: Diagnosis not present

## 2014-03-07 DIAGNOSIS — R7302 Impaired glucose tolerance (oral): Secondary | ICD-10-CM | POA: Diagnosis not present

## 2014-03-07 DIAGNOSIS — B3749 Other urogenital candidiasis: Secondary | ICD-10-CM

## 2014-03-07 DIAGNOSIS — R300 Dysuria: Secondary | ICD-10-CM | POA: Diagnosis not present

## 2014-03-07 DIAGNOSIS — K760 Fatty (change of) liver, not elsewhere classified: Secondary | ICD-10-CM | POA: Diagnosis not present

## 2014-03-07 DIAGNOSIS — Z0000 Encounter for general adult medical examination without abnormal findings: Secondary | ICD-10-CM | POA: Diagnosis not present

## 2014-03-07 DIAGNOSIS — J019 Acute sinusitis, unspecified: Secondary | ICD-10-CM

## 2014-03-07 DIAGNOSIS — M8580 Other specified disorders of bone density and structure, unspecified site: Secondary | ICD-10-CM | POA: Insufficient documentation

## 2014-03-07 LAB — POCT URINALYSIS DIPSTICK
BILIRUBIN UA: NEGATIVE
GLUCOSE UA: NEGATIVE
Ketones, UA: NEGATIVE
NITRITE UA: NEGATIVE
Protein, UA: NEGATIVE
Spec Grav, UA: 1.025
Urobilinogen, UA: 0.2
pH, UA: 5.5

## 2014-03-07 LAB — CBC WITH DIFFERENTIAL/PLATELET
BASOS PCT: 0.6 % (ref 0.0–3.0)
Basophils Absolute: 0 10*3/uL (ref 0.0–0.1)
EOS PCT: 4.2 % (ref 0.0–5.0)
Eosinophils Absolute: 0.2 10*3/uL (ref 0.0–0.7)
HCT: 44.1 % (ref 36.0–46.0)
HEMOGLOBIN: 14.8 g/dL (ref 12.0–15.0)
Lymphocytes Relative: 27.3 % (ref 12.0–46.0)
Lymphs Abs: 1.5 10*3/uL (ref 0.7–4.0)
MCHC: 33.5 g/dL (ref 30.0–36.0)
MCV: 93.6 fl (ref 78.0–100.0)
MONOS PCT: 8.5 % (ref 3.0–12.0)
Monocytes Absolute: 0.5 10*3/uL (ref 0.1–1.0)
Neutro Abs: 3.3 10*3/uL (ref 1.4–7.7)
Neutrophils Relative %: 59.4 % (ref 43.0–77.0)
Platelets: 136 10*3/uL — ABNORMAL LOW (ref 150.0–400.0)
RBC: 4.71 Mil/uL (ref 3.87–5.11)
RDW: 14.3 % (ref 11.5–15.5)
WBC: 5.6 10*3/uL (ref 4.0–10.5)

## 2014-03-07 LAB — COMPREHENSIVE METABOLIC PANEL
ALK PHOS: 122 U/L — AB (ref 39–117)
ALT: 98 U/L — ABNORMAL HIGH (ref 0–35)
AST: 103 U/L — ABNORMAL HIGH (ref 0–37)
Albumin: 3.3 g/dL — ABNORMAL LOW (ref 3.5–5.2)
BILIRUBIN TOTAL: 0.7 mg/dL (ref 0.2–1.2)
BUN: 10 mg/dL (ref 6–23)
CO2: 27 mEq/L (ref 19–32)
Calcium: 9.6 mg/dL (ref 8.4–10.5)
Chloride: 104 mEq/L (ref 96–112)
Creatinine, Ser: 0.7 mg/dL (ref 0.4–1.2)
GFR: 87.38 mL/min (ref >60.00)
GLUCOSE: 130 mg/dL — AB (ref 70–99)
Potassium: 4 mEq/L (ref 3.5–5.1)
SODIUM: 140 meq/L (ref 135–145)
TOTAL PROTEIN: 7.7 g/dL (ref 6.0–8.3)

## 2014-03-07 LAB — TSH: TSH: 4.08 u[IU]/mL (ref 0.35–4.50)

## 2014-03-07 LAB — LIPID PANEL
CHOLESTEROL: 199 mg/dL (ref 0–200)
HDL: 41.8 mg/dL (ref >39.00)
LDL CALC: 137 mg/dL — AB (ref 0–99)
NonHDL: 157.2
Total CHOL/HDL Ratio: 5
Triglycerides: 101 mg/dL (ref 0.0–149.0)
VLDL: 20.2 mg/dL (ref 0.0–40.0)

## 2014-03-07 LAB — HEMOGLOBIN A1C: Hgb A1c MFr Bld: 7.3 % — ABNORMAL HIGH (ref 4.6–6.5)

## 2014-03-07 MED ORDER — AMOXICILLIN-POT CLAVULANATE 875-125 MG PO TABS: 1.0000 | ORAL_TABLET | Freq: Two times a day (BID) | ORAL | Status: DC

## 2014-03-07 NOTE — Addendum Note (Signed)
Addended by: Denna Haggard K on: 03/07/2014 11:07 AM   Modules accepted: Orders

## 2014-03-07 NOTE — Patient Instructions (Addendum)
Continue current medication regimens.  Continue heart healthy diet and regular exercise.  We will call you with your lab results when they are available.  Augmentin twice daily for 10 days to treat sinus infection.  Push fluid hydration with water.  You will be called to schedule a repeat liver ultrasound.  Maintain your followup with your liver specialist.  Call your insurance company and ask them if they will cover your bone density scan as you have a history of osteopenia.  If emergency symptoms discussed during visit developed, seek medical attention immediately.  Followup as needed, or for worsening or persistent symptoms despite treatment.      Health Maintenance Adopting a healthy lifestyle and getting preventive care can go a long way to promote health and wellness. Talk with your health care provider about what schedule of regular examinations is right for you. This is a good chance for you to check in with your provider about disease prevention and staying healthy. In between checkups, there are plenty of things you can do on your own. Experts have done a lot of research about which lifestyle changes and preventive measures are most likely to keep you healthy. Ask your health care provider for more information. WEIGHT AND DIET  Eat a healthy diet  Be sure to include plenty of vegetables, fruits, low-fat dairy products, and lean protein.  Do not eat a lot of foods high in solid fats, added sugars, or salt.  Get regular exercise. This is one of the most important things you can do for your health.  Most adults should exercise for at least 150 minutes each week. The exercise should increase your heart rate and make you sweat (moderate-intensity exercise).  Most adults should also do strengthening exercises at least twice a week. This is in addition to the moderate-intensity exercise.  Maintain a healthy weight  Body mass index (BMI) is a measurement that can be used  to identify possible weight problems. It estimates body fat based on height and weight. Your health care provider can help determine your BMI and help you achieve or maintain a healthy weight.  For females 45 years of age and older:   A BMI below 18.5 is considered underweight.  A BMI of 18.5 to 24.9 is normal.  A BMI of 25 to 29.9 is considered overweight.  A BMI of 30 and above is considered obese.  Watch levels of cholesterol and blood lipids  You should start having your blood tested for lipids and cholesterol at 66 years of age, then have this test every 5 years.  You may need to have your cholesterol levels checked more often if:  Your lipid or cholesterol levels are high.  You are older than 66 years of age.  You are at high risk for heart disease.  CANCER SCREENING   Lung Cancer  Lung cancer screening is recommended for adults 56-63 years old who are at high risk for lung cancer because of a history of smoking.  A yearly low-dose CT scan of the lungs is recommended for people who:  Currently smoke.  Have quit within the past 15 years.  Have at least a 30-pack-year history of smoking. A pack year is smoking an average of one pack of cigarettes a day for 1 year.  Yearly screening should continue until it has been 15 years since you quit.  Yearly screening should stop if you develop a health problem that would prevent you from having lung cancer treatment.  Breast Cancer  Practice breast self-awareness. This means understanding how your breasts normally appear and feel.  It also means doing regular breast self-exams. Let your health care provider know about any changes, no matter how small.  If you are in your 20s or 30s, you should have a clinical breast exam (CBE) by a health care provider every 1-3 years as part of a regular health exam.  If you are 62 or older, have a CBE every year. Also consider having a breast X-ray (mammogram) every year.  If you  have a family history of breast cancer, talk to your health care provider about genetic screening.  If you are at high risk for breast cancer, talk to your health care provider about having an MRI and a mammogram every year.  Breast cancer gene (BRCA) assessment is recommended for women who have family members with BRCA-related cancers. BRCA-related cancers include:  Breast.  Ovarian.  Tubal.  Peritoneal cancers.  Results of the assessment will determine the need for genetic counseling and BRCA1 and BRCA2 testing. Cervical Cancer Routine pelvic examinations to screen for cervical cancer are no longer recommended for nonpregnant women who are considered low risk for cancer of the pelvic organs (ovaries, uterus, and vagina) and who do not have symptoms. A pelvic examination may be necessary if you have symptoms including those associated with pelvic infections. Ask your health care provider if a screening pelvic exam is right for you.   The Pap test is the screening test for cervical cancer for women who are considered at risk.  If you had a hysterectomy for a problem that was not cancer or a condition that could lead to cancer, then you no longer need Pap tests.  If you are older than 65 years, and you have had normal Pap tests for the past 10 years, you no longer need to have Pap tests.  If you have had past treatment for cervical cancer or a condition that could lead to cancer, you need Pap tests and screening for cancer for at least 20 years after your treatment.  If you no longer get a Pap test, assess your risk factors if they change (such as having a new sexual partner). This can affect whether you should start being screened again.  Some women have medical problems that increase their chance of getting cervical cancer. If this is the case for you, your health care provider may recommend more frequent screening and Pap tests.  The human papillomavirus (HPV) test is another test  that may be used for cervical cancer screening. The HPV test looks for the virus that can cause cell changes in the cervix. The cells collected during the Pap test can be tested for HPV.  The HPV test can be used to screen women 55 years of age and older. Getting tested for HPV can extend the interval between normal Pap tests from three to five years.  An HPV test also should be used to screen women of any age who have unclear Pap test results.  After 66 years of age, women should have HPV testing as often as Pap tests.  Colorectal Cancer  This type of cancer can be detected and often prevented.  Routine colorectal cancer screening usually begins at 66 years of age and continues through 66 years of age.  Your health care provider may recommend screening at an earlier age if you have risk factors for colon cancer.  Your health care provider may also recommend using  home test kits to check for hidden blood in the stool.  A small camera at the end of a tube can be used to examine your colon directly (sigmoidoscopy or colonoscopy). This is done to check for the earliest forms of colorectal cancer.  Routine screening usually begins at age 23.  Direct examination of the colon should be repeated every 5-10 years through 66 years of age. However, you may need to be screened more often if early forms of precancerous polyps or small growths are found. Skin Cancer  Check your skin from head to toe regularly.  Tell your health care provider about any new moles or changes in moles, especially if there is a change in a mole's shape or color.  Also tell your health care provider if you have a mole that is larger than the size of a pencil eraser.  Always use sunscreen. Apply sunscreen liberally and repeatedly throughout the day.  Protect yourself by wearing long sleeves, pants, a wide-brimmed hat, and sunglasses whenever you are outside. HEART DISEASE, DIABETES, AND HIGH BLOOD PRESSURE   Have  your blood pressure checked at least every 1-2 years. High blood pressure causes heart disease and increases the risk of stroke.  If you are between 43 years and 61 years old, ask your health care provider if you should take aspirin to prevent strokes.  Have regular diabetes screenings. This involves taking a blood sample to check your fasting blood sugar level.  If you are at a normal weight and have a low risk for diabetes, have this test once every three years after 66 years of age.  If you are overweight and have a high risk for diabetes, consider being tested at a younger age or more often. PREVENTING INFECTION  Hepatitis B  If you have a higher risk for hepatitis B, you should be screened for this virus. You are considered at high risk for hepatitis B if:  You were born in a country where hepatitis B is common. Ask your health care provider which countries are considered high risk.  Your parents were born in a high-risk country, and you have not been immunized against hepatitis B (hepatitis B vaccine).  You have HIV or AIDS.  You use needles to inject street drugs.  You live with someone who has hepatitis B.  You have had sex with someone who has hepatitis B.  You get hemodialysis treatment.  You take certain medicines for conditions, including cancer, organ transplantation, and autoimmune conditions. Hepatitis C  Blood testing is recommended for:  Everyone born from [birth year] through 1963.  Anyone with known risk factors for hepatitis C. Sexually transmitted infections (STIs)  You should be screened for sexually transmitted infections (STIs) including gonorrhea and chlamydia if:  You are sexually active and are younger than 66 years of age.  You are older than 66 years of age and your health care provider tells you that you are at risk for this type of infection.  Your sexual activity has changed since you were last screened and you are at an increased risk for chlamydia  or gonorrhea. Ask your health care provider if you are at risk.  If you do not have HIV, but are at risk, it may be recommended that you take a prescription medicine daily to prevent HIV infection. This is called pre-exposure prophylaxis (PrEP). You are considered at risk if:  You are sexually active and do not regularly use condoms or know the HIV status of your  partner(s).  You take drugs by injection.  You are sexually active with a partner who has HIV. Talk with your health care provider about whether you are at high risk of being infected with HIV. If you choose to begin PrEP, you should first be tested for HIV. You should then be tested every 3 months for as long as you are taking PrEP.  PREGNANCY   If you are premenopausal and you may become pregnant, ask your health care provider about preconception counseling.  If you may become pregnant, take 400 to 800 micrograms (mcg) of folic acid every day.  If you want to prevent pregnancy, talk to your health care provider about birth control (contraception). OSTEOPOROSIS AND MENOPAUSE   Osteoporosis is a disease in which the bones lose minerals and strength with aging. This can result in serious bone fractures. Your risk for osteoporosis can be identified using a bone density scan.  If you are 64 years of age or older, or if you are at risk for osteoporosis and fractures, ask your health care provider if you should be screened.  Ask your health care provider whether you should take a calcium or vitamin D supplement to lower your risk for osteoporosis.  Menopause may have certain physical symptoms and risks.  Hormone replacement therapy may reduce some of these symptoms and risks. Talk to your health care provider about whether hormone replacement therapy is right for you.  HOME CARE INSTRUCTIONS   Schedule regular health, dental, and eye exams.  Stay current with your immunizations.   Do not use any tobacco products including  cigarettes, chewing tobacco, or electronic cigarettes.  If you are pregnant, do not drink alcohol.  If you are breastfeeding, limit how much and how often you drink alcohol.  Limit alcohol intake to no more than 1 drink per day for nonpregnant women. One drink equals 12 ounces of beer, 5 ounces of wine, or 1 ounces of hard liquor.  Do not use street drugs.  Do not share needles.  Ask your health care provider for help if you need support or information about quitting drugs.  Tell your health care provider if you often feel depressed.  Tell your health care provider if you have ever been abused or do not feel safe at home. Document Released: 12/01/2010 Document Revised: 10/02/2013 Document Reviewed: 04/19/2013 [facility] Patient Information 2015 [place], [place]. This information is not intended to replace advice given to you by your health care provider. Make sure you discuss any questions you have with your health care provider. Sinusitis Sinusitis is redness, soreness, and puffiness (inflammation) of the air pockets in the bones of your face (sinuses). The redness, soreness, and puffiness can cause air and mucus to get trapped in your sinuses. This can allow germs to grow and cause an infection.  HOME CARE   Drink enough fluids to keep your pee (urine) clear or pale yellow.  Use a humidifier in your home.  Run a hot shower to create steam in the bathroom. Sit in the bathroom with the door closed. Breathe in the steam 3-4 times a day.  Put a warm, moist washcloth on your face 3-4 times a day, or as told by your doctor.  Use salt water sprays (saline sprays) to wet the thick fluid in your nose. This can help the sinuses drain.  Only take medicine as told by your doctor. GET HELP RIGHT AWAY IF:   Your pain gets worse.  You have very bad headaches.  You are sick to your stomach (nauseous).  You throw up (vomit).  You are very sleepy (drowsy) all the time.  Your face is puffy  (swollen).  Your vision changes.  You have a stiff neck.  You have trouble breathing. MAKE SURE YOU:   Understand these instructions.  Will watch your condition.  Will get help right away if you are not doing well or get worse. Document Released: 11/04/2007 Document Revised: 02/10/2012 Document Reviewed: 12/22/2011 [facility] Patient Information 2015 [place], [place]. This information is not intended to replace advice given to you by your health care provider. Make sure you discuss any questions you have with your health care provider.

## 2014-03-07 NOTE — Progress Notes (Signed)
Subjective:    Patient ID: Heather Townsend, female    DOB: 01/15/1946, 68 y.o.   MRN: [id number]  HPI Patient presents to clinic today for annual exam and MWV.  Patient is also concerned about a flareup of her chronic ethmoidal sinusitis identified by CT in the past. She states that she's been experiencing pain in around her left ear and headache for the past week or so. She states that this is always an indicator that she is flaring. She has tried using Flonase and antihistamines which have not been beneficial to prevent her symptoms. She states that she usually receives a course of antibiotics for her flares which resolved them.  Health Maintenance: Dental -- Dr. Tina Griffiths, [facility] Vision -- Annual eye exam, [place], wears glasses. Immunizations -- UTD, declines, pneumonia, flu, zoster. Colonoscopy -- Last in 2014, every 5 years. Hx of colon polyps. Mammogram -- Every 2 years, last 06/15/2013, no fam hx of breast cancer. PAP -- Dr. Kris Mouton [facility] annually. Bone Density -- Needs referral.     [place]. Risk factors, based on past  M,S,F history: HLD, impaired glucose tolerance.  2.  Physical activities: Bowl once weekly, dancing 3x/week, walk 3x/week.  3.  Depression/mood: No history of, no problems identified.  4.  Hearing: No major deficits  5.  ADL's: Independent in all aspects of daily living  6.  Fall risk: Low  7.  Home safety: No problems identified  8.  Height weight, and visual acuity: Height and weight stable, glasses.  9.  Counseling: Regular exercise and heart healthy diet all encouraged  10. Lab orders based on risk factors: Labs ordered.  11. Referral : Keep F/u liver specialist and neurologist. Referral for bone density.  12. Care plan: Continue present regimen.  13. Cognitive assessment: Alert and oriented with normal affect no cognitive dysfunction  14. Screening: Health maintenance up-to-date with the exception of vaccinations, refuses. Needs Bone  Density Scan. Needs liver US every 6 months.  15. Provider List Update: Dr. Kris Mouton, Dr. Trula Ore.     Review of Systems Patient denies  Fevers, chills, nausea, vomiting, diarrhea,chest pain, shortness of breath, orthopnea, headache, syncope. Denies lower extremity edema, abdominal pain, change in appetite, change in bowel movements. Patient denies rashes, musculoskeletal complaints. No other specific complaints in a complete review of systems.  Past Medical History  Diagnosis Date  . Low back pain   . Hepatitis, chronic active     mmild chronic hepatitis-etiology not clear  . Diverticulosis   . Hyperlipidemia   . GERD (gastroesophageal reflux disease)   . Hiatal hernia   . Fatty infiltration of liver   . Allergy     History   Social History  . Marital Status: Divorced    Spouse Name: N/A    Number of Children: N/A  . Years of Education: N/A   Occupational History  . office manager    Social History Main Topics  . Smoking status: Former Smoker    Quit date: 06/01/1986  . Smokeless tobacco: Never Used     Comment: started in 1965-smoked less than 1 ppd  . Alcohol Use: Yes     Comment: rare  . Drug Use: No  . Sexual Activity: Not Currently   Other Topics Concern  . Not on file   Social History Narrative   Pt has children and lives alone    Past Surgical History  Procedure Laterality Date  . Tubal ligation    . Appendectomy    .  Tonsillectomy    . Uterine suspension    . Ganglion cysts      left hand  . Shoulder surgery  2006    rt shoulder  . Hand surgery  1996    Family History  Problem Relation Age of Onset  . Heart disease Mother   . Diabetes Mother   . Pancreatic cancer Paternal Uncle   . Colon cancer Paternal Uncle   . Stomach cancer Paternal Uncle   . Cancer Maternal Aunt     Stomach  . Esophageal cancer Maternal Aunt   . Esophageal cancer Maternal Uncle   . Rectal cancer Neg Hx     Allergies  Allergen Reactions  . Celecoxib      REACTION: Swelling  . Codeine   . Ibuprofen   . Meloxicam     REACTION: Swelling  . Rofecoxib   . Sulfonamide Derivatives     Current Outpatient Prescriptions on File Prior to Visit  Medication Sig Dispense Refill  . dexlansoprazole (DEXILANT) 60 MG capsule Take 1 capsule (60 mg total) by mouth 2 (two) times a week.  30 capsule  5  . oxyCODONE-acetaminophen (PERCOCET/ROXICET) 5-325 MG per tablet Take 0.5 tablets by mouth daily as needed for pain.       No current facility-administered medications on file prior to visit.   The PFS history was reviewed with the patient at time of visit.   EXAM: BP 120/80  Pulse 60  Temp(Src) 97.5 F (36.4 C) (Oral)  Resp 18  Ht 5\' 4"  (1.626 m)  Wt 166 lb 12.8 oz (75.66 kg)  BMI 28.62 kg/m2     Objective:   Physical Exam  Nursing note and vitals reviewed. Constitutional: She is oriented to person, place, and time. She appears well-developed and well-nourished. No distress.  HENT:  Head: Normocephalic and atraumatic.  Right Ear: External ear normal.  Left Ear: External ear normal.  Nose: Nose normal.  Mouth/Throat: Oropharynx is clear and moist. No oropharyngeal exudate.  Bilateral TMs normal.  Eyes: Conjunctivae and EOM are normal. Pupils are equal, round, and reactive to light. Right eye exhibits no discharge. Left eye exhibits no discharge. No scleral icterus.  Neck: Normal range of motion. Neck supple. No JVD present. No tracheal deviation present. No thyromegaly present.  Cardiovascular: Normal rate, regular rhythm, normal heart sounds and intact distal pulses.  Exam reveals no gallop and no friction rub.   No murmur heard. Pulmonary/Chest: Effort normal and breath sounds normal. No stridor. No respiratory distress. She has no wheezes. She has no rales. She exhibits no tenderness.  No CVA ttp.  Abdominal: Soft. Bowel sounds are normal. She exhibits no distension and no mass. There is no tenderness. There is no rebound and no guarding.    Liver edge palpable  Genitourinary:  Breast and GYN Deferred to OB/GYN  Musculoskeletal: Normal range of motion. She exhibits no edema and no tenderness.  Gait normal  Lymphadenopathy:    She has no cervical adenopathy.  Neurological: She is alert and oriented to person, place, and time. She has normal reflexes. No cranial nerve deficit. She exhibits normal muscle tone. Coordination normal.  Skin: Skin is warm and dry. No rash noted. She is not diaphoretic. No erythema. No pallor.  Psychiatric: She has a normal mood and affect. Her behavior is normal. Judgment and thought content normal.     Lab Results  Component Value Date   WBC 7.3 12/07/2012   HGB 15.1* 12/07/2012   HCT 43.4  12/07/2012   PLT 153 12/07/2012   GLUCOSE 144* 11/28/2012   CHOL 208* 11/28/2012   TRIG 118.0 11/28/2012   HDL 41.70 11/28/2012   LDLDIRECT 148.3 11/28/2012   ALT 114* 09/04/2013   AST 106* 09/04/2013   NA 141 11/28/2012   K 4.8 11/28/2012   CL 105 11/28/2012   CREATININE 0.7 11/28/2012   BUN 10 11/28/2012   CO2 28 11/28/2012   TSH 5.93* 11/28/2012   INR 1.04 12/07/2012   HGBA1C 7.0* 11/28/2012   MICROALBUR 0.6 11/28/2012        Assessment & Plan:  Hafsah was seen today for annual exam.  Diagnoses and associated orders for this visit:  Fatty liver Comments: Repeat ultrasound, liver function studies, maintain followup of liver specialist. - US Abdomen Limited RUQ; Future - CBC with Differential - Lipid Panel - CMP - TSH - POC Urinalysis Dipstick - Hemoglobin A1c  Acute sinusitis, recurrence not specified, unspecified location Comments: Over a week of symptoms, treat empirically with Augmentin. Watchful waiting. - CBC with Differential - Lipid Panel - CMP - TSH - POC Urinalysis Dipstick - Hemoglobin A1c - amoxicillin-clavulanate (AUGMENTIN) 875-125 MG per tablet; Take 1 tablet by mouth 2 (two) times daily.  Annual physical exam Comments: Over all well, health maintenance up-to-date except for  vaccinations which patient declines, encourage healthy diet and exercise.  Impaired glucose tolerance Comments: Healthy diet, obtain repeat A1c. - CBC with Differential - Lipid Panel - CMP - TSH - POC Urinalysis Dipstick - Hemoglobin A1c  Dyslipidemia Comments: Obtain lipid panel and TSH. - TSH  Osteopenia Comments: History of on previous bone density scan, will obtain repeat bone density scan. - DG Bone Density; Future    Return precautions provided, and patient handout on health maintenance and Sinusitis.  Plan to follow up as needed, or for worsening or persistent symptoms despite treatment.  Patient Instructions  Continue current medication regimens.  Continue heart healthy diet and regular exercise.  We will call you with your lab results when they are available.  Augmentin twice daily for 10 days to treat sinus infection.  Push fluid hydration with water.  You will be called to schedule a repeat liver ultrasound.  Maintain your followup with your liver specialist.  Call your insurance company and ask them if they will cover your bone density scan as you have a history of osteopenia.  If emergency symptoms discussed during visit developed, seek medical attention immediately.  Followup as needed, or for worsening or persistent symptoms despite treatment.

## 2014-03-07 NOTE — Progress Notes (Signed)
Pre visit review using our clinic review tool, if applicable. No additional management support is needed unless otherwise documented below in the visit note. 

## 2014-03-08 ENCOUNTER — Telehealth: Payer: Self-pay | Admitting: Physician Assistant

## 2014-03-08 DIAGNOSIS — E119 Type 2 diabetes mellitus without complications: Secondary | ICD-10-CM

## 2014-03-08 MED ORDER — METFORMIN HCL 500 MG PO TABS: 500.0000 mg | ORAL_TABLET | Freq: Two times a day (BID) | ORAL | Status: DC

## 2014-03-08 NOTE — Telephone Encounter (Addendum)
Called to give pt her lab results. Pt has chronically elevated liver function studies and sees liver specialist for fatty liver disease. Unfortunately, she now has diabetes with an a1c of 7.3% despite frequent regular exercise. Discussed needing pt to check blood sugars now, and start pt on new medication to control diabetes. Discussed options with Dr. Burnice Logan, and I agree with him that we will start with metformin 500 mg twice a day. This will be sent to patient's pharmacy. Patient can come and pick up complementary glucometer to check her blood sugars throughout the day. Will have patient followup in 2-4 weeks to reassess and make sure she is tolerating the Medication well. The pt is amenable to this plan.

## 2014-03-09 ENCOUNTER — Other Ambulatory Visit: Payer: Self-pay | Admitting: Physician Assistant

## 2014-03-09 ENCOUNTER — Telehealth: Payer: Self-pay | Admitting: Physician Assistant

## 2014-03-09 DIAGNOSIS — R319 Hematuria, unspecified: Secondary | ICD-10-CM

## 2014-03-09 LAB — URINE CULTURE: Colony Count: 2000

## 2014-03-09 NOTE — Telephone Encounter (Signed)
Erroneous encounter, disregard

## 2014-03-09 NOTE — Telephone Encounter (Signed)
Called and spoke with the pt and informed her of note below. Pt understood and agreed.  Pt will come and pick-up glucometer and schedule an appt at that time.//AB/CMA

## 2014-03-09 NOTE — Telephone Encounter (Signed)
Spoke with pt regarding lab results in the hallway when she came today for glucometer. Pt had UA that showed trace blood and leuks, but had normal culture. Pt states that she always have blood in her urine, and has seen Urologist in the past, but never followed up with them. Pt will return in about 2 weeks for lab appointment for repeat UA to reassess blood. Pt is amenable to this plan.

## 2014-03-09 NOTE — Progress Notes (Signed)
Spoke with pt regarding lab results in the hallway when she came today for glucometer. Pt states that she always have blood in her urine, and has seen Urologist in the past, but never followed up with them. Pt will return in about 2 weeks for lab appointment for repeat UA to reassess blood. Pt is amenable to this plan.

## 2014-03-16 ENCOUNTER — Ambulatory Visit (INDEPENDENT_AMBULATORY_CARE_PROVIDER_SITE_OTHER)
Admission: RE | Admit: 2014-03-16 | Discharge: 2014-03-16 | Disposition: A | Discharge status: Home / Self Care | Payer: Medicare Other | Source: Ambulatory Visit | Attending: Physician Assistant | Admitting: Physician Assistant

## 2014-03-16 ENCOUNTER — Ambulatory Visit (INDEPENDENT_AMBULATORY_CARE_PROVIDER_SITE_OTHER): Payer: Medicare Other | Admitting: Physician Assistant

## 2014-03-16 ENCOUNTER — Encounter: Payer: Self-pay | Admitting: Physician Assistant

## 2014-03-16 VITALS — BP 130/84 | HR 72 | Temp 98.1°F | Resp 18 | Wt 162.3 lb

## 2014-03-16 DIAGNOSIS — R0602 Shortness of breath: Secondary | ICD-10-CM | POA: Diagnosis not present

## 2014-03-16 DIAGNOSIS — E119 Type 2 diabetes mellitus without complications: Secondary | ICD-10-CM | POA: Diagnosis not present

## 2014-03-16 DIAGNOSIS — R0989 Other specified symptoms and signs involving the circulatory and respiratory systems: Secondary | ICD-10-CM

## 2014-03-16 NOTE — Progress Notes (Signed)
Pre visit review using our clinic review tool, if applicable. No additional management support is needed unless otherwise documented below in the visit note. 

## 2014-03-16 NOTE — Patient Instructions (Addendum)
Chest Xray today at the [facility] office, we will call with the results of this when available.   Continue checking your sugars daily at varying times before each meal and before bedtime.   If emergency symptoms discussed during visit developed, seek medical attention immediately.  Followup in 1 month to reassess, or for worsening or persistent symptoms despite treatment.   Type 2 Diabetes Mellitus Type 2 diabetes mellitus is a long-term (chronic) disease. In type 2 diabetes:  The pancreas does not make enough of a hormone called insulin.  The cells in the body do not respond as well to the insulin that is made.  Both of the above can happen. Normally, insulin moves sugars from food into tissue cells. This gives you energy. If you have type 2 diabetes, sugars cannot be moved into tissue cells. This causes high blood sugar (hyperglycemia).  HOME CARE  Have your hemoglobin A1c level checked twice a year. The level shows if your diabetes is under control or out of control.  Test your blood sugar level every day as told by your doctor.  Check your ketone levels by testing your pee (urine) when you are sick and as told.  Take your diabetes or insulin medicine as told by your doctor.  Never run out of insulin.  Adjust how much insulin you give yourself based on how many carbs (carbohydrates) you eat. Carbs are in many foods, such as fruits, vegetables, whole grains, and dairy products.  Have a healthy snack between every healthy meal. Have 3 meals and 3 snacks a day.  Lose weight if you are overweight.  Carry a medical alert card or wear your medical alert jewelry.  Carry a 15-gram carb snack with you at all times. Examples include:  Glucose pills, 3 or 4.  Glucose gel, 15-gram tube.  Raisins, 2 tablespoons (24 grams).  Jelly beans, 6.  Animal crackers, 8.  Regular (not diet) pop, 4 ounces (120 milliliters).  Gummy treats, 9.  Notice low blood sugar (hypoglycemia) symptoms,  such as:  Shaking (tremors).  Trouble thinking clearly.  Sweating.  Faster heart rate.  Headache.  Dry mouth.  Hunger.  Crabbiness (irritability).  Being worried or tense (anxious).  Restless sleep.  A change in speech or coordination.  Confusion.  Treat low blood sugar right away. If you are alert and can swallow, follow the 15:15 rule:  Take 15-20 grams of a rapid-acting glucose or carb. This includes glucose gel, glucose pills, or 4 ounces (120 milliliters) of fruit juice, regular pop, or low-fat milk.  Check your blood sugar level 15 minutes after taking the glucose.  Take 15-20 grams more of glucose if the repeat blood sugar level is still 70 mg/dL (milligrams/deciliter) or below.  Eat a meal or snack within 1 hour of the blood sugar levels going back to normal.  Notice early symptoms of high blood sugar, such as:  Being really thirsty or drinking a lot (polydipsia).  Peeing a lot (polyuria).  Do at least 150 minutes of physical activity a week or as told.  Split the 150 minutes of activity up during the week. Do not do 150 minutes of activity in one day.  Perform exercises, such as weight lifting, at least 2 times a week or as told.  Spend no more than 90 minutes at one time inactive.  Adjust your insulin or food intake as needed if you start a new exercise or sport.  Follow your sick-day plan when you are not able  to eat or drink as usual.  Do not smoke, chew tobacco, or use electronic cigarettes.  Women who are not pregnant should drink no more than 1 drink a day. Men should drink no more than 2 drinks a day.  Only drink alcohol with food.  Ask your doctor if alcohol is safe for you.  Tell your doctor if you drink alcohol several times during the week.  See your doctor regularly.  Schedule an eye exam soon after you are told you have diabetes. Schedule exams once every year.  Check your skin and feet every day. Check for cuts, bruises,  redness, nail problems, bleeding, blisters, or sores. A doctor should do a foot exam once a year.  Brush your teeth and gums twice a day. Floss once a day. Visit your dentist regularly.  Share your diabetes plan with your workplace or school.  Stay up-to-date with shots that fight against diseases (immunizations).  Learn how to deal with stress.  Get diabetes education and support as needed.  Ask your doctor for special help if:  You need help to maintain or improve how you do things on your own.  You need help to maintain or improve the quality of your life.  You have foot or hand problems.  You have trouble cleaning yourself, dressing, eating, or doing physical activity. GET HELP IF:  You are unable to eat or drink for more than 6 hours.  You feel sick to your stomach (nauseous) or throw up (vomit) for more than 6 hours.  Your blood sugar level is over 240 mg/dL.  There is a change in mental status.  You get another serious illness.  You have watery poop (diarrhea) for more than 6 hours.  You have been sick or have had a fever for 2 or more days and are not getting better.  You have pain when you are active. GET HELP RIGHT AWAY IF:  You have trouble breathing.  Your ketone levels are higher than your doctor says they should be. MAKE SURE YOU:  Understand these instructions.  Will watch your condition.  Will get help right away if you are not doing well or get worse. Document Released: 02/25/2008 Document Revised: 10/02/2013 Document Reviewed: 12/18/2011 [facility] Patient Information 2015 [place], [place]. This information is not intended to replace advice given to you by your health care provider. Make sure you discuss any questions you have with your health care provider.

## 2014-03-16 NOTE — Progress Notes (Signed)
Subjective:    Patient ID: Heather Townsend, female    DOB: December 27, 1945, 68 y.o.   MRN: [id number]  HPI Patient is a 68 y.o. female presenting for follow up on blood sugars. Pt was seen last week for an annual physical, noted to have an A1c of 7.3. She was instructed to check her daily and to start Metformin 500 mg twice daily. She checks her sugars at various times of the day. She states that she has not been taking the Metformin and believes that the majority of her elevated sugar is coming from eating too much sugar. She has therefor not eaten any sugary products this week in an attempt to control her diabetes without medication, and her sugars reflect one week of this diet. She states that her sugars are averaging 138. She states that her highest sugars were 179 taken at 9:15 one evening, and a 173 at 10:30 one evening. Her morning blood sugars are closer to 100-110 range. She also states that her highest evening sugar was taken after eating a cookie. She states that she does not wish to try medication and truly believes that she can manage her condition without medication. Patient denies fevers, chills, nausea, vomiting, diarrhea, shortness of breath, chest pain, headache, syncope.     ROS Review of Systems As per HPI and are otherwise negative.   Past Medical History  Diagnosis Date  . Low back pain   . Hepatitis, chronic active     mmild chronic hepatitis-etiology not clear  . Diverticulosis   . Hyperlipidemia   . GERD (gastroesophageal reflux disease)   . Hiatal hernia   . Fatty infiltration of liver   . Allergy     History   Social History  . Marital Status: Divorced    Spouse Name: N/A    Number of Children: N/A  . Years of Education: N/A   Occupational History  . office manager    Social History Main Topics  . Smoking status: Former Smoker    Quit date: 06/01/1986  . Smokeless tobacco: Never Used     Comment: started in 1965-smoked less than 1 ppd  . Alcohol Use:  Yes     Comment: rare  . Drug Use: No  . Sexual Activity: Not Currently   Other Topics Concern  . Not on file   Social History Narrative   Pt has children and lives alone    Past Surgical History  Procedure Laterality Date  . Tubal ligation    . Appendectomy    . Tonsillectomy    . Uterine suspension    . Ganglion cysts      left hand  . Shoulder surgery  2006    rt shoulder  . Hand surgery  1996    Family History  Problem Relation Age of Onset  . Heart disease Mother   . Diabetes Mother   . Pancreatic cancer Paternal Uncle   . Colon cancer Paternal Uncle   . Stomach cancer Paternal Uncle   . Cancer Maternal Aunt     Stomach  . Esophageal cancer Maternal Aunt   . Esophageal cancer Maternal Uncle   . Rectal cancer Neg Hx     Allergies  Allergen Reactions  . Celecoxib     REACTION: Swelling  . Codeine   . Ibuprofen   . Meloxicam     REACTION: Swelling  . Rofecoxib   . Sulfonamide Derivatives     Current Outpatient Prescriptions on File Prior  to Visit  Medication Sig Dispense Refill  . amoxicillin-clavulanate (AUGMENTIN) 875-125 MG per tablet Take 1 tablet by mouth 2 (two) times daily.  20 tablet  0  . dexlansoprazole (DEXILANT) 60 MG capsule Take 1 capsule (60 mg total) by mouth 2 (two) times a week.  30 capsule  5  . metFORMIN (GLUCOPHAGE) 500 MG tablet Take 1 tablet (500 mg total) by mouth 2 (two) times daily with a meal.  60 tablet  3  . oxyCODONE-acetaminophen (PERCOCET/ROXICET) 5-325 MG per tablet Take 0.5 tablets by mouth daily as needed for pain.       No current facility-administered medications on file prior to visit.    EXAM: BP 130/84  Pulse 72  Temp(Src) 98.1 F (36.7 C) (Oral)  Resp 18  Wt 162 lb 4.8 oz (73.619 kg)      Objective:   Physical Exam  Nursing note and vitals reviewed. Constitutional: She is oriented to person, place, and time. She appears well-developed and well-nourished. No distress.  HENT:  Head: Normocephalic and  atraumatic.  Eyes: Conjunctivae and EOM are normal.  Cardiovascular: Normal rate, regular rhythm and intact distal pulses.   Pulmonary/Chest: Effort normal. No respiratory distress. She has no wheezes. She exhibits no tenderness.  Fine Crackles heard in right lung base.  Neurological: She is alert and oriented to person, place, and time.  Skin: Skin is warm and dry. She is not diaphoretic. No pallor.  Psychiatric: She has a normal mood and affect. Her behavior is normal. Judgment and thought content normal.    Lab Results  Component Value Date   WBC 5.6 03/07/2014   HGB 14.8 03/07/2014   HCT 44.1 03/07/2014   PLT 136.0* 03/07/2014   GLUCOSE 130* 03/07/2014   CHOL 199 03/07/2014   TRIG 101.0 03/07/2014   HDL 41.80 03/07/2014   LDLDIRECT 148.3 11/28/2012   LDLCALC 137* 03/07/2014   ALT 98* 03/07/2014   AST 103* 03/07/2014   NA 140 03/07/2014   K 4.0 03/07/2014   CL 104 03/07/2014   CREATININE 0.7 03/07/2014   BUN 10 03/07/2014   CO2 27 03/07/2014   TSH 4.08 03/07/2014   INR 1.04 12/07/2012   HGBA1C 7.3* 03/07/2014   MICROALBUR 0.6 11/28/2012         Assessment & Plan:  Heather Townsend was seen today for 1 week follow up.  Diagnoses and associated orders for this visit:  Type 2 diabetes mellitus without complication Comments: Pt not willing to try medicaiton at this time, continue daily sugar checks, f/u in 1 month.  Lung crackles Comments: CXR today. possibly chronic? emphasematous changes in lung on CXR last year. - DG Chest 2 View; Future    As pt is declining metformin therapy at this time, will have her continue her diabetic diet and daily sugar checks, and follow up in 1 month to reassess. The pt is amenable to this plan.  Return precautions provided, and patient handout on DM II.  Plan to follow up as needed, or for worsening or persistent symptoms despite treatment.  Patient Instructions  Chest Xray today at the [facility] office, we will call with the results of this when available.    Continue checking your sugars daily at varying times before each meal and before bedtime.   If emergency symptoms discussed during visit developed, seek medical attention immediately.  Followup in 1 month to reassess, or for worsening or persistent symptoms despite treatment.

## 2014-03-19 ENCOUNTER — Telehealth: Payer: Self-pay | Admitting: Family Medicine

## 2014-03-19 ENCOUNTER — Ambulatory Visit
Admission: RE | Admit: 2014-03-19 | Discharge: 2014-03-19 | Disposition: A | Discharge status: Home / Self Care | Payer: Medicare Other | Source: Ambulatory Visit | Attending: Physician Assistant | Admitting: Physician Assistant

## 2014-03-19 DIAGNOSIS — E119 Type 2 diabetes mellitus without complications: Secondary | ICD-10-CM

## 2014-03-19 DIAGNOSIS — K760 Fatty (change of) liver, not elsewhere classified: Secondary | ICD-10-CM

## 2014-03-19 MED ORDER — GLUCOSE BLOOD VI STRP: ORAL_STRIP | Status: DC

## 2014-03-19 NOTE — Telephone Encounter (Signed)
Rx sent to pharmacy   

## 2014-03-19 NOTE — Telephone Encounter (Signed)
Pt saw PA on 10/16 and needs new rx one touch verio test strips call into [facility]. Pt was give free meter

## 2014-03-21 DIAGNOSIS — K7581 Nonalcoholic steatohepatitis (NASH): Secondary | ICD-10-CM | POA: Diagnosis not present

## 2014-04-16 ENCOUNTER — Ambulatory Visit (INDEPENDENT_AMBULATORY_CARE_PROVIDER_SITE_OTHER): Payer: Medicare Other | Admitting: Family Medicine

## 2014-04-16 ENCOUNTER — Encounter: Payer: Self-pay | Admitting: Family Medicine

## 2014-04-16 VITALS — BP 100/68 | HR 62 | Temp 98.0°F | Wt 159.0 lb

## 2014-04-16 DIAGNOSIS — M5090 Cervical disc disorder, unspecified, unspecified cervical region: Secondary | ICD-10-CM | POA: Insufficient documentation

## 2014-04-16 DIAGNOSIS — IMO0002 Reserved for concepts with insufficient information to code with codable children: Secondary | ICD-10-CM

## 2014-04-16 DIAGNOSIS — E1165 Type 2 diabetes mellitus with hyperglycemia: Secondary | ICD-10-CM | POA: Diagnosis not present

## 2014-04-16 LAB — MICROALBUMIN / CREATININE URINE RATIO
Creatinine,U: 281.1 mg/dL
MICROALB UR: 0.9 mg/dL (ref 0.0–1.9)
MICROALB/CREAT RATIO: 0.3 mg/g (ref 0.0–30.0)

## 2014-04-16 NOTE — Assessment & Plan Note (Signed)
In relation to DM recommended this but patient refuses.

## 2014-04-16 NOTE — Assessment & Plan Note (Signed)
Mild poor control by last a1c at 7.3. Patient refuses medication and wants to focus on diet and exercise. Shehas already lost 7 pounds since October. Her fasting blood sugars are excellent from 88-104. We will see patient back in January and repeat an A1c one week before. With her history of fatty liver is very important for her to control her diabetes.

## 2014-04-16 NOTE — Patient Instructions (Addendum)
Diabetes (pre if you would like to view it that way) is doing much better. Let's see each other back mid January for a repeat visit (a1c a few days before-Keba please order)  Schedule lab visit and office visit  Health Maintenance Due  Topic Date Due  . OPHTHALMOLOGY EXAM - can you have them send [place] a record 07/29/1957  . URINE MICROALBUMIN - patient states always had blood in urine (history of scope)-easy bleeding vessels 11/28/2013

## 2014-04-16 NOTE — Progress Notes (Signed)
  Garret Reddish, MD Phone: [phone]  Subjective:   Heather Townsend is a 68 y.o. year old very pleasant female patient who presents with the following:  DIABETES Type II-mild poor control off last a1c, improving based off current blood sugars  Lab Results  Component Value Date   HGBA1C 7.3* 03/07/2014   HGBA1C 7.0* 11/28/2012   HGBA1C 6.7* 03/10/2012  Down 7.8 lbs since 10.7. Cut down on a lot of extra sweets.  Dance 2-3 x a week, in a bowling league, walking 1.5 miles 3x a week.   Medications taking and tolerating-no meds Blood Sugars per patient-fasting-88-104. Highest she had was 189 about 30 minutes after pecan pie, 152.   On Aspirin-every other day On statin-refuses Daily foot monitoring-yes Health Maintenance Due  Topic Date Due  . OPHTHALMOLOGY EXAM  07/29/1957  . URINE MICROALBUMIN  11/28/2013   ROS- Denies Polyuria,Polydipsia, nocturia, Vision changes. Denies Hypoglycemia symptoms (shaky, sweaty, hungry, weak anxious, tremor, palpitations, confusion, behavior change).    Pinched nerve in neck and back radiculopathy.  #120 in feb of 15. 1/2 tablet prn.   Past Medical History- Patient Active Problem List   Diagnosis Date Noted  . Type 2 diabetes mellitus 03/08/2014    Priority: High  . NASH (nonalcoholic steatohepatitis) 04/04/2013    Priority: High  . Cervical disc disease 04/16/2014    Priority: Medium  . Dyslipidemia 08/16/2007    Priority: Medium  . Osteopenia 03/07/2014    Priority: Low  . Rash, skin 01/25/2013    Priority: Low  . Renal calculus or stone 04/27/2011    Priority: Low  . GERD 09/05/2007    Priority: Low  . Chest pain 05/11/2012  . HIATAL HERNIA 12/07/2006   Medications- reviewed and updated Current Outpatient Prescriptions  Medication Sig Dispense Refill  . dexlansoprazole (DEXILANT) 60 MG capsule Take 1 capsule (60 mg total) by mouth 2 (two) times a week. 30 capsule 5  . glucose blood (ONETOUCH VERIO) test strip Test daily. 100  each 12  . oxyCODONE-acetaminophen (PERCOCET/ROXICET) 5-325 MG per tablet Take 0.5 tablets by mouth daily as needed for pain.     No current facility-administered medications for this visit.    Objective: BP 100/68 mmHg  Pulse 62  Temp(Src) 98 F (36.7 C)  Wt 159 lb (72.122 kg) Gen: NAD, resting comfortably  DM foot exam normal   Assessment/Plan:  Type 2 diabetes mellitus Mild poor control by last a1c at 7.3. Patient refuses medication and wants to focus on diet and exercise. Shehas already lost 7 pounds since October. Her fasting blood sugars are excellent from 88-104. We will see patient back in January and repeat an A1c one week before. With her history of fatty liver is very important for her to control her diabetes.  Dyslipidemia In relation to DM recommended this but patient refuses.    Return precautions advised.   >50% of 17 minute office visit was spent on counseling (meaning of diabetes, importance of control, diet/exercise, cholesterol recommendations, discussing patient rejection of diagnosis) and coordination of care   Orders Placed This Encounter  Procedures  . Microalbumin/Creatinine Ratio, Urine  . Hemoglobin A1c    Standing Status: Future     Number of Occurrences:      Standing Expiration Date: 04/17/2015

## 2014-05-01 DIAGNOSIS — M5416 Radiculopathy, lumbar region: Secondary | ICD-10-CM | POA: Diagnosis not present

## 2014-05-01 DIAGNOSIS — M5412 Radiculopathy, cervical region: Secondary | ICD-10-CM | POA: Diagnosis not present

## 2014-05-01 DIAGNOSIS — G5601 Carpal tunnel syndrome, right upper limb: Secondary | ICD-10-CM | POA: Diagnosis not present

## 2014-05-02 ENCOUNTER — Telehealth: Payer: Self-pay | Admitting: Family Medicine

## 2014-05-02 DIAGNOSIS — N200 Calculus of kidney: Secondary | ICD-10-CM

## 2014-05-02 NOTE — Telephone Encounter (Signed)
Dr. Hunter please see below 

## 2014-05-02 NOTE — Telephone Encounter (Signed)
We can order KUB of abdomen (x-ray) if patient wants but would want to discuss CT scan with her before ordering. She can start with x-ray if she wants and see me in the office. Order under nephrolithiasis if she wants this.

## 2014-05-02 NOTE — Telephone Encounter (Signed)
Patient states she passed a kidney stone and she saved it, but would like to know if Dr. Yong Channel can order imaging to see if there are any more left?  She's had them in the past.  She's still having discomfort in the right side. Patient would like to know if she have the imaging and then follow up with you afterwards??

## 2014-05-03 NOTE — Telephone Encounter (Signed)
Pt would like to start out with x ray. X ray ordered.

## 2014-06-14 ENCOUNTER — Other Ambulatory Visit: Payer: Medicare Other

## 2014-06-21 ENCOUNTER — Ambulatory Visit: Payer: Medicare Other | Admitting: Family Medicine

## 2014-06-28 DIAGNOSIS — Z1231 Encounter for screening mammogram for malignant neoplasm of breast: Secondary | ICD-10-CM | POA: Diagnosis not present

## 2014-06-28 LAB — HM MAMMOGRAPHY

## 2014-07-02 ENCOUNTER — Encounter: Payer: Self-pay | Admitting: Family Medicine

## 2014-08-02 ENCOUNTER — Other Ambulatory Visit (INDEPENDENT_AMBULATORY_CARE_PROVIDER_SITE_OTHER): Payer: Medicare Other

## 2014-08-02 DIAGNOSIS — E1165 Type 2 diabetes mellitus with hyperglycemia: Secondary | ICD-10-CM

## 2014-08-02 DIAGNOSIS — IMO0002 Reserved for concepts with insufficient information to code with codable children: Secondary | ICD-10-CM

## 2014-08-02 LAB — HEMOGLOBIN A1C: Hgb A1c MFr Bld: 6.5 % (ref 4.6–6.5)

## 2014-08-09 ENCOUNTER — Encounter: Payer: Self-pay | Admitting: Family Medicine

## 2014-08-09 ENCOUNTER — Ambulatory Visit (INDEPENDENT_AMBULATORY_CARE_PROVIDER_SITE_OTHER): Payer: Medicare Other | Admitting: Family Medicine

## 2014-08-09 VITALS — BP 110/70 | HR 77 | Temp 98.1°F | Wt 159.0 lb

## 2014-08-09 DIAGNOSIS — E785 Hyperlipidemia, unspecified: Secondary | ICD-10-CM | POA: Diagnosis not present

## 2014-08-09 DIAGNOSIS — E119 Type 2 diabetes mellitus without complications: Secondary | ICD-10-CM | POA: Diagnosis not present

## 2014-08-09 NOTE — Progress Notes (Signed)
  Garret Reddish, MD Phone: [phone]  Subjective:   Heather Townsend is a 69 y.o. year old very pleasant female patient who presents with the following:  Diabetes mellitus II- controlled -has not lost any weight but has watched sweets/carbs and has worked hard on continued exercise. Her a1c has reduced from 7.3 to Lab Results  Component Value Date   HGBA1C 6.5 08/02/2014  Last time she checked her sugar was over a month ago and 2 hours postprandial including dessert was 156.  ROS- no blurry vision, no polyuria, no hypoglycemia  Hyperlipidemia-poor control  Lab Results  Component Value Date   LDLCALC 137* 03/07/2014   On statin: no Regular exercise: yes Diet: improving ROS- no chest pain or shortness of breath. No myalgias  Past Medical History- Patient Active Problem List   Diagnosis Date Noted  . Type 2 diabetes mellitus 03/08/2014    Priority: High  . NASH (nonalcoholic steatohepatitis) 04/04/2013    Priority: High  . Cervical disc disease 04/16/2014    Priority: Medium  . Dyslipidemia 08/16/2007    Priority: Medium  . Osteopenia 03/07/2014    Priority: Low  . Rash, skin 01/25/2013    Priority: Low  . Renal calculus or stone 04/27/2011    Priority: Low  . GERD 09/05/2007    Priority: Low  . Chest pain 05/11/2012  . HIATAL HERNIA 12/07/2006   Medications- reviewed and updated Current Outpatient Prescriptions  Medication Sig Dispense Refill  . dexlansoprazole (DEXILANT) 60 MG capsule Take 1 capsule (60 mg total) by mouth 2 (two) times a week. 30 capsule 5  . glucose blood (ONETOUCH VERIO) test strip Test daily. 100 each 12  . oxyCODONE-acetaminophen (PERCOCET/ROXICET) 5-325 MG per tablet Take 0.5 tablets by mouth daily as needed for pain.     Objective: BP 110/70 mmHg  Pulse 77  Temp(Src) 98.1 F (36.7 C)  Wt 159 lb (72.122 kg) Gen: NAD, resting comfortably CV: RRR no murmurs rubs or gallops Lungs: CTAB no crackles, wheeze, rhonchi Abdomen:  soft/nontender/nondistended/normal bowel sounds. Did not examine liver.  Ext: no edema Skin: warm, dry, no rash Neuro: grossly normal, moves all extremities, normal gait   Assessment/Plan:  Type 2 diabetes mellitus Excellent control without medication. Discussed needs to get eye exam. Follow up 6 months-continue weight loss/exercise/healthy eating efforts   Dyslipidemia Continues to refuse statin. Aware of cardiac risk.    Osteopenia Encouraged to schedule dexa front desk   6 month

## 2014-08-09 NOTE — Assessment & Plan Note (Signed)
Continues to refuse statin. Aware of cardiac risk.

## 2014-08-09 NOTE — Assessment & Plan Note (Signed)
Encouraged to schedule dexa front desk

## 2014-08-09 NOTE — Assessment & Plan Note (Signed)
Excellent control without medication. Discussed needs to get eye exam. Follow up 6 months-continue weight loss/exercise/healthy eating efforts

## 2014-08-09 NOTE — Patient Instructions (Addendum)
Schedule eye exam (tell them you have been told you have diabetes in the past)  Try to schedule your bone density at front desk. I can enter another order if needed.   Congratulations on getting your a1c down to 6.5 from 7.3. This is a HUGE improvement and often takes medication to improve this month. Still need to continue healthy eating/exercise.   Let's see you back in 6 months.

## 2014-08-22 ENCOUNTER — Ambulatory Visit (INDEPENDENT_AMBULATORY_CARE_PROVIDER_SITE_OTHER)
Admission: RE | Admit: 2014-08-22 | Discharge: 2014-08-22 | Disposition: A | Discharge status: Home / Self Care | Payer: Medicare Other | Source: Ambulatory Visit | Attending: Family Medicine | Admitting: Family Medicine

## 2014-08-22 DIAGNOSIS — M8580 Other specified disorders of bone density and structure, unspecified site: Secondary | ICD-10-CM | POA: Diagnosis not present

## 2014-09-05 ENCOUNTER — Other Ambulatory Visit (HOSPITAL_COMMUNITY): Payer: Self-pay | Admitting: Nurse Practitioner

## 2014-09-05 DIAGNOSIS — K7581 Nonalcoholic steatohepatitis (NASH): Principal | ICD-10-CM

## 2014-09-05 DIAGNOSIS — K7460 Unspecified cirrhosis of liver: Secondary | ICD-10-CM

## 2014-09-05 DIAGNOSIS — K7469 Other cirrhosis of liver: Secondary | ICD-10-CM | POA: Diagnosis not present

## 2014-09-18 ENCOUNTER — Ambulatory Visit (HOSPITAL_COMMUNITY)
Admission: RE | Admit: 2014-09-18 | Discharge: 2014-09-18 | Disposition: A | Discharge status: Home / Self Care | Payer: Medicare Other | Source: Ambulatory Visit | Attending: Nurse Practitioner | Admitting: Nurse Practitioner

## 2014-09-18 DIAGNOSIS — K7581 Nonalcoholic steatohepatitis (NASH): Secondary | ICD-10-CM | POA: Insufficient documentation

## 2014-09-18 DIAGNOSIS — K7460 Unspecified cirrhosis of liver: Secondary | ICD-10-CM | POA: Diagnosis not present

## 2014-11-12 ENCOUNTER — Encounter: Payer: Self-pay | Admitting: Gastroenterology

## 2014-12-04 DIAGNOSIS — R109 Unspecified abdominal pain: Secondary | ICD-10-CM | POA: Diagnosis not present

## 2014-12-04 DIAGNOSIS — K7460 Unspecified cirrhosis of liver: Secondary | ICD-10-CM | POA: Diagnosis not present

## 2014-12-04 DIAGNOSIS — N200 Calculus of kidney: Secondary | ICD-10-CM | POA: Diagnosis not present

## 2014-12-04 DIAGNOSIS — N201 Calculus of ureter: Secondary | ICD-10-CM | POA: Diagnosis not present

## 2014-12-04 DIAGNOSIS — N132 Hydronephrosis with renal and ureteral calculous obstruction: Secondary | ICD-10-CM | POA: Diagnosis not present

## 2014-12-05 DIAGNOSIS — R112 Nausea with vomiting, unspecified: Secondary | ICD-10-CM | POA: Diagnosis not present

## 2014-12-05 DIAGNOSIS — E119 Type 2 diabetes mellitus without complications: Secondary | ICD-10-CM | POA: Diagnosis not present

## 2014-12-05 DIAGNOSIS — R1084 Generalized abdominal pain: Secondary | ICD-10-CM | POA: Diagnosis not present

## 2014-12-05 DIAGNOSIS — N200 Calculus of kidney: Secondary | ICD-10-CM | POA: Diagnosis not present

## 2014-12-05 DIAGNOSIS — N132 Hydronephrosis with renal and ureteral calculous obstruction: Secondary | ICD-10-CM | POA: Diagnosis not present

## 2014-12-05 DIAGNOSIS — N1330 Unspecified hydronephrosis: Secondary | ICD-10-CM | POA: Diagnosis not present

## 2014-12-05 DIAGNOSIS — Z888 Allergy status to other drugs, medicaments and biological substances status: Secondary | ICD-10-CM | POA: Diagnosis not present

## 2014-12-06 DIAGNOSIS — N200 Calculus of kidney: Secondary | ICD-10-CM | POA: Diagnosis not present

## 2014-12-10 DIAGNOSIS — N200 Calculus of kidney: Secondary | ICD-10-CM | POA: Diagnosis not present

## 2014-12-10 DIAGNOSIS — Z6826 Body mass index (BMI) 26.0-26.9, adult: Secondary | ICD-10-CM | POA: Diagnosis not present

## 2014-12-14 ENCOUNTER — Other Ambulatory Visit: Payer: Self-pay | Admitting: Family Medicine

## 2014-12-14 MED ORDER — DEXLANSOPRAZOLE 60 MG PO CPDR: 60.0000 mg | DELAYED_RELEASE_CAPSULE | ORAL | Status: DC

## 2014-12-14 NOTE — Telephone Encounter (Signed)
[facility] requesting approval for refill of dexlansoprazole (DEXILANT) 60 MG capsule

## 2014-12-14 NOTE — Telephone Encounter (Signed)
Rx sent to pharmacy   

## 2015-01-17 DIAGNOSIS — M76821 Posterior tibial tendinitis, right leg: Secondary | ICD-10-CM | POA: Diagnosis not present

## 2015-02-12 DIAGNOSIS — M76821 Posterior tibial tendinitis, right leg: Secondary | ICD-10-CM | POA: Diagnosis not present

## 2015-02-14 ENCOUNTER — Ambulatory Visit: Payer: Medicare Other | Admitting: Family Medicine

## 2015-02-21 DIAGNOSIS — H25013 Cortical age-related cataract, bilateral: Secondary | ICD-10-CM | POA: Diagnosis not present

## 2015-02-21 DIAGNOSIS — K7581 Nonalcoholic steatohepatitis (NASH): Secondary | ICD-10-CM | POA: Diagnosis not present

## 2015-02-21 DIAGNOSIS — M25571 Pain in right ankle and joints of right foot: Secondary | ICD-10-CM | POA: Diagnosis not present

## 2015-02-21 DIAGNOSIS — Z79899 Other long term (current) drug therapy: Secondary | ICD-10-CM | POA: Diagnosis not present

## 2015-02-21 DIAGNOSIS — H35342 Macular cyst, hole, or pseudohole, left eye: Secondary | ICD-10-CM | POA: Diagnosis not present

## 2015-02-21 DIAGNOSIS — H18413 Arcus senilis, bilateral: Secondary | ICD-10-CM | POA: Diagnosis not present

## 2015-02-21 DIAGNOSIS — H1850 Unspecified hereditary corneal dystrophies: Secondary | ICD-10-CM | POA: Diagnosis not present

## 2015-02-21 DIAGNOSIS — H5213 Myopia, bilateral: Secondary | ICD-10-CM | POA: Diagnosis not present

## 2015-02-21 DIAGNOSIS — H52223 Regular astigmatism, bilateral: Secondary | ICD-10-CM | POA: Diagnosis not present

## 2015-02-21 DIAGNOSIS — H2513 Age-related nuclear cataract, bilateral: Secondary | ICD-10-CM | POA: Diagnosis not present

## 2015-02-21 DIAGNOSIS — H04123 Dry eye syndrome of bilateral lacrimal glands: Secondary | ICD-10-CM | POA: Diagnosis not present

## 2015-02-21 DIAGNOSIS — H1820 Unspecified corneal edema: Secondary | ICD-10-CM | POA: Diagnosis not present

## 2015-05-02 ENCOUNTER — Ambulatory Visit (INDEPENDENT_AMBULATORY_CARE_PROVIDER_SITE_OTHER): Payer: Medicare Other | Admitting: Family Medicine

## 2015-05-02 ENCOUNTER — Encounter: Payer: Self-pay | Admitting: Family Medicine

## 2015-05-02 VITALS — BP 120/78 | HR 84 | Temp 98.2°F | Ht 64.0 in | Wt 159.0 lb

## 2015-05-02 DIAGNOSIS — K7581 Nonalcoholic steatohepatitis (NASH): Secondary | ICD-10-CM | POA: Diagnosis not present

## 2015-05-02 DIAGNOSIS — Z0000 Encounter for general adult medical examination without abnormal findings: Secondary | ICD-10-CM

## 2015-05-02 DIAGNOSIS — E119 Type 2 diabetes mellitus without complications: Secondary | ICD-10-CM

## 2015-05-02 DIAGNOSIS — E785 Hyperlipidemia, unspecified: Secondary | ICD-10-CM | POA: Diagnosis not present

## 2015-05-02 LAB — LIPID PANEL
CHOL/HDL RATIO: 4
Cholesterol: 182 mg/dL (ref 0–200)
HDL: 41.7 mg/dL (ref >39.00)
LDL Cholesterol: 122 mg/dL — ABNORMAL HIGH (ref 0–99)
NONHDL: 140.78
Triglycerides: 93 mg/dL (ref 0.0–149.0)
VLDL: 18.6 mg/dL (ref 0.0–40.0)

## 2015-05-02 LAB — CBC
HCT: 47.8 % — ABNORMAL HIGH (ref 36.0–46.0)
HEMOGLOBIN: 16.1 g/dL — AB (ref 12.0–15.0)
MCHC: 33.7 g/dL (ref 30.0–36.0)
MCV: 92 fl (ref 78.0–100.0)
Platelets: 135 10*3/uL — ABNORMAL LOW (ref 150.0–400.0)
RBC: 5.2 Mil/uL — ABNORMAL HIGH (ref 3.87–5.11)
RDW: 13.9 % (ref 11.5–15.5)
WBC: 5.6 10*3/uL (ref 4.0–10.5)

## 2015-05-02 LAB — COMPREHENSIVE METABOLIC PANEL
ALK PHOS: 117 U/L (ref 39–117)
ALT: 106 U/L — ABNORMAL HIGH (ref 0–35)
AST: 95 U/L — AB (ref 0–37)
Albumin: 4 g/dL (ref 3.5–5.2)
BILIRUBIN TOTAL: 0.6 mg/dL (ref 0.2–1.2)
BUN: 9 mg/dL (ref 6–23)
CO2: 31 mEq/L (ref 19–32)
CREATININE: 0.77 mg/dL (ref 0.40–1.20)
Calcium: 9.9 mg/dL (ref 8.4–10.5)
Chloride: 102 mEq/L (ref 96–112)
GFR: 79.29 mL/min (ref >60.00)
GLUCOSE: 139 mg/dL — AB (ref 70–99)
Potassium: 4.4 mEq/L (ref 3.5–5.1)
SODIUM: 142 meq/L (ref 135–145)
TOTAL PROTEIN: 7.8 g/dL (ref 6.0–8.3)

## 2015-05-02 LAB — MICROALBUMIN / CREATININE URINE RATIO
CREATININE, U: 198.7 mg/dL
Microalb Creat Ratio: 0.8 mg/g (ref 0.0–30.0)
Microalb, Ur: 1.5 mg/dL (ref 0.0–1.9)

## 2015-05-02 LAB — HEMOGLOBIN A1C: HEMOGLOBIN A1C: 7.1 % — AB (ref 4.6–6.5)

## 2015-05-02 MED ORDER — GLUCOSE BLOOD VI STRP: ORAL_STRIP | Status: DC

## 2015-05-02 NOTE — Patient Instructions (Addendum)
  Heather Townsend , Thank you for taking time to come for your Medicare Wellness Visit. I appreciate your ongoing commitment to your health goals. Please review the following plan we discussed and let me know if I can assist you in the future.   These are the goals we discussed: 1. Sign release of information at the front desk for records from eye doctor 2. Labs before you leave 3.  Regular exercise for 150 minutes a week 4. Work towards weight under 150 5. Consider cholesterol and diabetes medicine to protect your liver further  This is a list of the screening recommended for you and due dates:  Health Maintenance  Topic Date Due  . Eye exam for diabetics  07/29/1957  . Hemoglobin A1C  02/02/2015  . Complete foot exam   04/17/2015  . Urine Protein Check  04/17/2015  . Flu Shot  03/07/2034*  . Shingles Vaccine  03/07/2034*  . Pneumonia vaccines (1 of 2 - PCV13) 08/09/2034*  . Mammogram  06/28/2016  . Tetanus Vaccine  07/30/2016  . Colon Cancer Screening  04/11/2018  . DEXA scan (bone density measurement)  Completed  .  Hepatitis C: One time screening is recommended by Center for Disease Control  (CDC) for  adults born from [birth year] through 1965.   Completed  *Topic was postponed. The date shown is not the original due date.

## 2015-05-02 NOTE — Progress Notes (Signed)
Heather Reddish, MD Phone: [phone]  Subjective:  Patient presents today for their annual wellness visit.    Preventive Screening-Counseling & Management  Smoking Status: former Smoker, quit 1988 Second Hand Smoking status: No smokers in home  Risk Factors Regular exercise: twice a week exercise. 2.5 hours of dancing Wt Readings from Last 3 Encounters:  05/02/15 159 lb (72.122 kg)  08/09/14 159 lb (72.122 kg)  04/16/14 159 lb (72.122 kg)  Diet: does tend to eat out a lot, discussed cutting back  Fall Risk: None   Cardiac risk factors: High Risk advanced age (older than 82 for men, 66 for women)  Hyperlipidemia - yes, untreated, encourage statin Lab Results  Component Value Date   CHOL 199 03/07/2014   HDL 41.80 03/07/2014   LDLCALC 137* 03/07/2014   LDLDIRECT 148.3 11/28/2012   TRIG 101.0 03/07/2014   CHOLHDL 5 03/07/2014  Known diabetes.  Lab Results  Component Value Date   HGBA1C 6.5 08/02/2014  Family History: heart disease in mother   Depression Screen None. PHQ2 0 0  Activities of Daily Living Independent ADLs and IADLs   Hearing Difficulties: -patient declines  Cognitive Testing No reported trouble.   Normal 3 word recall   List the Names of Other Physician/Practitioners you currently use: 1. [place] 2. Neurology in [place] -cannot recall eye exam 3. Urology Dr. Thomasene Mohair 2. Optho off [address]-Dr. Thurston Hole  Immunization History  Administered Date(s) Administered  . Td 07/31/2006   Required Immunizations needed today: Flu and Pneumonia but declines  Screening tests- up to date Health Maintenance Due  Topic Date Due  . OPHTHALMOLOGY EXAM - has already had, request records 07/29/1957  . HEMOGLOBIN A1C - today 02/02/2015  . FOOT EXAM - today  04/17/2015  . URINE MICROALBUMIN - today  04/17/2015   ROS- No pertinent positives discovered in course of AWV  The following were reviewed and entered/updated  in epic: Past Medical History  Diagnosis Date  . Low back pain   . Hepatitis, chronic active ([place])     NASH  . Diverticulosis   . Hyperlipidemia   . GERD (gastroesophageal reflux disease)   . Hiatal hernia   . Fatty infiltration of liver   . Allergy   . DIVERTICULOSIS, COLON 01/03/2007  . HIATAL HERNIA 12/07/2006   Patient Active Problem List   Diagnosis Date Noted  . Type 2 diabetes mellitus ([place]) 03/08/2014    Priority: High  . NASH (nonalcoholic steatohepatitis) 04/04/2013    Priority: High  . Cervical disc disease 04/16/2014    Priority: Medium  . GERD 09/05/2007    Priority: Medium  . Dyslipidemia 08/16/2007    Priority: Medium  . Osteopenia 03/07/2014    Priority: Low  . Rash, skin 01/25/2013    Priority: Low  . Chest pain 05/11/2012    Priority: Low  . Renal calculus or stone 04/27/2011    Priority: Low   Past Surgical History  Procedure Laterality Date  . Tubal ligation    . Appendectomy    . Tonsillectomy    . Uterine suspension    . Ganglion cysts      left hand  . Shoulder surgery  2006    rt shoulder  . Hand surgery  1996  . Eye surgery      macular hole    Family History  Problem Relation Age of Onset  . Heart disease Mother   . Diabetes Mother   . Pancreatic cancer Paternal  Uncle   . Colon cancer Paternal Uncle   . Stomach cancer Paternal Uncle   . Cancer Maternal Aunt     Stomach  . Esophageal cancer Maternal Aunt   . Esophageal cancer Maternal Uncle   . Rectal cancer Neg Hx     Medications- reviewed and updated Current Outpatient Prescriptions  Medication Sig Dispense Refill  . aspirin 81 MG tablet Take 81 mg by mouth daily.    Marland Kitchen dexlansoprazole (DEXILANT) 60 MG capsule Take 1 capsule (60 mg total) by mouth 2 (two) times a week. 30 capsule 5  . oxyCODONE-acetaminophen (PERCOCET/ROXICET) 5-325 MG per tablet Take 0.5 tablets by mouth daily as needed for pain.     No current facility-administered medications for this visit.     Allergies-reviewed and updated Allergies  Allergen Reactions  . Celecoxib     REACTION: Swelling  . Codeine   . Ibuprofen   . Meloxicam     REACTION: Swelling  . Rofecoxib   . Sulfonamide Derivatives     Social History   Social History  . Marital Status: Divorced    Spouse Name: N/A  . Number of Children: N/A  . Years of Education: N/A   Occupational History  . office manager    Social History Main Topics  . Smoking status: Former Smoker    Quit date: 06/01/1986  . Smokeless tobacco: Never Used     Comment: started in 1965-smoked less than 1 ppd  . Alcohol Use: Yes     Comment: rare  . Drug Use: No  . Sexual Activity: Not Currently   Other Topics Concern  . None   Social History Narrative   Divorced. Lives alone. 2 children (son in [place], daughter in [place]). 1 granddaughter in [place].    Works in Occupational hygienist for communities in school. Bookkeeping/purchasing, HR piece.     Objective: BP 120/78 mmHg  Pulse 84  Temp(Src) 98.2 F (36.8 C)  Ht 5\' 4"  (1.626 m)  Wt 159 lb (72.122 kg)  BMI 27.28 kg/m2 Gen: NAD, resting comfortably HEENT: Mucous membranes are moist. Oropharynx normal Neck: no thyromegaly CV: RRR no murmurs rubs or gallops Lungs: CTAB no crackles, wheeze, rhonchi Abdomen: soft/nontender/nondistended/normal bowel sounds. No rebound or guarding. Mildly overweight.  Ext: no edema Skin: warm, dry Neuro: grossly normal, moves all extremities, PERRLA  Diabetic Foot Exam - Simple   Simple Foot Form  Diabetic Foot exam was performed with the following findings:  Yes 05/02/2015  9:14 AM  Visual Inspection  No deformities, no ulcerations, no other skin breakdown bilaterally:  Yes  Sensation Testing  Intact to touch and monofilament testing bilaterally:  Yes  Pulse Check  Posterior Tibialis and Dorsalis pulse intact bilaterally:  Yes  Comments     Assessment/Plan:  Chronic Conditions NASH- S:followed [facility]. Refuses metformin though  a1c goal should be closer to 6 with NASH. Refuses Hep A and Hep B immunizations.  A/P:Encouraged need for healthy eating, regular exercise, weight loss.   HLD S:declines statin despite risks.poor control A/P:-  Advised aspirin as well - already doing  Type 2 diabetes- S:diet controlled A/P: needs to work on further weight loss. Reasonable diet control except for NASH goal of a1c closer to 6  Osteopenia- repeat DEXA 2018 or 2019  6 months  Orders Placed This Encounter  Procedures  . Hemoglobin A1c    [place]  . Microalbumin / creatinine urine ratio    [place]  . CBC    [place]  .  Comprehensive metabolic panel    [facility]    Order Specific Question:  Has the patient fasted?    Answer:  No  . Lipid panel    [place]    Order Specific Question:  Has the patient fasted?    Answer:  No    Meds ordered this encounter  Medications  . aspirin 81 MG tablet    Sig: Take 81 mg by mouth daily.

## 2015-05-02 NOTE — Addendum Note (Signed)
Addended by: Clyde Lundborg A on: 05/02/2015 09:26 AM   Modules accepted: Orders

## 2015-05-07 DIAGNOSIS — R748 Abnormal levels of other serum enzymes: Secondary | ICD-10-CM | POA: Diagnosis not present

## 2015-05-07 DIAGNOSIS — K7581 Nonalcoholic steatohepatitis (NASH): Secondary | ICD-10-CM | POA: Diagnosis not present

## 2015-05-08 ENCOUNTER — Other Ambulatory Visit: Payer: Self-pay | Admitting: Nurse Practitioner

## 2015-05-08 DIAGNOSIS — K7581 Nonalcoholic steatohepatitis (NASH): Secondary | ICD-10-CM

## 2015-05-17 ENCOUNTER — Other Ambulatory Visit: Payer: Medicare Other

## 2015-05-23 ENCOUNTER — Ambulatory Visit
Admission: RE | Admit: 2015-05-23 | Discharge: 2015-05-23 | Disposition: A | Discharge status: Home / Self Care | Payer: Medicare Other | Source: Ambulatory Visit | Attending: Nurse Practitioner | Admitting: Nurse Practitioner

## 2015-05-23 DIAGNOSIS — K7581 Nonalcoholic steatohepatitis (NASH): Secondary | ICD-10-CM

## 2015-05-23 DIAGNOSIS — K7460 Unspecified cirrhosis of liver: Secondary | ICD-10-CM | POA: Diagnosis not present

## 2015-06-13 DIAGNOSIS — R201 Hypoesthesia of skin: Secondary | ICD-10-CM | POA: Diagnosis not present

## 2015-06-13 DIAGNOSIS — G5603 Carpal tunnel syndrome, bilateral upper limbs: Secondary | ICD-10-CM | POA: Diagnosis not present

## 2015-06-13 DIAGNOSIS — G3184 Mild cognitive impairment, so stated: Secondary | ICD-10-CM | POA: Diagnosis not present

## 2015-06-13 DIAGNOSIS — M5416 Radiculopathy, lumbar region: Secondary | ICD-10-CM | POA: Diagnosis not present

## 2015-06-13 DIAGNOSIS — M5412 Radiculopathy, cervical region: Secondary | ICD-10-CM | POA: Diagnosis not present

## 2015-06-13 DIAGNOSIS — R252 Cramp and spasm: Secondary | ICD-10-CM | POA: Diagnosis not present

## 2015-06-13 DIAGNOSIS — R202 Paresthesia of skin: Secondary | ICD-10-CM | POA: Diagnosis not present

## 2015-07-11 ENCOUNTER — Ambulatory Visit (INDEPENDENT_AMBULATORY_CARE_PROVIDER_SITE_OTHER): Payer: Medicare Other | Admitting: Gastroenterology

## 2015-07-11 ENCOUNTER — Encounter: Payer: Self-pay | Admitting: Gastroenterology

## 2015-07-11 VITALS — BP 104/60 | HR 72 | Ht 64.5 in | Wt 174.0 lb

## 2015-07-11 DIAGNOSIS — R932 Abnormal findings on diagnostic imaging of liver and biliary tract: Secondary | ICD-10-CM | POA: Diagnosis not present

## 2015-07-11 DIAGNOSIS — K7460 Unspecified cirrhosis of liver: Secondary | ICD-10-CM | POA: Diagnosis not present

## 2015-07-11 NOTE — Progress Notes (Signed)
    History of Present Illness: This is a 70 year old female returning for EGD to screen for varices with a history of NASH cirrhosis. She is followed at [facility] for her liver disease by [name], NP and Genice Rouge, MD and I reviewed their 05/07/2015 office note. Last EGD in 04/2013 showed a small hiatal hernia. She is due for EGD to screen for varices. Abdominal ultrasound in 05/2015 and ultrasound with elastography in 08/2014 both ordered at [facility] with results below.  IMPRESSION: 1. No gallstones are noted within gallbladder. Normal CBD. 2. There is mild increased echogenicity of the liver with nodular contour consistent with cirrhosis. There is poorly visualized hypoechoic lesion in right hepatic lobe measures 1.5 x 1.3 cm. Further correlation with enhanced MRI is recommended.  IMPRESSION: Coarse hepatic echogenicity suggestive of cirrhosis. Median hepatic shear wave velocity is calculated at 3.68 m/sec. Corresponding Metavir fibrosis score is F3/F4. Risk of fibrosis is high.  Current Medications, Allergies, Past Medical History, Past Surgical History, Family History and Social History were reviewed in [employer] record.  Physical Exam: General: Well developed, well nourished, no acute distress Head: Normocephalic and atraumatic Eyes:  sclerae anicteric, EOMI Ears: Normal auditory acuity Mouth: No deformity or lesions Lungs: Clear throughout to auscultation Heart: Regular rate and rhythm; no murmurs, rubs or bruits Abdomen: Soft, non tender and non distended. No masses, hepatosplenomegaly or hernias noted. Normal Bowel sounds Musculoskeletal: Symmetrical with no gross deformities  Pulses:  Normal pulses noted Extremities: No clubbing, cyanosis, edema or deformities noted Neurological: Alert oriented x 4, grossly nonfocal Psychological:  Alert and cooperative. Normal mood and affect  Assessment and Recommendations:  1, NASH cirrhosis.  Management per [facility]. She is due for variceal screening with EGD. The risks (including bleeding, perforation, infection, missed lesions, medication reactions and possible hospitalization or surgery if complications occur), benefits, and alternatives to endoscopy with possible biopsy and possible dilation were discussed with the patient and they consent to proceed.   2. Abnormal abdominal ultrasound with poorly visualized right lobe lesion measuring 1.5 x 1.3 cm. Schedule MRI. Follow up and mgmt per [facility].   3. Personal history of adenomatous colon polyps. Five-year interval surveillance colonoscopy recommended in November 2019.

## 2015-07-11 NOTE — Patient Instructions (Addendum)
  You have been scheduled for an MRI at [facility] on 08/05/15. Your appointment time is 10:00am. Please arrive 15 minutes prior to your appointment time for registration purposes. Please make certain not to have anything to eat or drink 6 hours prior to your test. In addition, if you have any metal in your body, have a pacemaker or defibrillator, please be sure to let your ordering physician know. This test typically takes 45 minutes to 1 hour to complete.  You have been scheduled for an endoscopy. Please follow written instructions given to you at your visit today. If you use inhalers (even only as needed), please bring them with you on the day of your procedure. Your physician has requested that you go to www.startemmi.com and enter the access code given to you at your visit today. This web site gives a general overview about your procedure. However, you should still follow specific instructions given to you by our office regarding your preparation for the procedure.  Thank you for choosing me and [facility].  Pricilla Riffle. Dagoberto Ligas., MD., Marval Regal  cc: Allene Pyo, MD

## 2015-07-27 DIAGNOSIS — S1093XA Contusion of unspecified part of neck, initial encounter: Secondary | ICD-10-CM | POA: Diagnosis not present

## 2015-07-27 DIAGNOSIS — S40011A Contusion of right shoulder, initial encounter: Secondary | ICD-10-CM | POA: Diagnosis not present

## 2015-07-27 DIAGNOSIS — S199XXA Unspecified injury of neck, initial encounter: Secondary | ICD-10-CM | POA: Diagnosis not present

## 2015-07-27 DIAGNOSIS — S8002XA Contusion of left knee, initial encounter: Secondary | ICD-10-CM | POA: Diagnosis not present

## 2015-07-27 DIAGNOSIS — Z87891 Personal history of nicotine dependence: Secondary | ICD-10-CM | POA: Diagnosis not present

## 2015-07-27 DIAGNOSIS — E119 Type 2 diabetes mellitus without complications: Secondary | ICD-10-CM | POA: Diagnosis not present

## 2015-07-28 DIAGNOSIS — S199XXA Unspecified injury of neck, initial encounter: Secondary | ICD-10-CM | POA: Diagnosis not present

## 2015-08-02 ENCOUNTER — Encounter: Payer: Self-pay | Admitting: Gastroenterology

## 2015-08-02 ENCOUNTER — Ambulatory Visit (AMBULATORY_SURGERY_CENTER): Payer: Medicare Other | Admitting: Gastroenterology

## 2015-08-02 VITALS — BP 138/75 | HR 63 | Temp 97.3°F | Resp 15 | Ht 64.5 in | Wt 174.0 lb

## 2015-08-02 DIAGNOSIS — M545 Low back pain: Secondary | ICD-10-CM | POA: Diagnosis not present

## 2015-08-02 DIAGNOSIS — K7460 Unspecified cirrhosis of liver: Secondary | ICD-10-CM

## 2015-08-02 DIAGNOSIS — K219 Gastro-esophageal reflux disease without esophagitis: Secondary | ICD-10-CM | POA: Diagnosis not present

## 2015-08-02 DIAGNOSIS — K5730 Diverticulosis of large intestine without perforation or abscess without bleeding: Secondary | ICD-10-CM | POA: Diagnosis not present

## 2015-08-02 DIAGNOSIS — I8500 Esophageal varices without bleeding: Secondary | ICD-10-CM | POA: Diagnosis not present

## 2015-08-02 DIAGNOSIS — K449 Diaphragmatic hernia without obstruction or gangrene: Secondary | ICD-10-CM | POA: Diagnosis not present

## 2015-08-02 MED ORDER — SODIUM CHLORIDE 0.9 % IV SOLN: 500.0000 mL | INTRAVENOUS | Status: DC

## 2015-08-02 NOTE — Progress Notes (Signed)
Stable to RR 

## 2015-08-02 NOTE — Op Note (Signed)
[facility]  [address] [place], [address]   ENDOSCOPY PROCEDURE REPORT  PATIENT: Heather Townsend, Heather Townsend  MR#: [id number] BIRTHDATE: 01/10/1946 , 70  yrs. old GENDER: female ENDOSCOPIST: Ladene Artist, MD, [facility]  PROCEDURE DATE:  08/02/2015 PROCEDURE:  EGD, screening ASA CLASS:     Class III INDICATIONS:  screening for varices. MEDICATIONS: Monitored anesthesia care, Propofol 100 mg IV, and lidocaine 40 mg IV TOPICAL ANESTHETIC: none  DESCRIPTION OF PROCEDURE: After the risks benefits and alternatives of the procedure were thoroughly explained, informed consent was obtained.  The LB [id number] endoscope was introduced through the mouth and advanced to the second portion of the duodenum , Without limitations.  The instrument was slowly withdrawn as the mucosa was fully examined.    ESOPHAGUS: The mucosa of the esophagus appeared normal. No esophageal varices noted. STOMACH: The mucosa and folds of the stomach appeared normal. DUODENUM: The duodenal mucosa showed no abnormalities in the bulb and 2nd part of the duodenum.  Retroflexed views revealed a small hiatal hernia. No gastric varices noted.  The scope was then withdrawn from the patient and the procedure completed.  COMPLICATIONS: There were no immediate complications.  ENDOSCOPIC IMPRESSION: 1.   Small hiatal hernai 2.   The EGD otherwise appeared normal  RECOMMENDATIONS: 1.  Anti-reflux regimen 2.  Continue PPI 3.  Repeat Endoscopy in 2 years  [R  eSigned:  Ladene Artist, MD, [facility] 08/02/2015 10:15 AM

## 2015-08-02 NOTE — Patient Instructions (Signed)
YOU HAD AN ENDOSCOPIC PROCEDURE TODAY AT [facility]:   Refer to the procedure report that was given to you for any specific questions about what was found during the examination.  If the procedure report does not answer your questions, please call your gastroenterologist to clarify.  If you requested that your care partner not be given the details of your procedure findings, then the procedure report has been included in a sealed envelope for you to review at your convenience later.   Please Note:  You might notice some irritation and congestion in your nose or some drainage.  This is from the oxygen used during your procedure.  There is no need for concern and it should clear up in a day or so.  SYMPTOMS TO REPORT IMMEDIATELY:   Following upper endoscopy (EGD)  Vomiting of blood or coffee ground material  New chest pain or pain under the shoulder blades  Painful or persistently difficult swallowing  New shortness of breath  Fever of 100F or higher  Black, tarry-looking stools  For urgent or emergent issues, a gastroenterologist can be reached at any hour by calling [phone].   DIET: Your first meal following the procedure should be a small meal and then it is ok to progress to your normal diet. Heavy or fried foods are harder to digest and may make you feel nauseous or bloated.  Likewise, meals heavy in dairy and vegetables can increase bloating.  Drink plenty of fluids but you should avoid alcoholic beverages for 24 hours.  ACTIVITY:  You should plan to take it easy for the rest of today and you should NOT DRIVE or use heavy machinery until tomorrow (because of the sedation medicines used during the test).    FOLLOW UP: Our staff will call the number listed on your records the next business day following your procedure to check on you and address any questions or concerns that you may have regarding the information given to you following your procedure. If we do not  reach you, we will leave a message.  However, if you are feeling well and you are not experiencing any problems, there is no need to return our call.  We will assume that you have returned to your regular daily activities without incident.  If any biopsies were taken you will be contacted by phone or by letter within the next 1-3 weeks.  Please call us at [phone] if you have not heard about the biopsies in 3 weeks.    SIGNATURES/CONFIDENTIALITY: You and/or your care partner have signed paperwork which will be entered into your electronic medical record.  These signatures attest to the fact that that the information above on your After Visit Summary has been reviewed and is understood.  Full responsibility of the confidentiality of this discharge information lies with you and/or your care-partner.  Continue taking Dexilant Repeat Endoscopy in 2 years

## 2015-08-05 ENCOUNTER — Other Ambulatory Visit: Payer: Self-pay | Admitting: Gastroenterology

## 2015-08-05 ENCOUNTER — Telehealth: Payer: Self-pay | Admitting: *Deleted

## 2015-08-05 ENCOUNTER — Ambulatory Visit (HOSPITAL_COMMUNITY)
Admission: RE | Admit: 2015-08-05 | Discharge: 2015-08-05 | Disposition: A | Discharge status: Home / Self Care | Payer: Medicare Other | Source: Ambulatory Visit | Attending: Gastroenterology | Admitting: Gastroenterology

## 2015-08-05 DIAGNOSIS — K7460 Unspecified cirrhosis of liver: Secondary | ICD-10-CM | POA: Diagnosis not present

## 2015-08-05 DIAGNOSIS — N289 Disorder of kidney and ureter, unspecified: Secondary | ICD-10-CM | POA: Diagnosis not present

## 2015-08-05 DIAGNOSIS — K5710 Diverticulosis of small intestine without perforation or abscess without bleeding: Secondary | ICD-10-CM | POA: Diagnosis not present

## 2015-08-05 DIAGNOSIS — R932 Abnormal findings on diagnostic imaging of liver and biliary tract: Secondary | ICD-10-CM | POA: Diagnosis not present

## 2015-08-05 DIAGNOSIS — J8410 Pulmonary fibrosis, unspecified: Secondary | ICD-10-CM | POA: Diagnosis not present

## 2015-08-05 LAB — POCT I-STAT CREATININE: Creatinine, Ser: 0.7 mg/dL (ref 0.44–1.00)

## 2015-08-05 MED ORDER — GADOXETATE DISODIUM 0.25 MMOL/ML IV SOLN
5.0000 mL | Freq: Once | INTRAVENOUS | Status: AC | PRN
  Administered 2015-08-05: 8 mL via INTRAVENOUS

## 2015-08-05 NOTE — Telephone Encounter (Signed)
  Follow up Call-  Call back number 08/02/2015 04/19/2013 04/11/2013  Post procedure Call Back phone  # [phone] hm  Permission to leave phone message Yes Yes Yes     Patient questions:  Do you have a fever, pain , or abdominal swelling? No. Pain Score  0 *  Have you tolerated food without any problems? Yes.    Have you been able to return to your normal activities? Yes.    Do you have any questions about your discharge instructions: Diet   No. Medications  No. Follow up visit  No.  Do you have questions or concerns about your Care? No.  Actions: * If pain score is 4 or above: No action needed, pain <4.

## 2015-08-07 ENCOUNTER — Telehealth: Payer: Self-pay | Admitting: Family Medicine

## 2015-08-07 NOTE — Telephone Encounter (Signed)
Pt had MRI done and wants to discuss with you. Pt states there are some issues. First available was 03/17 , Friday at 3:15. Pt would like you to go over the results and advise if she needs to see you sooner.

## 2015-08-08 NOTE — Telephone Encounter (Signed)
See below

## 2015-08-08 NOTE — Telephone Encounter (Signed)
That date is fine for visit

## 2015-08-09 NOTE — Telephone Encounter (Signed)
Left message on personalized voice mail that appointment on 3/17 is ok per dr hunter.  No need to come in sooner.

## 2015-08-16 ENCOUNTER — Ambulatory Visit (INDEPENDENT_AMBULATORY_CARE_PROVIDER_SITE_OTHER): Payer: Medicare Other | Admitting: Family Medicine

## 2015-08-16 ENCOUNTER — Encounter: Payer: Self-pay | Admitting: Family Medicine

## 2015-08-16 VITALS — BP 126/90 | HR 81 | Temp 98.5°F | Resp 20 | Ht 64.5 in | Wt 163.0 lb

## 2015-08-16 DIAGNOSIS — R0602 Shortness of breath: Secondary | ICD-10-CM | POA: Insufficient documentation

## 2015-08-16 DIAGNOSIS — J8410 Pulmonary fibrosis, unspecified: Secondary | ICD-10-CM | POA: Diagnosis not present

## 2015-08-16 NOTE — Assessment & Plan Note (Signed)
S: Patient states she started to notice shortness of breath since December. Feels gradually worsening. First noted when going for a walk and felt winded far sooner than usual. Has also noted it at times with activity around the house. States she noted this even before MRI findings showing potential pulmonary fibrosis. No treatments tried. Never had anything like this before. She states her mother had pulmonary fibrosis. 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 She is very worried about pulmonary fibrosis. We discussed needed further imaging likely. To determine most appropriate imaging and follow up- have done an urgent referral to pulmonary given burden that is on patient as she awaits more information. She is not acutely ill today but information is weighing on her.

## 2015-08-16 NOTE — Progress Notes (Signed)
Garret Reddish, MD  Subjective:  Heather Townsend is a 70 y.o. year old very pleasant female patient who presents for/with See problem oriented charting ROS- RUQ pain at times. No chest pain. No diaphoresis, nausea, left neck or arm pain with shortness of breath.   Past Medical History-  Patient Active Problem List   Diagnosis Date Noted  . Shortness of breath 08/16/2015    Priority: High  . Type 2 diabetes mellitus ([place]) 03/08/2014    Priority: High  . NASH (nonalcoholic steatohepatitis) 04/04/2013    Priority: High  . Cervical disc disease 04/16/2014    Priority: Medium  . GERD 09/05/2007    Priority: Medium  . Dyslipidemia 08/16/2007    Priority: Medium  . Osteopenia 03/07/2014    Priority: Low  . Rash, skin 01/25/2013    Priority: Low  . Chest pain 05/11/2012    Priority: Low  . Renal calculus or stone 04/27/2011    Priority: Low    Medications- reviewed and updated Current Outpatient Prescriptions  Medication Sig Dispense Refill  . aspirin 81 MG tablet Take 81 mg by mouth daily.    Marland Kitchen dexlansoprazole (DEXILANT) 60 MG capsule Take 1 capsule (60 mg total) by mouth 2 (two) times a week. 30 capsule 5  . oxyCODONE-acetaminophen (PERCOCET/ROXICET) 5-325 MG per tablet Take 0.5 tablets by mouth daily as needed for pain.     No current facility-administered medications for this visit.    Objective: BP 126/90 mmHg  Pulse 81  Temp(Src) 98.5 F (36.9 C) (Oral)  Resp 20  Ht 5' 4.5" (1.638 m)  Wt 163 lb (73.936 kg)  BMI 27.56 kg/m2  SpO2 98% Gen: NAD, resting comfortably  Assessment/Plan:  Shortness of breath S: Patient states she started to notice shortness of breath since December. Feels gradually worsening. First noted when going for a walk and felt winded far sooner than usual. Has also noted it at times with activity around the house. States she noted this even before MRI findings showing potential pulmonary fibrosis. No treatments tried. Never had anything like this  before. She states her mother had pulmonary fibrosis. 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 She is very worried about pulmonary fibrosis. We discussed needed further imaging likely. To determine most appropriate imaging and follow up- have done an urgent referral to pulmonary given burden that is on patient as she awaits more information. She is not acutely ill today but information is weighing on her.   Return precautions advised.   Orders Placed This Encounter  Procedures  . Ambulatory referral to Pulmonology    Referral Priority:  Urgent    Referral Type:  Consultation    Referral Reason:  Specialty Services Required    Requested Specialty:  Pulmonary Disease    Number of Visits Requested:  1   Majority of visit spent comforting patient and discussing how further information is needed.   The duration of face-to-face time during this visit was 20 minutes. Greater than 50% of this time was spent in counseling, explanation of diagnosis, planning of further management, and/or coordination of care.

## 2015-08-16 NOTE — Progress Notes (Signed)
Pre visit review using our clinic review tool, if applicable. No additional management support is needed unless otherwise documented below in the visit note. 

## 2015-08-16 NOTE — Patient Instructions (Signed)
We will call you within a week about your referral to pulmonology. If you do not hear within 2 weeks, give [place] a call. I asked them to try to see you within 2 weeks if possible.

## 2015-09-05 ENCOUNTER — Other Ambulatory Visit (INDEPENDENT_AMBULATORY_CARE_PROVIDER_SITE_OTHER): Payer: Medicare Other

## 2015-09-05 ENCOUNTER — Encounter: Payer: Self-pay | Admitting: Pulmonary Disease

## 2015-09-05 ENCOUNTER — Ambulatory Visit (INDEPENDENT_AMBULATORY_CARE_PROVIDER_SITE_OTHER): Payer: Medicare Other | Admitting: Pulmonary Disease

## 2015-09-05 VITALS — BP 134/78 | HR 78 | Ht 64.5 in | Wt 163.0 lb

## 2015-09-05 DIAGNOSIS — R0602 Shortness of breath: Secondary | ICD-10-CM

## 2015-09-05 DIAGNOSIS — J8410 Pulmonary fibrosis, unspecified: Secondary | ICD-10-CM | POA: Diagnosis not present

## 2015-09-05 LAB — CBC WITH DIFFERENTIAL/PLATELET
BASOS ABS: 0 10*3/uL (ref 0.0–0.1)
Basophils Relative: 0.5 % (ref 0.0–3.0)
EOS PCT: 3.6 % (ref 0.0–5.0)
Eosinophils Absolute: 0.2 10*3/uL (ref 0.0–0.7)
HEMATOCRIT: 45.2 % (ref 36.0–46.0)
Hemoglobin: 15.5 g/dL — ABNORMAL HIGH (ref 12.0–15.0)
LYMPHS PCT: 27.3 % (ref 12.0–46.0)
Lymphs Abs: 1.9 10*3/uL (ref 0.7–4.0)
MCHC: 34.3 g/dL (ref 30.0–36.0)
MCV: 91.2 fl (ref 78.0–100.0)
MONOS PCT: 7.1 % (ref 3.0–12.0)
Monocytes Absolute: 0.5 10*3/uL (ref 0.1–1.0)
NEUTROS ABS: 4.3 10*3/uL (ref 1.4–7.7)
Neutrophils Relative %: 61.5 % (ref 43.0–77.0)
PLATELETS: 157 10*3/uL (ref 150.0–400.0)
RBC: 4.96 Mil/uL (ref 3.87–5.11)
RDW: 14.3 % (ref 11.5–15.5)
WBC: 6.9 10*3/uL (ref 4.0–10.5)

## 2015-09-05 LAB — COMPREHENSIVE METABOLIC PANEL
ALK PHOS: 120 U/L — AB (ref 39–117)
ALT: 118 U/L — AB (ref 0–35)
AST: 128 U/L — ABNORMAL HIGH (ref 0–37)
Albumin: 4.3 g/dL (ref 3.5–5.2)
BILIRUBIN TOTAL: 0.6 mg/dL (ref 0.2–1.2)
BUN: 11 mg/dL (ref 6–23)
CALCIUM: 10 mg/dL (ref 8.4–10.5)
CO2: 31 meq/L (ref 19–32)
Chloride: 100 mEq/L (ref 96–112)
Creatinine, Ser: 0.7 mg/dL (ref 0.40–1.20)
GFR: 88.42 mL/min (ref >60.00)
Glucose, Bld: 103 mg/dL — ABNORMAL HIGH (ref 70–99)
POTASSIUM: 3.5 meq/L (ref 3.5–5.1)
Sodium: 140 mEq/L (ref 135–145)
TOTAL PROTEIN: 8.4 g/dL — AB (ref 6.0–8.3)

## 2015-09-05 LAB — RHEUMATOID FACTOR

## 2015-09-05 LAB — C-REACTIVE PROTEIN: CRP: 0.4 mg/dL — AB (ref 0.5–20.0)

## 2015-09-05 LAB — SEDIMENTATION RATE: SED RATE: 43 mm/h — AB (ref 0–22)

## 2015-09-05 NOTE — Patient Instructions (Signed)
We will schedule you for a high-resolution CT of the chest and pulmonary function tests. Will send blood work to check for interstitial lung disease and connective tissue disease.  Return to clinic in 1 month

## 2015-09-05 NOTE — Progress Notes (Signed)
   Subjective:    Patient ID: Heather Townsend, female    DOB: 01-08-1946, 70 y.o.   MRN: [id number]  HPI Consult for evaluation of abnormal lung imaging.  Mrs. Hedges is a 70 year old with history of Karlene Lineman cirrhosis, GERD, hiatal hernia. She had an MRI of the abdomen as a part of follow-up for cirrhosis. It showed fibrosis at lung bases. She has been referred here for further follow-up. She has some mild dyspnea on exertion but no other symptoms confined to the chest. She denies any cough, wheezing, sputum production, hemoptysis. She does not have any symptoms suggestive of connective tissue, autoimmune disease. She does have history of unspecified lung fibrosis in her mother. She does not have any exposures at work to asbestos or other inhalants.  DATA: CT abdomen 12/04/14- Reticulation at bases, unchanged since 2016 CT abdomen 06/02/11- Reticulation at bases. Images reviewed.  Social History: She has a 15-pack-year smoking history. Quit in 1988. 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  Family History: Mother-emphysema, heart disease.  Review of Systems Dyspnea on exertion, no cough, sputum production, wheezing, hemoptysis. No chest pain, palpitation. No rash, joint pain, joint swelling, joint stiffness. No nausea or vomiting, diarrhea, constipation. No fevers, chills, loss of weight, loss of appetite. All other review of systems negative     Objective:   Physical Exam Blood pressure 134/78, pulse 78, height 5' 4.5" (1.638 m), weight 163 lb (73.936 kg), SpO2 97 %. Gen: No apparent distress Neuro: No gross focal deficits. HEENT: No JVD, lymphadenopathy, thyromegaly. RS: Clear, No wheeze or crackles CVS: S1-S2 heard, no murmurs rubs gallops. Abdomen: Soft, positive bowel sounds. Musculoskeletal: No edema.    Assessment & Plan:  Basal pulmonary fibrosis Review of her CT scans of abd show that she had basal reticulation as far back as  2013. This has been stable on a repeat CT of the abdomen and 2016. She does not have any exposure history or symptoms suggestive of connective tissue, autoimmune disease. She does have a family history of unspecified pulmonary fibrosis in the mother.  I'll evaluate with a high-resolution CT of the chest, pulmonary function tests and serologies for autoimmune, connective tissue disease. She'll return to clinic in 1 month to discuss results and further follow-up if needed.  Plan: - HRCT of chest, PFTs - Serologies for autoimmune, connective tissue disease.   Marshell Garfinkel MD [facility] Pager [phone] If no answer or after 3pm call: [phone] 09/05/2015, 4:39 PM

## 2015-09-06 LAB — SJOGREN'S SYNDROME ANTIBODS(SSA + SSB)
SSA (Ro) (ENA) Antibody, IgG: <1
SSB (La) (ENA) Antibody, IgG: <1

## 2015-09-06 LAB — ANTI-DNA ANTIBODY, DOUBLE-STRANDED: DS DNA AB: 6 [IU]/mL — AB

## 2015-09-06 LAB — ANCA SCREEN W REFLEX TITER: ANCA SCREEN: POSITIVE — AB

## 2015-09-06 LAB — RFLX P-ANCA TITER: P-ANCA: 1:40 {titer} — ABNORMAL HIGH

## 2015-09-06 LAB — JO-1 ANTIBODY-IGG: Jo-1 Antibody, IgG: <1

## 2015-09-06 LAB — CENTROMERE ANTIBODIES: Centromere Ab Screen: <1

## 2015-09-06 LAB — CYCLIC CITRUL PEPTIDE ANTIBODY, IGG: Cyclic Citrullin Peptide Ab: <16 Units

## 2015-09-06 LAB — ANTI-SMITH ANTIBODY: ENA SM AB SER-ACNC: NEGATIVE

## 2015-09-06 LAB — ANTI-SCLERODERMA ANTIBODY: SCLERODERMA (SCL-70) (ENA) ANTIBODY, IGG: NEGATIVE

## 2015-09-06 LAB — RNP ANTIBODY: RIBONUCLEIC PROTEIN(ENA) ANTIBODY, IGG: NEGATIVE

## 2015-09-09 LAB — ALDOLASE: Aldolase: 11.2 U/L — ABNORMAL HIGH (ref <=8.1)

## 2015-09-12 ENCOUNTER — Ambulatory Visit (INDEPENDENT_AMBULATORY_CARE_PROVIDER_SITE_OTHER)
Admission: RE | Admit: 2015-09-12 | Discharge: 2015-09-12 | Disposition: A | Discharge status: Home / Self Care | Payer: Medicare Other | Source: Ambulatory Visit | Attending: Pulmonary Disease | Admitting: Pulmonary Disease

## 2015-09-12 ENCOUNTER — Ambulatory Visit (HOSPITAL_COMMUNITY)
Admission: RE | Admit: 2015-09-12 | Discharge: 2015-09-12 | Disposition: A | Discharge status: Home / Self Care | Payer: Medicare Other | Source: Ambulatory Visit | Attending: Pulmonary Disease | Admitting: Pulmonary Disease

## 2015-09-12 DIAGNOSIS — R0602 Shortness of breath: Secondary | ICD-10-CM | POA: Insufficient documentation

## 2015-09-12 DIAGNOSIS — R911 Solitary pulmonary nodule: Secondary | ICD-10-CM | POA: Diagnosis not present

## 2015-09-12 DIAGNOSIS — J8410 Pulmonary fibrosis, unspecified: Secondary | ICD-10-CM | POA: Diagnosis not present

## 2015-09-12 LAB — PULMONARY FUNCTION TEST
DL/VA % PRED: 79 %
DL/VA: 3.8 ml/min/mmHg/L
DLCO unc % pred: 53 %
DLCO unc: 12.99 ml/min/mmHg
FEF 25-75 POST: 2.86 L/s
FEF 25-75 Pre: 3.38 L/sec
FEF2575-%Change-Post: -15 %
FEF2575-%PRED-POST: 143 %
FEF2575-%Pred-Pre: 169 %
FEV1-%Change-Post: -3 %
FEV1-%PRED-PRE: 93 %
FEV1-%Pred-Post: 90 %
FEV1-PRE: 2.18 L
FEV1-Post: 2.11 L
FEV1FVC-%CHANGE-POST: 0 %
FEV1FVC-%Pred-Pre: 117 %
FEV6-%Change-Post: -3 %
FEV6-%PRED-PRE: 83 %
FEV6-%Pred-Post: 80 %
FEV6-Post: 2.37 L
FEV6-Pre: 2.45 L
FEV6FVC-%Pred-Post: 104 %
FEV6FVC-%Pred-Pre: 104 %
FVC-%Change-Post: -3 %
FVC-%PRED-POST: 77 %
FVC-%PRED-PRE: 80 %
FVC-POST: 2.37 L
FVC-PRE: 2.45 L
POST FEV6/FVC RATIO: 100 %
PRE FEV1/FVC RATIO: 89 %
Post FEV1/FVC ratio: 89 %
Pre FEV6/FVC Ratio: 100 %
RV % pred: 73 %
RV: 1.58 L
TLC % pred: 79 %
TLC: 4.01 L

## 2015-09-12 LAB — MYOSITIS PANEL III
EJ: NEGATIVE
Jo-1 (WB)*: NEGATIVE
KU: NEGATIVE
MI-2 ANTIBODIES: NEGATIVE
OJ*: NEGATIVE
PL-12*: NEGATIVE
PL-7*: NEGATIVE
PM-SCL 100: NEGATIVE
PM-Scl 75*: NEGATIVE
RNP: 34.4 EU/ml — AB
RO-52*: NEGATIVE
SIGNAL RECOGNITION PARTICLE: NEGATIVE

## 2015-09-12 LAB — ANA COMPREHENSIVE PANEL
Anti JO-1: <0.2 AI (ref 0.0–0.9)
Centromere Ab Screen: <0.2 AI (ref 0.0–0.9)
Chromatin Ab SerPl-aCnc: <0.2 AI (ref 0.0–0.9)
ENA SM Ab Ser-aCnc: 0.2 AI (ref 0.0–0.9)
ENA SSA (RO) Ab: <0.2 AI (ref 0.0–0.9)
Scleroderma SCL-70: <0.2 AI (ref 0.0–0.9)
dsDNA Ab: 6 IU/mL (ref 0–9)

## 2015-09-12 MED ORDER — ALBUTEROL SULFATE (2.5 MG/3ML) 0.083% IN NEBU
2.5000 mg | INHALATION_SOLUTION | Freq: Once | RESPIRATORY_TRACT | Status: AC
  Administered 2015-09-12: 2.5 mg via RESPIRATORY_TRACT

## 2015-09-18 NOTE — Progress Notes (Signed)
Quick Note:  Called spoke with patient, advised of results / recs as stated by PM. Pt verbalized her understanding and denied any questions. ______

## 2015-10-07 ENCOUNTER — Ambulatory Visit (INDEPENDENT_AMBULATORY_CARE_PROVIDER_SITE_OTHER): Payer: Medicare Other | Admitting: Pulmonary Disease

## 2015-10-07 ENCOUNTER — Encounter: Payer: Self-pay | Admitting: Pulmonary Disease

## 2015-10-07 VITALS — BP 116/82 | HR 73 | Ht 64.5 in | Wt 165.4 lb

## 2015-10-07 DIAGNOSIS — J8410 Pulmonary fibrosis, unspecified: Secondary | ICD-10-CM | POA: Diagnosis not present

## 2015-10-07 DIAGNOSIS — R0602 Shortness of breath: Secondary | ICD-10-CM

## 2015-10-07 DIAGNOSIS — D383 Neoplasm of uncertain behavior of mediastinum: Secondary | ICD-10-CM | POA: Diagnosis not present

## 2015-10-07 DIAGNOSIS — D4989 Neoplasm of unspecified behavior of other specified sites: Secondary | ICD-10-CM

## 2015-10-07 NOTE — Patient Instructions (Signed)
We will order a thyroid ultrasound to evaluate the anterior mediastinal nodule seen on the CT scan. We will follow with a repeat CT scan in one year. If you develop worsening shortness of breath then let us know and we will evaluate sooner.  Return to clinic in 6 months.

## 2015-10-07 NOTE — Progress Notes (Addendum)
Subjective:    Patient ID: Heather Townsend, female    DOB: 12-06-1945, 70 y.o.   MRN: [id number]  HPI Follow up for lung fibrosis.  Heather Townsend is a 70 year old with history of Heather Townsend cirrhosis, GERD, hiatal hernia. She had an MRI of the abdomen as a part of follow-up for cirrhosis. It showed fibrosis at lung bases. She has been referred here for further follow-up. She has some mild dyspnea on exertion but no other symptoms confined to the chest. She denies any cough, wheezing, sputum production, hemoptysis. She does not have any symptoms suggestive of connective tissue, autoimmune disease. She does have history of unspecified lung fibrosis in her mother. She does not have any exposures at work to asbestos or other inhalants.  DATA: CT abdomen 12/04/14- Reticulation at bases, unchanged since 2011 CT abdomen 06/02/11- Reticulation at bases. Images reviewed. High res CT 09/29/15-images reviewed. ILD, subpleural reticulation, traction bronchiectasis. 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  Serologies 09/05/15 CRP 0.4, sedimentation rate 43 Aldolase 11.2 P-ANCA 1:40 dsDNA- 6 Negative- Jo-1, centromere, RNP, RO, LA RA, CCP, Scl 70  PFTs 09/29/15 FVC 2.45 (80%) FEV1 2.18 (93%) F/F 89 TLC 79% DLCO 53% Mild restriction, moderately severe diffusion defect.  Social History: She has a 15-pack-year smoking history. Quit in 1988. 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  Family History: Mother-emphysema, heart disease.  Past Medical History  Diagnosis Date  . Low back pain   . Hepatitis, chronic active ([place])     NASH  . Diverticulosis   . Hyperlipidemia   . GERD (gastroesophageal reflux disease)   . Hiatal hernia   . Fatty infiltration of liver   . Allergy   . DIVERTICULOSIS, COLON 01/03/2007  . HIATAL HERNIA 12/07/2006     Current outpatient prescriptions:  .  aspirin 81 MG tablet, Take 81 mg by mouth daily., Disp: , Rfl:  .  dexlansoprazole (DEXILANT) 60 MG  capsule, Take 1 capsule (60 mg total) by mouth 2 (two) times a week., Disp: 30 capsule, Rfl: 5 .  oxyCODONE-acetaminophen (PERCOCET/ROXICET) 5-325 MG per tablet, Take 0.5 tablets by mouth daily as needed for pain., Disp: , Rfl:    Review of Systems Dyspnea on exertion, no cough, sputum production, wheezing, hemoptysis. No chest pain, palpitation. No rash, joint pain, joint swelling, joint stiffness. No nausea or vomiting, diarrhea, constipation. No fevers, chills, loss of weight, loss of appetite. All other review of systems negative     Objective:   Physical Exam Blood pressure 116/82, pulse 73, height 5' 4.5" (1.638 m), weight 165 lb 6.4 oz (75.025 kg), SpO2 96 %. Gen: No apparent distress Neuro: No gross focal deficits. HEENT: No JVD, lymphadenopathy, thyromegaly. RS: Clear, No wheeze or crackles CVS: S1-S2 heard, no murmurs rubs gallops. Abdomen: Soft, positive bowel sounds. Musculoskeletal: No edema.    Assessment & Plan:  Basal pulmonary fibrosis All of her lab studies, imaging, PFTs were reviewed with her today. Her CT does show lung fibrosis in UIP versus NSIP pattern. However this is stable compared to 2011. She does not have any exposure history or symptoms suggestive of connective tissue, autoimmune disease. Serologies are negative except for intermediate elevation in double-stranded DNA and mild elevation in ANCA of unclear significance. She does have a family history of unspecified pulmonary fibrosis in the mother.  I had a long discussion about further follow-up with her including getting a biopsy and putting her on one of the  newer treatments for IPF. She is not thrilled about getting a biopsy or going on additional medications so we have decided to wait and watch. We will continue to evaluate her with a repeat PFTs and CT scan in a year. If there is progression of disease or worsening symptoms then I will discuss getting a biopsy and treatment with her again. She will also  get a thyroid u/s to evaluate the mediastinal nodule.  Plan: - Follow up CT scan, PFTs in 1 year. Sooner if her symptoms worsen. - Thyroid u/s.  Return in 1 month.  Marshell Garfinkel MD [facility] Pager [phone] If no answer or after 3pm call: [phone] 10/07/2015, 3:59 PM   Marshell Garfinkel MD [facility] Pager [phone] If no answer or after 3pm call: [phone] 10/07/2015, 3:44 PM

## 2015-10-14 ENCOUNTER — Other Ambulatory Visit: Payer: Self-pay | Admitting: Pulmonary Disease

## 2015-10-14 ENCOUNTER — Ambulatory Visit (HOSPITAL_COMMUNITY)
Admission: RE | Admit: 2015-10-14 | Discharge: 2015-10-14 | Disposition: A | Discharge status: Home / Self Care | Payer: Medicare Other | Source: Ambulatory Visit | Attending: Pulmonary Disease | Admitting: Pulmonary Disease

## 2015-10-14 DIAGNOSIS — D383 Neoplasm of uncertain behavior of mediastinum: Secondary | ICD-10-CM | POA: Insufficient documentation

## 2015-10-14 DIAGNOSIS — E041 Nontoxic single thyroid nodule: Secondary | ICD-10-CM | POA: Diagnosis not present

## 2015-10-14 DIAGNOSIS — D4989 Neoplasm of unspecified behavior of other specified sites: Secondary | ICD-10-CM

## 2015-10-21 ENCOUNTER — Telehealth: Payer: Self-pay | Admitting: Family Medicine

## 2015-10-21 NOTE — Telephone Encounter (Signed)
Appointment has been changed to a 15 min. Thank you Dr Yong Channel!

## 2015-10-21 NOTE — Telephone Encounter (Signed)
Pt was instructed to follow up with you from pulmonary /thyroid test. No appointments until 6/8.  Pt can come in on 6/7. Ok to use the 9:15 acute slot and make a 30 minute?  Pt states they are going to send you results.

## 2015-10-21 NOTE — Telephone Encounter (Signed)
I am fine if you leave it as a 15 minute appointment and leave another acute available. THanks for scheduling her!

## 2015-11-06 ENCOUNTER — Encounter: Payer: Self-pay | Admitting: Family Medicine

## 2015-11-06 ENCOUNTER — Ambulatory Visit (INDEPENDENT_AMBULATORY_CARE_PROVIDER_SITE_OTHER): Payer: Medicare Other | Admitting: Family Medicine

## 2015-11-06 VITALS — BP 128/78 | HR 82 | Temp 98.7°F | Ht 64.5 in | Wt 165.0 lb

## 2015-11-06 DIAGNOSIS — K219 Gastro-esophageal reflux disease without esophagitis: Secondary | ICD-10-CM

## 2015-11-06 DIAGNOSIS — E041 Nontoxic single thyroid nodule: Secondary | ICD-10-CM | POA: Diagnosis not present

## 2015-11-06 LAB — TSH: TSH: 3.01 u[IU]/mL (ref 0.35–4.50)

## 2015-11-06 LAB — T3, FREE: T3 FREE: 3.4 pg/mL (ref 2.3–4.2)

## 2015-11-06 LAB — T4, FREE: FREE T4: 0.59 ng/dL — AB (ref 0.60–1.60)

## 2015-11-06 NOTE — Assessment & Plan Note (Signed)
S:Dexilant through [name] Plan. apparently barium swallow in past for feeling like food getting stuck was why she started dexilant. Felt breathing better when off medicine and has stayed off and feels better for 3 weeks. Takes tums with reasonable relief. She has had no difficulty swallowing A/P: We will continue off dexilant For now. She can use Tums. If worsening symptoms she can try Zantac and she will let me know if she fails this

## 2015-11-06 NOTE — Patient Instructions (Signed)
Labs before you leave  We will call you within a week about your referral to endocrinology to follow up thyroid nodule. If you do not hear within 2 weeks, give [place] a call.   I am ok with you sticking with TUms. If that doesn't work could trial zantac over the counter

## 2015-11-06 NOTE — Assessment & Plan Note (Signed)
S: Patient was being worked up for pulmonary fibrosis and nodule in thyroid noted in process. Follow up ultrasound showed 10/14/15 "Solitary left lower pole nodule measures 1.6 x 0.9 x 1.8 cm. Findings meet consensus criteria for biopsy. Ultrasound-guided fine needle aspiration should be considered, as per the consensus statement". Patient presents today to establish plan for this. She is largely asymptomatic- see ROS below. Not on any thyrodi medication.  Lab Results  Component Value Date   TSH 4.08 03/07/2014  ROS-No hair or nail changes. No heat/cold intolerance- occasional hot flash. mild constipation when off dexilant. no diarrhea. Denies shakiness or anxiety.  A/P: update TSH and get  t3, t4. Discussed IR for biopsy or refer to endocrine, she prefers endocrine as they can help manage afterwards- referral provided today

## 2015-11-06 NOTE — Progress Notes (Signed)
Pre visit review using our clinic review tool, if applicable. No additional management support is needed unless otherwise documented below in the visit note. 

## 2015-11-06 NOTE — Progress Notes (Signed)
Subjective:  Heather Townsend is a 70 y.o. year old very pleasant female patient who presents for/with See problem oriented charting ROS- no chest pain. Less shortness of breath.see any ROS included in HPI as well.   Past Medical History-  Patient Active Problem List   Diagnosis Date Noted  . Shortness of breath 08/16/2015    Priority: High  . Type 2 diabetes mellitus ([place]) 03/08/2014    Priority: High  . NASH (nonalcoholic steatohepatitis) 04/04/2013    Priority: High  . Cervical disc disease 04/16/2014    Priority: Medium  . GERD 09/05/2007    Priority: Medium  . Dyslipidemia 08/16/2007    Priority: Medium  . Osteopenia 03/07/2014    Priority: Low  . Rash, skin 01/25/2013    Priority: Low  . Chest pain 05/11/2012    Priority: Low  . Renal calculus or stone 04/27/2011    Priority: Low  . Left thyroid nodule 11/06/2015    Medications- reviewed and updated Current Outpatient Prescriptions  Medication Sig Dispense Refill  . aspirin 81 MG tablet Take 81 mg by mouth daily.    Marland Kitchen oxyCODONE-acetaminophen (PERCOCET/ROXICET) 5-325 MG per tablet Take 0.5 tablets by mouth daily as needed for pain.     No current facility-administered medications for this visit.    Objective: BP 128/78 mmHg  Pulse 82  Temp(Src) 98.7 F (37.1 C) (Oral)  Ht 5' 4.5" (1.638 m)  Wt 165 lb (74.844 kg)  BMI 27.90 kg/m2  SpO2 96% Gen: NAD, resting comfortably No thyromegaly- mild bulge in left lower side of thyroid gland though do not measure at 2 cm, less than 1 cm to my hands.  CV: RRR no murmurs rubs or gallops Lungs: CTAB no crackles, wheeze, rhonchi Ext: no edema Skin: warm, dry Neuro: grossly normal, moves all extremities  Assessment/Plan:  Left thyroid nodule S: Patient was being worked up for pulmonary fibrosis and nodule in thyroid noted in process. Follow up ultrasound showed 10/14/15 "Solitary left lower pole nodule measures 1.6 x 0.9 x 1.8 cm. Findings meet consensus criteria for  biopsy. Ultrasound-guided fine needle aspiration should be considered, as per the consensus statement". Patient presents today to establish plan for this. She is largely asymptomatic- see ROS below. Not on any thyrodi medication.  Lab Results  Component Value Date   TSH 4.08 03/07/2014  ROS-No hair or nail changes. No heat/cold intolerance- occasional hot flash. mild constipation when off dexilant. no diarrhea. Denies shakiness or anxiety.  A/P: update TSH and get  t3, t4. Discussed IR for biopsy or refer to endocrine, she prefers endocrine as they can help manage afterwards- referral provided today  GERD S:Dexilant through [name] Plan. apparently barium swallow in past for feeling like food getting stuck was why she started dexilant. Felt breathing better when off medicine and has stayed off and feels better for 3 weeks. Takes tums with reasonable relief. She has had no difficulty swallowing A/P: We will continue off dexilant For now. She can use Tums. If worsening symptoms she can try Zantac and she will let me know if she fails this  Return precautions advised.   Orders Placed This Encounter  Procedures  . TSH    [place]  . T4, free    [place]  . T3, free  . Ambulatory referral to Endocrinology    Referral Priority:  Routine    Referral Type:  Consultation    Referral Reason:  Specialty Services Required    Number of Visits Requested:  1  established problem with med management- stop dexilant. New problem uncertain prognosis, refer but no med management.   Garret Reddish, MD

## 2015-11-21 DIAGNOSIS — K7581 Nonalcoholic steatohepatitis (NASH): Secondary | ICD-10-CM | POA: Diagnosis not present

## 2015-12-04 DIAGNOSIS — K7581 Nonalcoholic steatohepatitis (NASH): Secondary | ICD-10-CM | POA: Diagnosis not present

## 2015-12-04 DIAGNOSIS — R748 Abnormal levels of other serum enzymes: Secondary | ICD-10-CM | POA: Diagnosis not present

## 2015-12-05 ENCOUNTER — Other Ambulatory Visit: Payer: Self-pay | Admitting: Nurse Practitioner

## 2015-12-05 DIAGNOSIS — K7581 Nonalcoholic steatohepatitis (NASH): Secondary | ICD-10-CM

## 2015-12-10 ENCOUNTER — Ambulatory Visit (INDEPENDENT_AMBULATORY_CARE_PROVIDER_SITE_OTHER): Payer: Medicare Other | Admitting: Endocrinology

## 2015-12-10 ENCOUNTER — Encounter: Payer: Self-pay | Admitting: Endocrinology

## 2015-12-10 VITALS — BP 122/84 | HR 82 | Ht 64.5 in | Wt 164.0 lb

## 2015-12-10 DIAGNOSIS — E041 Nontoxic single thyroid nodule: Secondary | ICD-10-CM

## 2015-12-10 MED ORDER — LEVOTHYROXINE SODIUM 25 MCG PO TABS: 25.0000 ug | ORAL_TABLET | Freq: Every day | ORAL | Status: DC

## 2015-12-10 NOTE — Progress Notes (Signed)
Subjective:    Patient ID: Heather Townsend, female    DOB: Jan 23, 1946, 70 y.o.   MRN: [id number]  HPI In 2017, on a CT scan pt was incidentally noted to have a slight nodule at the thyroid.  No assoc pain.  she is unaware of ever having had thyroid problems in the past.  she has no h/o XRT or surgery to the neck.   Past Medical History  Diagnosis Date  . Low back pain   . 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 Diverticulosis   . Hyperlipidemia   . GERD (gastroesophageal reflux disease)   . Hiatal hernia   . Fatty infiltration of liver   . Allergy   . DIVERTICULOSIS, COLON 01/03/2007  . HIATAL HERNIA 12/07/2006    Past Surgical History  Procedure Laterality Date  . Tubal ligation    . Appendectomy    . Tonsillectomy    . Uterine suspension    . Ganglion cysts      left hand  . Shoulder surgery  2006    rt shoulder  . Hand surgery  1996  . Eye surgery      macular hole    Social History   Social History  . Marital Status: Divorced    Spouse Name: N/A  . Number of Children: N/A  . Years of Education: N/A   Occupational History  . office manager    Social History Main Topics  . Smoking status: Former Smoker -- 0.75 packs/day for 15 years    Types: Cigarettes    Quit date: 06/01/1986  . Smokeless tobacco: Never Used  . Alcohol Use: 0.0 oz/week    0 Standard drinks or equivalent per week     Comment: rare  . Drug Use: No  . Sexual Activity: Not Currently   Other Topics Concern  . Not on file   Social History Narrative   Divorced. Lives alone. 2 children (son in [place], daughter in [place]). 1 granddaughter in [place].    Works in Occupational hygienist for communities in school. Bookkeeping/purchasing, HR piece.     Current Outpatient Prescriptions on File Prior to Visit  Medication Sig Dispense Refill  . aspirin 81 MG tablet Take 81 mg by mouth as needed.     Marland Kitchen oxyCODONE-acetaminophen (PERCOCET/ROXICET) 5-325 MG per tablet Take 0.5 tablets by mouth daily as  needed for pain.     No current facility-administered medications on file prior to visit.    Allergies  Allergen Reactions  . Celecoxib     REACTION: Swelling  . Codeine   . Ibuprofen   . Meloxicam     REACTION: Swelling  . Nsaids   . Rofecoxib   . Sulfonamide Derivatives     Family History  Problem Relation Age of Onset  . Heart disease Mother   . Diabetes Mother   . Pancreatic cancer Paternal Uncle   . Colon cancer Paternal Uncle   . Stomach cancer Paternal Uncle   . Cancer Maternal Aunt     Stomach  . Esophageal cancer Maternal Aunt   . Esophageal cancer Maternal Uncle   . Rectal cancer Neg Hx     BP 122/84 mmHg  Pulse 82  Ht 5' 4.5" (1.638 m)  Wt 164 lb (74.39 kg)  BMI 27.73 kg/m2  SpO2 95%  Review of Systems Denies weight change, hoarseness, chest pain, cough, dysphagia, skin rash, easy bruising, depression, cold intolerance, numbness, and rhinorrhea.  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  She has slight doe and intermittent headache.      Objective:   Physical Exam VS: see vs page GEN: no distress HEAD: head: no deformity eyes: no periorbital swelling, no proptosis external nose and ears are normal mouth: no lesion seen NECK: supple, thyroid is not enlarged.  i cannot appreciate the nodule. CHEST WALL: no deformity LUNGS: clear to auscultation CV: reg rate and rhythm, no murmur ABD: abdomen is soft, nontender.  no hepatosplenomegaly.  not distended.  no hernia MUSCULOSKELETAL: muscle bulk and strength are grossly normal.  no obvious joint swelling.  gait is normal and steady EXTEMITIES: no deformity.  no ulcer on the feet.  feet are of normal color and temp.  no edema PULSES: dorsalis pedis intact bilat.  no carotid bruit NEURO:  cn 2-12 grossly intact.   readily moves all 4's.  sensation is intact to touch on the feet SKIN:  Normal texture and temperature.  No rash or suspicious lesion is visible.   NODES:  None palpable at the  neck PSYCH: Alert and well-oriented.  Does not appear anxious nor depressed.  Lab Results  Component Value Date   TSH 3.01 11/06/2015  (previous TSH was slightly high)  CT: Dense 1.2 cm nodule in the anterior left upper mediastinum, probably an exophytic inferior left thyroid lobe nodule  [place]: Solitary left lower pole nodule measures 1.6 x 0.9 x 1.8 cm. Findings meet consensus criteria for biopsy  I have reviewed outside records, and summarized: Pt was noted at PCP appt to have a palpable thyroid nodule.       Assessment & Plan:  Thyroid nodule, new, uncertain etiology.  Nonpalpable to me.   Mild hypothyroidism, resolved.  Despite resolution, she should take low-dose synthroid, in view of the nodule.   Patient is advised the following: Patient Instructions  Please have the biopsy done, by ultrasound.  you will receive a phone call, about a day and time for an appointment.   After you have this done, please start a thyroid hormone pill.  I have sent a prescription to your pharmacy.   Please come back for a follow-up appointment in 6-12 months.    Renato Shin, MD

## 2015-12-10 NOTE — Patient Instructions (Addendum)
Please have the biopsy done, by ultrasound.  you will receive a phone call, about a day and time for an appointment.   After you have this done, please start a thyroid hormone pill.  I have sent a prescription to your pharmacy.   Please come back for a follow-up appointment in 6-12 months.

## 2015-12-19 ENCOUNTER — Ambulatory Visit
Admission: RE | Admit: 2015-12-19 | Discharge: 2015-12-19 | Disposition: A | Discharge status: Home / Self Care | Payer: Medicare Other | Source: Ambulatory Visit | Attending: Endocrinology | Admitting: Endocrinology

## 2015-12-19 ENCOUNTER — Other Ambulatory Visit (HOSPITAL_COMMUNITY)
Admission: RE | Admit: 2015-12-19 | Discharge: 2015-12-19 | Disposition: A | Discharge status: Home / Self Care | Payer: Medicare Other | Source: Ambulatory Visit | Attending: General Surgery | Admitting: General Surgery

## 2015-12-19 DIAGNOSIS — E041 Nontoxic single thyroid nodule: Secondary | ICD-10-CM | POA: Diagnosis not present

## 2015-12-24 ENCOUNTER — Telehealth: Payer: Self-pay | Admitting: Endocrinology

## 2015-12-24 NOTE — Telephone Encounter (Signed)
Patient calling for lab results

## 2015-12-25 NOTE — Telephone Encounter (Signed)
Called patient. Left message for return call again.

## 2016-01-09 DIAGNOSIS — H9202 Otalgia, left ear: Secondary | ICD-10-CM | POA: Diagnosis not present

## 2016-01-09 DIAGNOSIS — Z6827 Body mass index (BMI) 27.0-27.9, adult: Secondary | ICD-10-CM | POA: Diagnosis not present

## 2016-01-09 DIAGNOSIS — M26609 Unspecified temporomandibular joint disorder, unspecified side: Secondary | ICD-10-CM | POA: Diagnosis not present

## 2016-02-06 ENCOUNTER — Other Ambulatory Visit: Payer: Medicare Other

## 2016-02-20 DIAGNOSIS — H2513 Age-related nuclear cataract, bilateral: Secondary | ICD-10-CM | POA: Diagnosis not present

## 2016-02-20 DIAGNOSIS — H52223 Regular astigmatism, bilateral: Secondary | ICD-10-CM | POA: Diagnosis not present

## 2016-02-20 DIAGNOSIS — H25043 Posterior subcapsular polar age-related cataract, bilateral: Secondary | ICD-10-CM | POA: Diagnosis not present

## 2016-02-20 DIAGNOSIS — H35372 Puckering of macula, left eye: Secondary | ICD-10-CM | POA: Diagnosis not present

## 2016-02-20 DIAGNOSIS — H5213 Myopia, bilateral: Secondary | ICD-10-CM | POA: Diagnosis not present

## 2016-02-20 DIAGNOSIS — H11153 Pinguecula, bilateral: Secondary | ICD-10-CM | POA: Diagnosis not present

## 2016-02-20 DIAGNOSIS — H35342 Macular cyst, hole, or pseudohole, left eye: Secondary | ICD-10-CM | POA: Diagnosis not present

## 2016-02-20 DIAGNOSIS — H18413 Arcus senilis, bilateral: Secondary | ICD-10-CM | POA: Diagnosis not present

## 2016-02-20 DIAGNOSIS — H11423 Conjunctival edema, bilateral: Secondary | ICD-10-CM | POA: Diagnosis not present

## 2016-02-20 DIAGNOSIS — H43811 Vitreous degeneration, right eye: Secondary | ICD-10-CM | POA: Diagnosis not present

## 2016-05-07 ENCOUNTER — Other Ambulatory Visit: Payer: Self-pay

## 2016-05-14 IMAGING — CR DG CHEST 2V
2 series · 2 of 2 positions shown · non-contrast
Comparison: March 08, 2013.

CLINICAL DATA: Shortness of breath.

EXAM:
CHEST  2 VIEW

[view not recorded (1 of 2)]
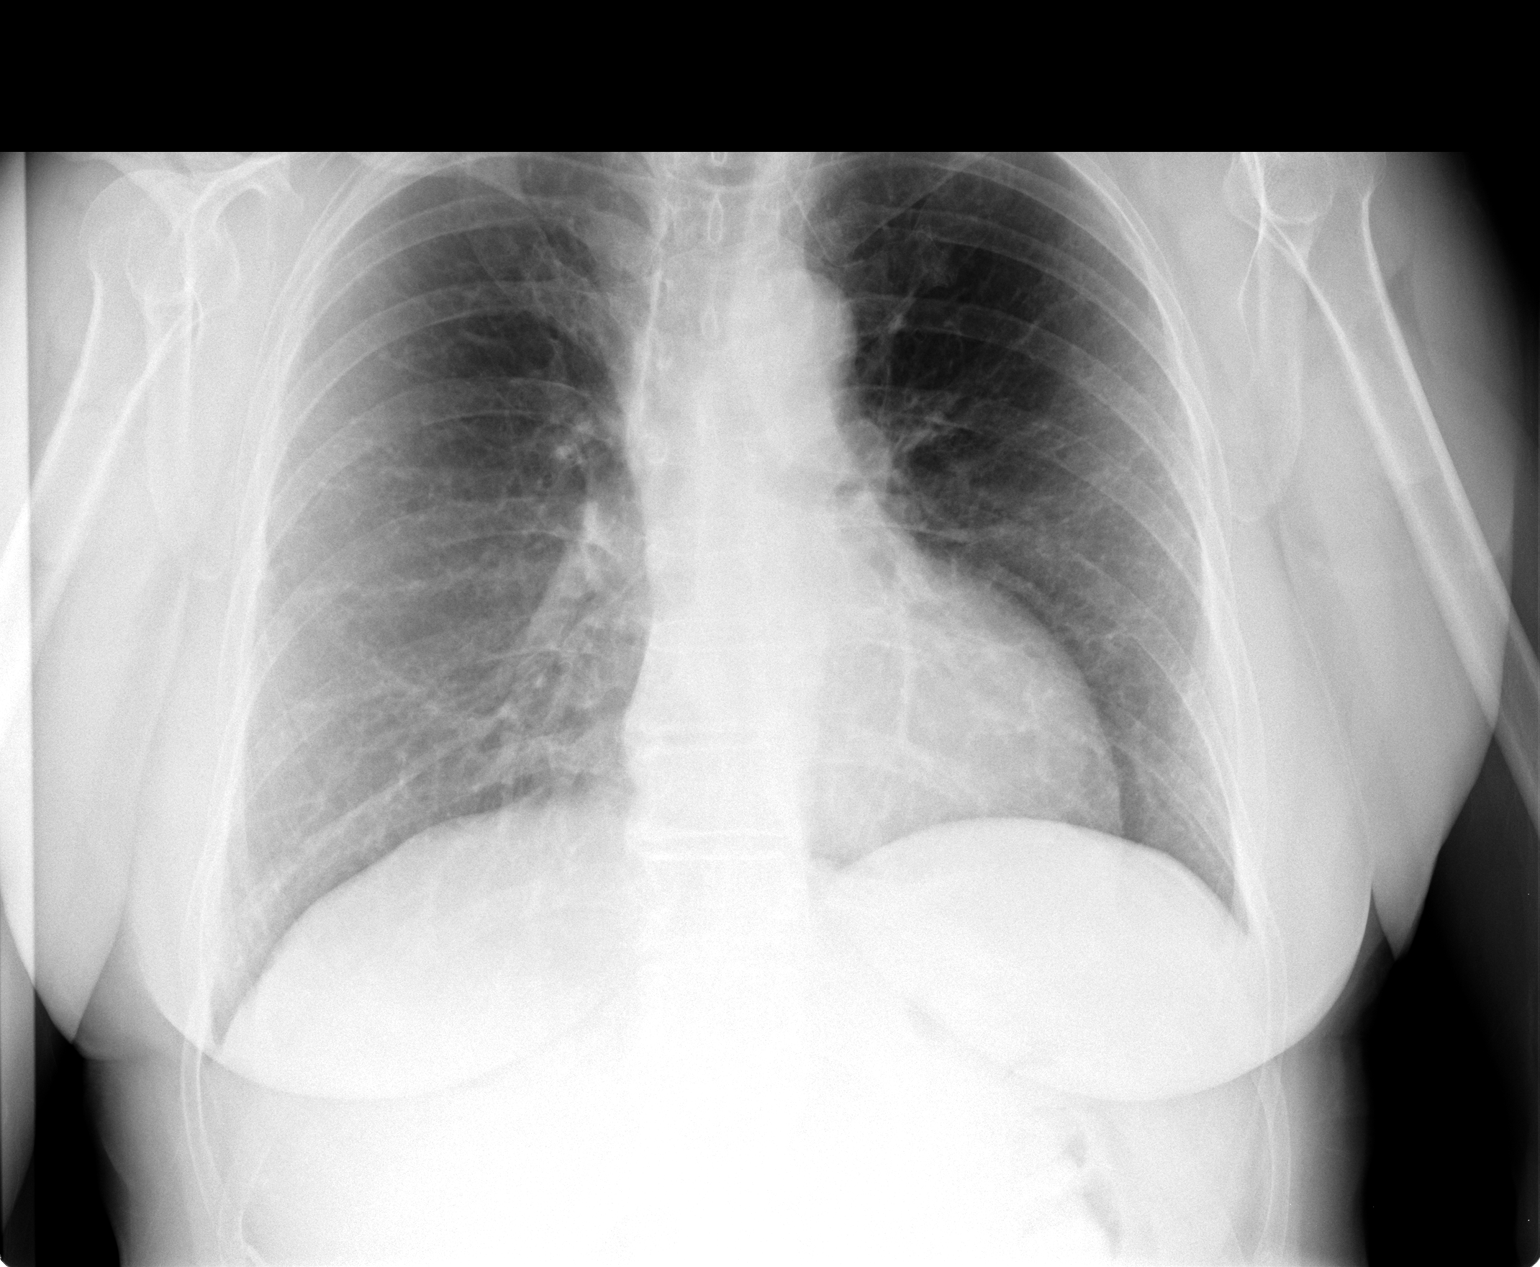

[view not recorded (2 of 2)]
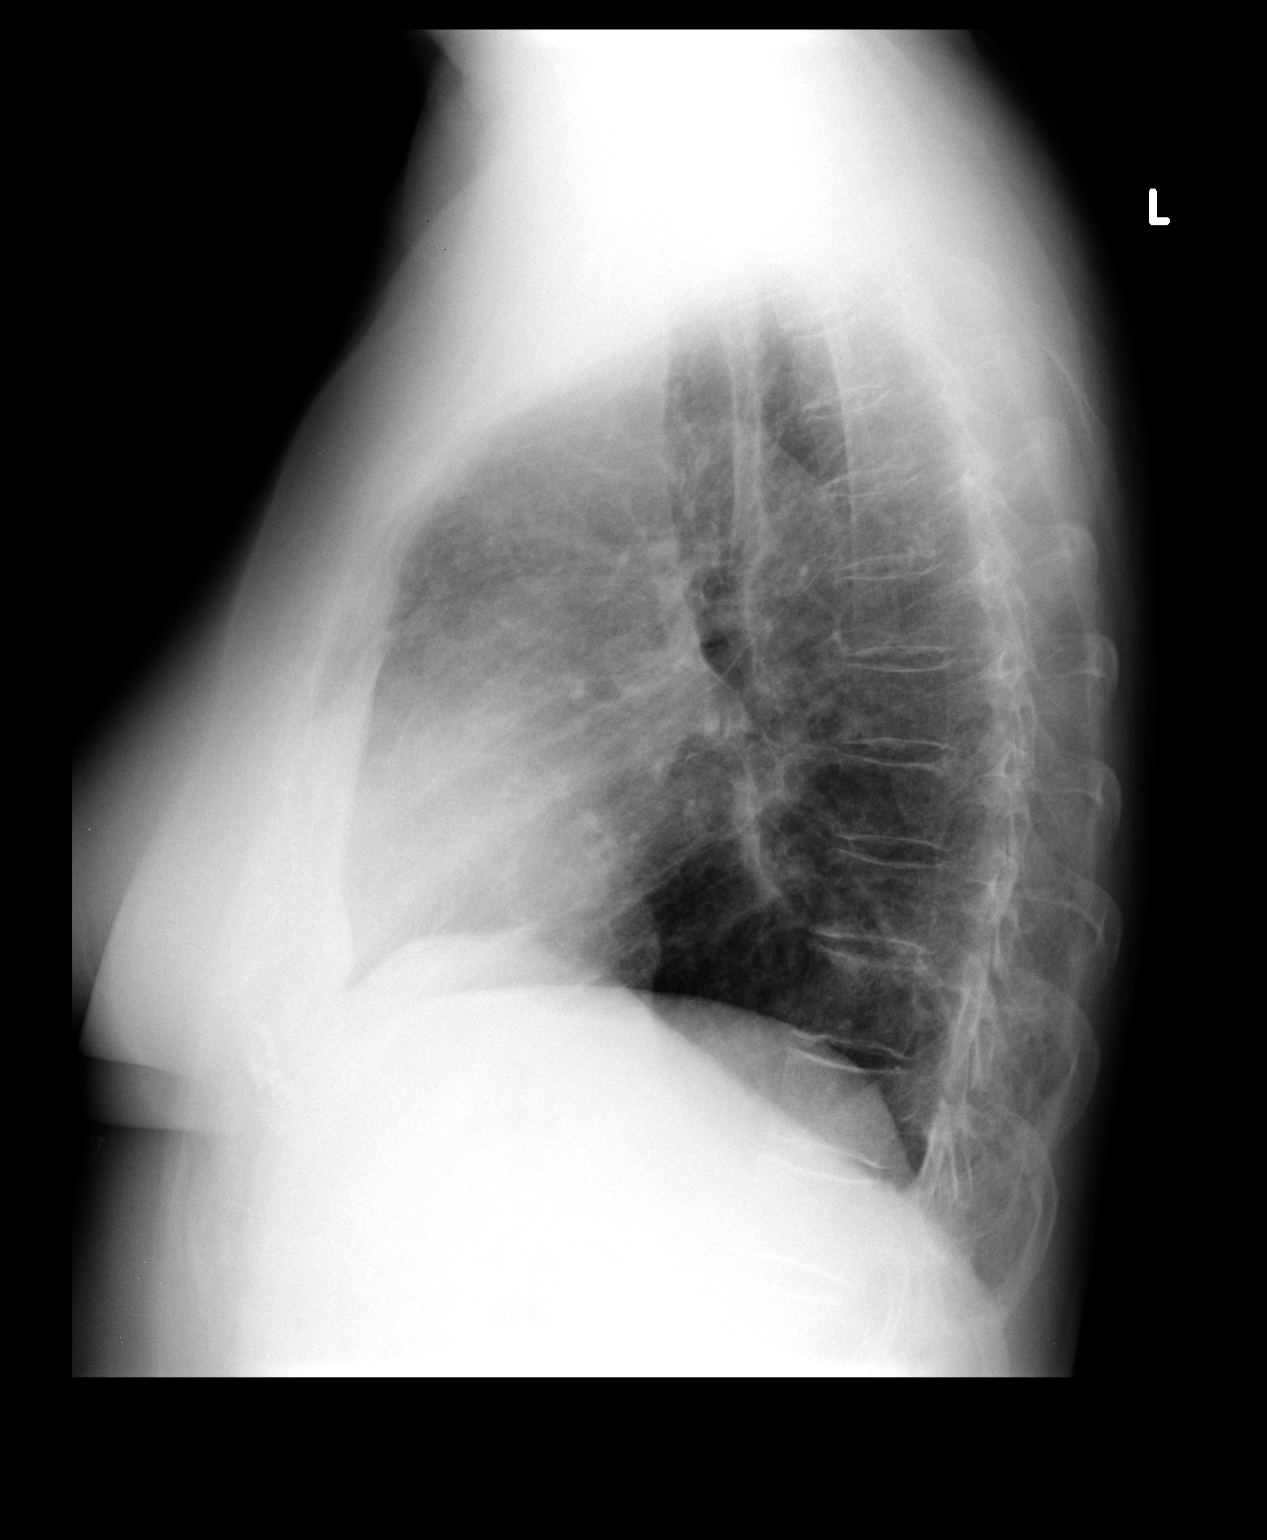

[2 of 2 positions shown; findings below may reference images not displayed]

FINDINGS: The heart size and mediastinal contours are within normal limits.
Stable interstitial densities are noted throughout both lungs most
consistent with scarring. No pneumothorax or pleural effusion is
noted. No acute pulmonary disease is noted. Old right clavicular
fracture is again noted.
IMPRESSION: No acute cardiopulmonary abnormality seen.

## 2016-06-07 NOTE — Progress Notes (Deleted)
   Subjective:    Patient ID: Heather Townsend, female    DOB: 1945/11/14, 71 y.o.   MRN: [id number]  HPI Pt returns for f/u of thyroid nodule (dx'ed 2017; US showed solitary left lower pole nodule: 1.6 x 0.9 x 1.8 cm; bx in 2017 showed BENIGN FOLLICULAR NODULE ([place] CATEGORY II); TSH has been normal).     Review of Systems Denies neck pain    Objective:   Physical Exam VS: see vs page GEN: no distress.  NECK: supple, thyroid is not enlarged.  I cannot appreciate the nodule.       Assessment & Plan:

## 2016-06-08 ENCOUNTER — Ambulatory Visit (INDEPENDENT_AMBULATORY_CARE_PROVIDER_SITE_OTHER): Payer: Medicare Other | Admitting: Endocrinology

## 2016-06-08 DIAGNOSIS — Z0289 Encounter for other administrative examinations: Secondary | ICD-10-CM

## 2016-06-17 ENCOUNTER — Ambulatory Visit: Payer: Medicare Other | Admitting: Pulmonary Disease

## 2016-06-23 ENCOUNTER — Ambulatory Visit (INDEPENDENT_AMBULATORY_CARE_PROVIDER_SITE_OTHER): Payer: Medicare Other

## 2016-06-23 VITALS — BP 136/80 | HR 77 | Ht 64.0 in | Wt 159.2 lb

## 2016-06-23 DIAGNOSIS — Z0000 Encounter for general adult medical examination without abnormal findings: Secondary | ICD-10-CM

## 2016-06-23 DIAGNOSIS — E119 Type 2 diabetes mellitus without complications: Secondary | ICD-10-CM | POA: Diagnosis not present

## 2016-06-23 NOTE — Progress Notes (Signed)
I have reviewed and agree with note, evaluation, plan. Agree needs follow up for a1c and thyroid checks. Glad patient was encouraged to schedule follow up with me  Garret Reddish, MD

## 2016-06-23 NOTE — Progress Notes (Signed)
Subjective:   Heather Townsend is a 71 y.o. female who presents for Medicare Annual (Subsequent) preventive examination.  The Patient was informed that the wellness visit is to identify future health risk and educate and initiate measures that can reduce risk for increased disease through the lifespan.    NO ROS; Medicare Wellness Visit Hx hyperlipidemia; Hepatitis chronic (fatty liver) NASH Goes to [place]; NASH [name] Plan  U/s of liver; found pulmonary nodule; scan found something in body of lungs; full lung scan; found thyroid nodule; is not taking thyroid med as she knows it close to normal; will discuss with Dr. Yong Channel at her next apt.   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  Family hx + HD and pancreatic cancer; colon cancer  Lives alone Single family home 2 children and one grand child 19    The following written screening schedule of preventive measures were reviewed with assessment and plan made per below and patient instructions:  Smoking history reviewed / former smoker with 11.25 pack years  Use of Smokeless tobacco' no  Second Hand Smoke status; No Smokers in the home no Dental fup - regular dental work  ETOH rare   RISK FACTORS Regular exercise-  Dance 2 step; line dancing; does this 2 times a week  Housekeeping Has a rider at home  Cleans car  Office is upstairs as well; 13 steps; goes up and down frequently; average 8 to 10 times a day    Diet;   incorporates fruits and vegetables;  Skipping a meal and drinking meal replacement  Tried to gain weight when she was young  Lipids reviewed and reducing cholesterol discussed  HDL 41; trig 93; LDL 122 Glucose; 7.1 in 12/ 2016  Educated regarding pre diabetes       Fall risk: presented mobile; get up and go WNL no The last time she fell; because she is aggressive at her activity  [address] off porch cleaning and misjudged a step  Mobility of Functional changes this year? no Safety at home and  Community reviewed;     Depression Screen PhQ 2: negative  Eye; macular hole in left eye Noted after eye dilatation exam   Activities of Daily Living - See functional screen   Cognitive testing;no States she just managed a budget for her non profit  Ad8 score; 0 or less than 2  MMSE deferred or completed if AD8 + 2 issues  Advanced Directives reviewed for completion; discussion with MD as well as supportive resources as needed  Patient Care Team: Marin Olp, MD as PCP - General (Family Medicine)   Preventives screens reviewed Labs; declines metformin; recommended weight loss and exercise for A1c will repeat when she sees Dr.  Abelina Bachelor md Neurologist  Liver doctor in Feb Pulmonology  Does not know eye doctor but has regular exams  Colonoscopy 04/2013 repeat in 5 years 04/2018 Mammogram; Jan 2016; Due jan 2018 will have one this year  Dexa- Dr. Yong Channel stated to repeat 2018 or 2019 Discussed osteopenia; calcium and Vit d recommendations Ophthalmology exam; completed but do not have information States she does not have any eye disease;  Pap not recently  GYN doctor  Dr. Kris Mouton  in [facility]; has not been recently    Immunization History  Administered Date(s) Administered  . Td 07/31/2006   Required Immunizations needed today  Screening test up to date or reviewed for plan of completion Health Maintenance Due  Topic Date Due  . OPHTHALMOLOGY EXAM  07/29/1955  . HEMOGLOBIN A1C  10/31/2015  . FOOT EXAM  05/01/2016  . URINE MICROALBUMIN  05/01/2016   Agreed for Ua Microalbumin today  Defer A1c until apt with dr. Yong Channel  Foot exam completed  Eye exam completed but cannot confirm        Objective:     Vitals: BP 136/80    Pulse 77   Ht 5\' 4"  (1.626 m)   Wt 159 lb 3 oz (72.2 kg)   SpO2 96%   BMI 27.32 kg/m   Body mass index is 27.32 kg/m.   Tobacco History  Smoking Status  . Former Smoker  . Packs/day: 0.75  . Years: 15.00  . Types: Cigarettes  . Quit date: 06/01/1986  Smokeless Tobacco  . Never Used     Counseling given: Yes   Past Medical History:  Diagnosis Date  . Allergy   . Diverticulosis   . DIVERTICULOSIS, COLON 01/03/2007  . Fatty infiltration of liver   . GERD (gastroesophageal reflux disease)   . Hepatitis, chronic active ([place])    NASH  . Hiatal hernia   . HIATAL HERNIA 12/07/2006  . Hyperlipidemia   . Low back pain    Past Surgical History:  Procedure Laterality Date  . APPENDECTOMY    . EYE SURGERY     macular hole  . ganglion cysts     left hand  . HAND SURGERY  1996  . SHOULDER SURGERY  2006   rt shoulder  . TONSILLECTOMY    . TUBAL LIGATION    . UTERINE SUSPENSION     Family History  Problem Relation Age of Onset  . Heart disease Mother   . Diabetes Mother   . Pancreatic cancer Paternal Uncle   . Colon cancer Paternal Uncle   . Stomach cancer Paternal Uncle   . Cancer Maternal Aunt     Stomach  . Esophageal cancer Maternal Aunt   . Esophageal cancer Maternal Uncle   . Rectal cancer Neg Hx    History  Sexual Activity  . Sexual activity: Not Currently    Outpatient Encounter Prescriptions as of 06/23/2016  Medication Sig  . aspirin 81 MG tablet Take 81 mg by mouth as needed.   . Omega-3 Fatty Acids (FISH OIL) 1000 MG CAPS Take by mouth.  . oxyCODONE-acetaminophen (PERCOCET/ROXICET) 5-325 MG per tablet Take 0.5 tablets by mouth daily as needed for pain.  . potassium chloride (KLOR-CON) 20 MEQ packet Take by mouth 2 (two) times daily.  . Zinc Sulfate (ZINC 15 PO) Take by mouth.  . levothyroxine (SYNTHROID, LEVOTHROID) 25 MCG tablet Take 1 tablet (25 mcg total) by mouth daily before breakfast. (Patient not taking: Reported on 06/23/2016)   No  facility-administered encounter medications on file as of 06/23/2016.     Activities of Daily Living No flowsheet data found.  Patient Care Team: Marin Olp, MD as PCP - General (Family Medicine)    Assessment:     Exercise Activities and Dietary recommendations  will continue working and dancing for exercise Discussed losing 10 pounds; stated she would like too but did not commit   Goals    None     Fall Risk Fall  Risk  06/23/2016 05/07/2016 05/02/2015 09/04/2013  Falls in the past year? No No No No   Depression Screen PHQ 2/9 Scores 06/23/2016 05/02/2015 09/04/2013  PHQ - 2 Score 0 0 0     Cognitive Function WNL        Immunization History  Administered Date(s) Administered  . Td 07/31/2006   Screening Tests Health Maintenance  Topic Date Due  . OPHTHALMOLOGY EXAM  07/29/1955  . HEMOGLOBIN A1C  10/31/2015  . FOOT EXAM  05/01/2016  . URINE MICROALBUMIN  05/01/2016  . INFLUENZA VACCINE  03/07/2034 (Originally 12/31/2015)  . ZOSTAVAX  03/07/2034 (Originally 07/30/2007)  . PNA vac Low Risk Adult (1 of 2 - PCV13) 08/09/2034 (Originally 07/29/2012)  . MAMMOGRAM  06/28/2016  . TETANUS/TDAP  07/30/2016  . COLONOSCOPY  04/11/2018  . DEXA SCAN  Completed  . Hepatitis C Screening  Completed      Plan:    PCP Notes  Health Maintenance Foot exam completed today UA Microalbumin obtained today Will have A1c drawn at the next OV  To schedule eye exam but does have regular eye checks  could not remember the doctor for confirmation   Due mammogram and will get one this year   Abnormal Screens  Educated regarding dexa; calcium intake and vit d; weight bearing exercise Repeat 2 to 3 years   Educated on pre-diabetes as she declined metformin Given information on prescreening for DM   Referrals   Patient concerns; not taking thyroid medicine; does not feel she needs it Will discuss with Dr. Yong Channel and suggested she make apt for fup A1c and Thyroid; as well as annual  blood draw.   Nurse Concerns; educate on diabetes;  Declines vaccinations;   Next PCP apt: [facility]    During the course of the visit the patient was educated and counseled about the following appropriate screening and preventive services:   Vaccines to include Pneumoccal, Influenza, Hepatitis B, Td, Zostavax, HCV  Electrocardiogram  Cardiovascular Disease  Colorectal cancer screening  Bone density screening  Diabetes screening  Glaucoma screening  Mammography/PAP  Nutrition counseling   Patient Instructions (the written plan) was given to the patient.   Wynetta Fines, RN  06/23/2016

## 2016-06-23 NOTE — Patient Instructions (Addendum)
Heather Townsend , Thank you for taking time to come for your Medicare Wellness Visit. I appreciate your ongoing commitment to your health goals. Please review the following plan we discussed and let me know if I can assist you in the future.   Will schedule eye exam this year  Will mammogram this year (due Jan)   Will get 1282m of calcium and Vit d 1000u  Weight bearing exercise  Can go to [email or web address]   Will make fup apt with Dr. HYong Channel These are the goals we discussed: Goals    None      This is a list of the screening recommended for you and due dates:  Health Maintenance  Topic Date Due  . Eye exam for diabetics  07/29/1955  . Hemoglobin A1C  10/31/2015  . Complete foot exam   05/01/2016  . Urine Protein Check  05/01/2016  . Flu Shot  03/07/2034*  . Shingles Vaccine  03/07/2034*  . Pneumonia vaccines (1 of 2 - PCV13) 08/09/2034*  . Mammogram  06/28/2016  . Tetanus Vaccine  07/30/2016  . Colon Cancer Screening  04/11/2018  . DEXA scan (bone density measurement)  Completed  .  Hepatitis C: One time screening is recommended by Center for Disease Control  (CDC) for  adults born from [birth year]through 1965.   Completed  *Topic was postponed. The date shown is not the original due date.       Immunization History  Administered Date(s) Administered  . Td 07/31/2006   Required Immunizations needed today  Screening test up to date or reviewed for plan of completion Health Maintenance Due  Topic Date Due  . OPHTHALMOLOGY EXAM  07/29/1957  . HEMOGLOBIN A1C  10/31/2015  . FOOT EXAM  05/01/2016  . URINE MICROALBUMIN  05/01/2016       Screening for Type 2 Diabetes A screening test for type 2 diabetes (type 2 diabetes mellitus) is a blood test to measure your blood sugar (glucose) level. This test is done to check for early signs of diabetes, before you develop symptoms. Type 2 diabetes is a long-term (chronic) disease that occurs when the pancreas does not make  enough of a hormone called insulin. This results in high blood glucose levels, which can cause many complications. You may be screened for type 2 diabetes as part of your regular health care, especially if you have a high risk for diabetes. Screening can help identify type 2 diabetes at its early stage (prediabetes). Identifying and treating prediabetes may delay or prevent development of type 2 diabetes. What are the risk factors for type 2 diabetes? The following factors may make you more likely to develop type 2 diabetes:  Having a parent or sibling (first-degree relative) who has diabetes.  Being overweight or obese.  Being of [place], [place], Latino, Asian, or African-American descent.  Not getting enough exercise.  Being older than 417  Having a history of diabetes during pregnancy (gestational diabetes).  Having low levels of good cholesterol (HDL-C) or high levels of blood fats (triglycerides).  Having high blood glucose in a previous blood test.  Having high blood pressure.  Having certain diseases or conditions, including:  Acanthosis nigricans. This is a condition that causes dark skin on the neck, armpits, and groin.  Polycystic ovary syndrome (PCOS).  Heart disease.  Having delivered a baby who weighed more than 9 lb (4.1 kg). Who should be screened for type 2 diabetes? Adults  Adults age 3693and  older. These adults should be screened at least once every three years.  Adults who are younger than 71, overweight, and have at least one other risk factor. These adults should be screened at least once every three years.  Adults who have normal blood glucose levels and two or more risk factors. These adults may be screened once every year (annually).  Women who have had gestational diabetes in the past. These women should be screened at least once every three years.  Pregnant women who have risk factors. These women should be screened at  their first prenatal visit.  Pregnant women with no risk factors. These women should be screened between weeks 24 and 28 of pregnancy. Children and adolescents  Children and adolescents should be screened for type 2 diabetes if they are overweight and have 2 of the following risk factors:  A family history of type 2 diabetes.  Being a member of a high risk race or ethnic group.  Signs of insulin resistance or conditions associated with insulin resistance.  A mother who had gestational diabetes while pregnant with him or her.  Screening should be done at least once every three years, starting at age 72. Your health care provider or your child's health care provider may recommend having a screening more or less often. What happens during screening? During screening, your health care provider may ask questions about:  Your health and your risk factors, including your activity level and any medical conditions that you have.  The health of your first-degree relatives.  Past pregnancies, if this applies. Your health care provider will also do a physical exam, including a blood pressure measurement and blood tests. There are four blood tests that can be used to screen for type 2 diabetes. You may have one or more of the following:  A fasting plasma glucose test (FBG). You will not be allowed to eat for at least eight hours before a blood sample is taken.  A random blood glucose test. This test checks your blood glucose at any time of the day regardless of when you ate.  An oral glucose tolerance test (OGTT). This test measures your blood glucose at two times:  After you have not eaten (have fasted) overnight.  Two hours after you drink a glucose-containing beverage. A diagnosis can be made if the level is greater than 200 mg/dL (11.1 mmol/L).  An A1c test. This test provides information about blood glucose control over the previous three months. What do the results mean? Your test  results are a measurement of how much glucose is in your blood. Normal blood glucose levels mean that you do not have diabetes or prediabetes. High blood glucose levels may mean that you have prediabetes or diabetes. Depending on the results, other tests may be needed to confirm the diagnosis. This information is not intended to replace advice given to you by your health care provider. Make sure you discuss any questions you have with your health care provider. Document Released: 03/14/2009 Document Revised: 10/24/2015 Document Reviewed: 03/15/2015 Elsevier Interactive Patient Education  2017 [place] Prevention in the Home Introduction Falls can cause injuries. They can happen to people of all ages. There are many things you can do to make your home safe and to help prevent falls. What can I do on the outside of my home?  Regularly fix the edges of walkways and driveways and fix any cracks.  Remove anything that might make you trip as you walk  through a door, such as a raised step or threshold.  Trim any bushes or trees on the path to your home.  Use bright outdoor lighting.  Clear any walking paths of anything that might make someone trip, such as rocks or tools.  Regularly check to see if handrails are loose or broken. Make sure that both sides of any steps have handrails.  Any raised decks and porches should have guardrails on the edges.  Have any leaves, snow, or ice cleared regularly.  Use sand or salt on walking paths during winter.  Clean up any spills in your garage right away. This includes oil or grease spills. What can I do in the bathroom?  Use night lights.  Install grab bars by the toilet and in the tub and shower. Do not use towel bars as grab bars.  Use non-skid mats or decals in the tub or shower.  If you need to sit down in the shower, use a plastic, non-slip stool.  Keep the floor dry. Clean up any water that spills on the floor as soon as it  happens.  Remove soap buildup in the tub or shower regularly.  Attach bath mats securely with double-sided non-slip rug tape.  Do not have throw rugs and other things on the floor that can make you trip. What can I do in the bedroom?  Use night lights.  Make sure that you have a light by your bed that is easy to reach.  Do not use any sheets or blankets that are too big for your bed. They should not hang down onto the floor.  Have a firm chair that has side arms. You can use this for support while you get dressed.  Do not have throw rugs and other things on the floor that can make you trip. What can I do in the kitchen?  Clean up any spills right away.  Avoid walking on wet floors.  Keep items that you use a lot in easy-to-reach places.  If you need to reach something above you, use a strong step stool that has a grab bar.  Keep electrical cords out of the way.  Do not use floor polish or wax that makes floors slippery. If you must use wax, use non-skid floor wax.  Do not have throw rugs and other things on the floor that can make you trip. What can I do with my stairs?  Do not leave any items on the stairs.  Make sure that there are handrails on both sides of the stairs and use them. Fix handrails that are broken or loose. Make sure that handrails are as long as the stairways.  Check any carpeting to make sure that it is firmly attached to the stairs. Fix any carpet that is loose or worn.  Avoid having throw rugs at the top or bottom of the stairs. If you do have throw rugs, attach them to the floor with carpet tape.  Make sure that you have a light switch at the top of the stairs and the bottom of the stairs. If you do not have them, ask someone to add them for you. What else can I do to help prevent falls?  Wear shoes that:  Do not have high heels.  Have rubber bottoms.  Are comfortable and fit you well.  Are closed at the toe. Do not wear sandals.  If you  use a stepladder:  Make sure that it is fully opened. Do not climb  a closed stepladder.  Make sure that both sides of the stepladder are locked into place.  Ask someone to hold it for you, if possible.  Clearly mark and make sure that you can see:  Any grab bars or handrails.  First and last steps.  Where the edge of each step is.  Use tools that help you move around (mobility aids) if they are needed. These include:  Canes.  Walkers.  Scooters.  Crutches.  Turn on the lights when you go into a dark area. Replace any light bulbs as soon as they burn out.  Set up your furniture so you have a clear path. Avoid moving your furniture around.  If any of your floors are uneven, fix them.  If there are any pets around you, be aware of where they are.  Review your medicines with your doctor. Some medicines can make you feel dizzy. This can increase your chance of falling. Ask your doctor what other things that you can do to help prevent falls. This information is not intended to replace advice given to you by your health care provider. Make sure you discuss any questions you have with your health care provider. Document Released: 03/14/2009 Document Revised: 10/24/2015 Document Reviewed: 06/22/2014  2017 Elsevier  Health Maintenance, Female Introduction Adopting a healthy lifestyle and getting preventive care can go a long way to promote health and wellness. Talk with your health care provider about what schedule of regular examinations is right for you. This is a good chance for you to check in with your provider about disease prevention and staying healthy. In between checkups, there are plenty of things you can do on your own. Experts have done a lot of research about which lifestyle changes and preventive measures are most likely to keep you healthy. Ask your health care provider for more information. Weight and diet Eat a healthy diet  Be sure to include plenty of  vegetables, fruits, low-fat dairy products, and lean protein.  Do not eat a lot of foods high in solid fats, added sugars, or salt.  Get regular exercise. This is one of the most important things you can do for your health.  Most adults should exercise for at least 150 minutes each week. The exercise should increase your heart rate and make you sweat (moderate-intensity exercise).  Most adults should also do strengthening exercises at least twice a week. This is in addition to the moderate-intensity exercise. Maintain a healthy weight  Body mass index (BMI) is a measurement that can be used to identify possible weight problems. It estimates body fat based on height and weight. Your health care provider can help determine your BMI and help you achieve or maintain a healthy weight.  For females 19 years of age and older:  A BMI below 18.5 is considered underweight.  A BMI of 18.5 to 24.9 is normal.  A BMI of 25 to 29.9 is considered overweight.  A BMI of 30 and above is considered obese. Watch levels of cholesterol and blood lipids  You should start having your blood tested for lipids and cholesterol at 69 years of age, then have this test every 5 years.  You may need to have your cholesterol levels checked more often if:  Your lipid or cholesterol levels are high.  You are older than 69 years of age.  You are at high risk for heart disease. Cancer screening Lung Cancer  Lung cancer screening is recommended for adults 21-71 years old  who are at high risk for lung cancer because of a history of smoking.  A yearly low-dose CT scan of the lungs is recommended for people who:  Currently smoke.  Have quit within the past 15 years.  Have at least a 30-pack-year history of smoking. A pack year is smoking an average of one pack of cigarettes a day for 1 year.  Yearly screening should continue until it has been 15 years since you quit.  Yearly screening should stop if you develop  a health problem that would prevent you from having lung cancer treatment. Breast Cancer  Practice breast self-awareness. This means understanding how your breasts normally appear and feel.  It also means doing regular breast self-exams. Let your health care provider know about any changes, no matter how small.  If you are in your 20s or 30s, you should have a clinical breast exam (CBE) by a health care provider every 1-3 years as part of a regular health exam.  If you are 67 or older, have a CBE every year. Also consider having a breast X-ray (mammogram) every year.  If you have a family history of breast cancer, talk to your health care provider about genetic screening.  If you are at high risk for breast cancer, talk to your health care provider about having an MRI and a mammogram every year.  Breast cancer gene (BRCA) assessment is recommended for women who have family members with BRCA-related cancers. BRCA-related cancers include:  Breast.  Ovarian.  Tubal.  Peritoneal cancers.  Results of the assessment will determine the need for genetic counseling and BRCA1 and BRCA2 testing. Cervical Cancer  Your health care provider may recommend that you be screened regularly for cancer of the pelvic organs (ovaries, uterus, and vagina). This screening involves a pelvic examination, including checking for microscopic changes to the surface of your cervix (Pap test). You may be encouraged to have this screening done every 3 years, beginning at age 40.  For women ages 9-65, health care providers may recommend pelvic exams and Pap testing every 3 years, or they may recommend the Pap and pelvic exam, combined with testing for human papilloma virus (HPV), every 5 years. Some types of HPV increase your risk of cervical cancer. Testing for HPV may also be done on women of any age with unclear Pap test results.  Other health care providers may not recommend any screening for nonpregnant women who  are considered low risk for pelvic cancer and who do not have symptoms. Ask your health care provider if a screening pelvic exam is right for you.  If you have had past treatment for cervical cancer or a condition that could lead to cancer, you need Pap tests and screening for cancer for at least 20 years after your treatment. If Pap tests have been discontinued, your risk factors (such as having a new sexual partner) need to be reassessed to determine if screening should resume. Some women have medical problems that increase the chance of getting cervical cancer. In these cases, your health care provider may recommend more frequent screening and Pap tests. Colorectal Cancer  This type of cancer can be detected and often prevented.  Routine colorectal cancer screening usually begins at 69 years of age and continues through 69 years of age.  Your health care provider may recommend screening at an earlier age if you have risk factors for colon cancer.  Your health care provider may also recommend using home test kits to check  for hidden blood in the stool.  A small camera at the end of a tube can be used to examine your colon directly (sigmoidoscopy or colonoscopy). This is done to check for the earliest forms of colorectal cancer.  Routine screening usually begins at age 40.  Direct examination of the colon should be repeated every 5-10 years through 69 years of age. However, you may need to be screened more often if early forms of precancerous polyps or small growths are found. Skin Cancer  Check your skin from head to toe regularly.  Tell your health care provider about any new moles or changes in moles, especially if there is a change in a mole's shape or color.  Also tell your health care provider if you have a mole that is larger than the size of a pencil eraser.  Always use sunscreen. Apply sunscreen liberally and repeatedly throughout the day.  Protect yourself by wearing long  sleeves, pants, a wide-brimmed hat, and sunglasses whenever you are outside. Heart disease, diabetes, and high blood pressure  High blood pressure causes heart disease and increases the risk of stroke. High blood pressure is more likely to develop in:  People who have blood pressure in the high end of the normal range (130-139/85-89 mm Hg).  People who are overweight or obese.  People who are African American.  If you are 39-62 years of age, have your blood pressure checked every 3-5 years. If you are 77 years of age or older, have your blood pressure checked every year. You should have your blood pressure measured twice-once when you are at a hospital or clinic, and once when you are not at a hospital or clinic. Record the average of the two measurements. To check your blood pressure when you are not at a hospital or clinic, you can use:  An automated blood pressure machine at a pharmacy.  A home blood pressure monitor.  If you are between 60 years and 63 years old, ask your health care provider if you should take aspirin to prevent strokes.  Have regular diabetes screenings. This involves taking a blood sample to check your fasting blood sugar level.  If you are at a normal weight and have a low risk for diabetes, have this test once every three years after 70 years of age.  If you are overweight and have a high risk for diabetes, consider being tested at a younger age or more often. Preventing infection Hepatitis B  If you have a higher risk for hepatitis B, you should be screened for this virus. You are considered at high risk for hepatitis B if:  You were born in a country where hepatitis B is common. Ask your health care provider which countries are considered high risk.  Your parents were born in a high-risk country, and you have not been immunized against hepatitis B (hepatitis B vaccine).  You have HIV or AIDS.  You use needles to inject street drugs.  You live with  someone who has hepatitis B.  You have had sex with someone who has hepatitis B.  You get hemodialysis treatment.  You take certain medicines for conditions, including cancer, organ transplantation, and autoimmune conditions. Hepatitis C  Blood testing is recommended for:  Everyone born from [birth year] through 1963.  Anyone with known risk factors for hepatitis C. Sexually transmitted infections (STIs)  You should be screened for sexually transmitted infections (STIs) including gonorrhea and chlamydia if:  You are sexually active and are  younger than 69 years of age.  You are older than 69 years of age and your health care provider tells you that you are at risk for this type of infection.  Your sexual activity has changed since you were last screened and you are at an increased risk for chlamydia or gonorrhea. Ask your health care provider if you are at risk.  If you do not have HIV, but are at risk, it may be recommended that you take a prescription medicine daily to prevent HIV infection. This is called pre-exposure prophylaxis (PrEP). You are considered at risk if:  You are sexually active and do not regularly use condoms or know the HIV status of your partner(s).  You take drugs by injection.  You are sexually active with a partner who has HIV. Talk with your health care provider about whether you are at high risk of being infected with HIV. If you choose to begin PrEP, you should first be tested for HIV. You should then be tested every 3 months for as long as you are taking PrEP. Pregnancy  If you are premenopausal and you may become pregnant, ask your health care provider about preconception counseling.  If you may become pregnant, take 400 to 800 micrograms (mcg) of folic acid every day.  If you want to prevent pregnancy, talk to your health care provider about birth control (contraception). Osteoporosis and menopause  Osteoporosis is a disease in which the bones lose  minerals and strength with aging. This can result in serious bone fractures. Your risk for osteoporosis can be identified using a bone density scan.  If you are 33 years of age or older, or if you are at risk for osteoporosis and fractures, ask your health care provider if you should be screened.  Ask your health care provider whether you should take a calcium or vitamin D supplement to lower your risk for osteoporosis.  Menopause may have certain physical symptoms and risks.  Hormone replacement therapy may reduce some of these symptoms and risks. Talk to your health care provider about whether hormone replacement therapy is right for you. Follow these instructions at home:  Schedule regular health, dental, and eye exams.  Stay current with your immunizations.  Do not use any tobacco products including cigarettes, chewing tobacco, or electronic cigarettes.  If you are pregnant, do not drink alcohol.  If you are breastfeeding, limit how much and how often you drink alcohol.  Limit alcohol intake to no more than 1 drink per day for nonpregnant women. One drink equals 12 ounces of beer, 5 ounces of wine, or 1 ounces of hard liquor.  Do not use street drugs.  Do not share needles.  Ask your health care provider for help if you need support or information about quitting drugs.  Tell your health care provider if you often feel depressed.  Tell your health care provider if you have ever been abused or do not feel safe at home. This information is not intended to replace advice given to you by your health care provider. Make sure you discuss any questions you have with your health care provider. Document Released: 12/01/2010 Document Revised: 10/24/2015 Document Reviewed: 02/19/2015  2017 Elsevier

## 2016-06-23 NOTE — Addendum Note (Signed)
Addended by: Tomi Likens on: 06/23/2016 05:35 PM   Modules accepted: Orders

## 2016-06-24 DIAGNOSIS — R413 Other amnesia: Secondary | ICD-10-CM | POA: Diagnosis not present

## 2016-06-24 DIAGNOSIS — R252 Cramp and spasm: Secondary | ICD-10-CM | POA: Diagnosis not present

## 2016-06-24 DIAGNOSIS — M5412 Radiculopathy, cervical region: Secondary | ICD-10-CM | POA: Diagnosis not present

## 2016-06-24 DIAGNOSIS — M5416 Radiculopathy, lumbar region: Secondary | ICD-10-CM | POA: Diagnosis not present

## 2016-06-24 DIAGNOSIS — E531 Pyridoxine deficiency: Secondary | ICD-10-CM | POA: Diagnosis not present

## 2016-06-24 DIAGNOSIS — G3184 Mild cognitive impairment, so stated: Secondary | ICD-10-CM | POA: Diagnosis not present

## 2016-06-24 LAB — MICROALBUMIN / CREATININE URINE RATIO
Creatinine,U: 244.1 mg/dL
MICROALB/CREAT RATIO: 0.7 mg/g (ref 0.0–30.0)
Microalb, Ur: 1.7 mg/dL (ref 0.0–1.9)

## 2016-07-02 DIAGNOSIS — K7581 Nonalcoholic steatohepatitis (NASH): Secondary | ICD-10-CM | POA: Diagnosis not present

## 2016-07-06 DIAGNOSIS — K7581 Nonalcoholic steatohepatitis (NASH): Secondary | ICD-10-CM | POA: Diagnosis not present

## 2016-07-07 ENCOUNTER — Other Ambulatory Visit: Payer: Self-pay | Admitting: Nurse Practitioner

## 2016-07-07 DIAGNOSIS — K7581 Nonalcoholic steatohepatitis (NASH): Secondary | ICD-10-CM

## 2016-07-15 ENCOUNTER — Ambulatory Visit
Admission: RE | Admit: 2016-07-15 | Discharge: 2016-07-15 | Disposition: A | Discharge status: Home / Self Care | Payer: Medicare Other | Source: Ambulatory Visit | Attending: Nurse Practitioner | Admitting: Nurse Practitioner

## 2016-07-15 DIAGNOSIS — K7581 Nonalcoholic steatohepatitis (NASH): Secondary | ICD-10-CM

## 2016-07-29 ENCOUNTER — Ambulatory Visit (INDEPENDENT_AMBULATORY_CARE_PROVIDER_SITE_OTHER): Payer: Medicare Other | Admitting: Pulmonary Disease

## 2016-07-29 ENCOUNTER — Encounter: Payer: Self-pay | Admitting: Pulmonary Disease

## 2016-07-29 VITALS — BP 124/70 | HR 78 | Ht 64.5 in | Wt 159.8 lb

## 2016-07-29 DIAGNOSIS — R0602 Shortness of breath: Secondary | ICD-10-CM | POA: Diagnosis not present

## 2016-07-29 DIAGNOSIS — J8410 Pulmonary fibrosis, unspecified: Secondary | ICD-10-CM | POA: Diagnosis not present

## 2016-07-29 NOTE — Patient Instructions (Signed)
We will schedule a high-resolution CT of the chest in 2 months time. Follow up in clinic after CT scan for review

## 2016-07-29 NOTE — Progress Notes (Addendum)
ARIELY RIDDELL    [id number]    1946-04-07  Primary Care Physician:Stephen Yong Channel, MD  Referring Physician: Marin Olp, MD [place], [place] [address] [phone] Chief complaint:   Follow up for lung fibrosis.  HPI: [name] is a 71 year old with history of Karlene Lineman cirrhosis, GERD, hiatal hernia. She had an MRI of the abdomen as a part of follow-up for cirrhosis. It showed fibrosis at lung bases. She has been referred here for further follow-up. She has some mild dyspnea on exertion but no other symptoms confined to the chest. She denies any cough, wheezing, sputum production, hemoptysis. She does not have any symptoms suggestive of connective tissue, autoimmune disease. She does have history of unspecified lung fibrosis in her mother. She does not have any exposures at work to asbestos or other inhalants.  Interim History: She continues to feel well. She does not report any worsening dyspnea, wheezing, cough, sputum production. She had a thyroid ultrasound and biopsy for her nodule found on the CT chest.There is no evidence of malignancy as per the patient She has been started on synthroid for hypothyroidism but the patient stopped taking it.  Outpatient Encounter Prescriptions as of 07/29/2016  Medication Sig  . aspirin 81 MG tablet Take 81 mg by mouth as needed.   Marland Kitchen levothyroxine (SYNTHROID, LEVOTHROID) 25 MCG tablet Take 1 tablet (25 mcg total) by mouth daily before breakfast.  . Omega-3 Fatty Acids (FISH OIL) 1000 MG CAPS Take by mouth.  . oxyCODONE-acetaminophen (PERCOCET/ROXICET) 5-325 MG per tablet Take 0.5 tablets by mouth daily as needed for pain.  . potassium chloride (KLOR-CON) 20 MEQ packet Take by mouth 2 (two) times daily.  . Zinc Sulfate (ZINC 15 PO) Take by mouth.   No facility-administered encounter medications on file as of 07/29/2016.     Allergies as of 07/29/2016 - Review Complete 07/29/2016  Allergen Reaction Noted  . Celecoxib      . Codeine  09/19/2009  . Ibuprofen    . Meloxicam    . Nsaids  10/07/2015  . Rofecoxib    . Sulfonamide derivatives      Past Medical History:  Diagnosis Date  . Allergy   . Diverticulosis   . DIVERTICULOSIS, COLON 01/03/2007  . Fatty infiltration of liver   . GERD (gastroesophageal reflux disease)   . Hepatitis, chronic active ([place])    NASH  . Hiatal hernia   . HIATAL HERNIA 12/07/2006  . Hyperlipidemia   . Low back pain     Past Surgical History:  Procedure Laterality Date  . APPENDECTOMY    . EYE SURGERY     macular hole  . ganglion cysts     left hand  . HAND SURGERY  1996  . SHOULDER SURGERY  2006   rt shoulder  . TONSILLECTOMY    . TUBAL LIGATION    . UTERINE SUSPENSION      Family History  Problem Relation Age of Onset  . Heart disease Mother   . Diabetes Mother   . Pancreatic cancer Paternal Uncle   . Colon cancer Paternal Uncle   . Stomach cancer Paternal Uncle   . Cancer Maternal Aunt     Stomach  . Esophageal cancer Maternal Aunt   . Esophageal cancer Maternal Uncle   . Rectal cancer Neg Hx     Social History   Social History  . Marital status: Divorced    Spouse name: N/A  .  Number of children: N/A  . Years of education: N/A   Occupational History  . office manager    Social History Main Topics  . Smoking status: Former Smoker    Packs/day: 0.75    Years: 15.00    Types: Cigarettes    Quit date: 06/01/1986  . Smokeless tobacco: Never Used  . Alcohol use 0.0 oz/week     Comment: rare  . Drug use: No  . Sexual activity: Not Currently   Other Topics Concern  . Not on file   Social History Narrative   Divorced. Lives alone. 2 children (son in [place], daughter in [place]). 1 granddaughter in [place].    Works in Occupational hygienist for communities in school. Bookkeeping/purchasing, HR piece.     Review of systems: Review of Systems  Constitutional: Negative for fever and chills.  HENT: Negative.   Eyes: Negative for blurred  vision.  Respiratory: as per HPI  Cardiovascular: Negative for chest pain and palpitations.  Gastrointestinal: Negative for vomiting, diarrhea, blood per rectum. Genitourinary: Negative for dysuria, urgency, frequency and hematuria.  Musculoskeletal: Negative for myalgias, back pain and joint pain.  Skin: Negative for itching and rash.  Neurological: Negative for dizziness, tremors, focal weakness, seizures and loss of consciousness.  Endo/Heme/Allergies: Negative for environmental allergies.  Psychiatric/Behavioral: Negative for depression, suicidal ideas and hallucinations.  All other systems reviewed and are negative.  Physical Exam: Blood pressure 124/70, pulse 78, height 5' 4.5" (1.638 m), weight 72.5 kg (159 lb 12.8 oz), SpO2 95 %. Gen:      No acute distress HEENT:  EOMI, sclera anicteric Neck:     No masses; no thyromegaly Lungs:    Clear to auscultation bilaterally; normal respiratory effort CV:         Regular rate and rhythm; no murmurs Abd:      + bowel sounds; soft, non-tender; no palpable masses, no distension Ext:    No edema; adequate peripheral perfusion Skin:      Warm and dry; no rash Neuro: alert and oriented x 3 Psych: normal mood and affect  Data Reviewed: CT abdomen 12/04/14- Reticulation at bases, unchanged since 2011 CT abdomen 06/02/11- Reticulation at bases. Images reviewed. 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  I have reviewed all images personally.  Serologies 09/05/15 CRP 0.4, sedimentation rate 43 Aldolase 11.2 P-ANCA 1:40 dsDNA- 6 Negative- Jo-1, centromere, RNP, RO, LA RA, CCP, Scl 70  PFTs 09/29/15 FVC 2.45 (80%) FEV1 2.18 (93%) F/F 89 TLC 79% DLCO 53% Mild restriction, moderately severe diffusion defect.  Assessment:  Basal pulmonary fibrosis All of her lab studies, imaging, PFTs were reviewed with her today. Her CT does show lung fibrosis in UIP versus NSIP pattern. However this is  stable compared to 2011. She does not have any exposure history or symptoms suggestive of connective tissue, autoimmune disease. Serologies are negative except for intermediate elevation in double-stranded DNA and mild elevation in ANCA of unclear significance. She does have a family history of unspecified pulmonary fibrosis in the mother. I suspect there may be a component of chronic aspiration causing the lung fibrosis. She follows up with Dr Fuller Plan but is not interested in getting a barium swallow.,   I had a long discussion about further follow-up with her including getting a biopsy and putting her on antifibrotic She is not thrilled about getting a biopsy or going on additional medications so we have decided to wait and watch. We will continue to evaluate her  with a CT scan. If there is progression of disease or worsening symptoms then I will discuss getting a biopsy and treatment with her again.   Plan/Recommendations: - Follow up high res CT of chest  Marshell Garfinkel MD [facility] Pager [phone] 07/29/2016, 10:53 AM  CC: Marin Olp, MD

## 2016-08-22 ENCOUNTER — Emergency Department (HOSPITAL_COMMUNITY): Payer: Medicare Other

## 2016-08-22 ENCOUNTER — Encounter (HOSPITAL_COMMUNITY): Payer: Self-pay | Admitting: Emergency Medicine

## 2016-08-22 ENCOUNTER — Emergency Department (HOSPITAL_COMMUNITY)
Admission: EM | Admit: 2016-08-22 | Discharge: 2016-08-23 | Disposition: A | Discharge status: Home / Self Care | Payer: Medicare Other | Attending: Emergency Medicine | Admitting: Emergency Medicine

## 2016-08-22 DIAGNOSIS — S32020B Wedge compression fracture of second lumbar vertebra, initial encounter for open fracture: Secondary | ICD-10-CM | POA: Diagnosis not present

## 2016-08-22 DIAGNOSIS — Y939 Activity, unspecified: Secondary | ICD-10-CM | POA: Insufficient documentation

## 2016-08-22 DIAGNOSIS — S32028A Other fracture of second lumbar vertebra, initial encounter for closed fracture: Secondary | ICD-10-CM | POA: Insufficient documentation

## 2016-08-22 DIAGNOSIS — Y999 Unspecified external cause status: Secondary | ICD-10-CM | POA: Diagnosis not present

## 2016-08-22 DIAGNOSIS — E119 Type 2 diabetes mellitus without complications: Secondary | ICD-10-CM | POA: Insufficient documentation

## 2016-08-22 DIAGNOSIS — Y929 Unspecified place or not applicable: Secondary | ICD-10-CM | POA: Insufficient documentation

## 2016-08-22 DIAGNOSIS — W19XXXA Unspecified fall, initial encounter: Secondary | ICD-10-CM

## 2016-08-22 DIAGNOSIS — M545 Low back pain: Secondary | ICD-10-CM | POA: Diagnosis not present

## 2016-08-22 DIAGNOSIS — Z87891 Personal history of nicotine dependence: Secondary | ICD-10-CM | POA: Diagnosis not present

## 2016-08-22 DIAGNOSIS — Z79899 Other long term (current) drug therapy: Secondary | ICD-10-CM | POA: Diagnosis not present

## 2016-08-22 DIAGNOSIS — S79911A Unspecified injury of right hip, initial encounter: Secondary | ICD-10-CM | POA: Diagnosis not present

## 2016-08-22 DIAGNOSIS — S3992XA Unspecified injury of lower back, initial encounter: Secondary | ICD-10-CM | POA: Diagnosis present

## 2016-08-22 DIAGNOSIS — W1830XA Fall on same level, unspecified, initial encounter: Secondary | ICD-10-CM | POA: Insufficient documentation

## 2016-08-22 DIAGNOSIS — S32020A Wedge compression fracture of second lumbar vertebra, initial encounter for closed fracture: Secondary | ICD-10-CM

## 2016-08-22 DIAGNOSIS — M25551 Pain in right hip: Secondary | ICD-10-CM | POA: Diagnosis not present

## 2016-08-22 LAB — URINALYSIS, ROUTINE W REFLEX MICROSCOPIC
Bilirubin Urine: NEGATIVE
GLUCOSE, UA: NEGATIVE mg/dL
Hgb urine dipstick: NEGATIVE
KETONES UR: NEGATIVE mg/dL
Leukocytes, UA: NEGATIVE
NITRITE: NEGATIVE
PH: 6 (ref 5.0–8.0)
Protein, ur: NEGATIVE mg/dL
SPECIFIC GRAVITY, URINE: 1.01 (ref 1.005–1.030)

## 2016-08-22 NOTE — ED Provider Notes (Signed)
[facility] DEPT Provider Note   CSN: [id number] Arrival date & time: 08/22/16  2114  By signing my name below, I, Ethelle Lyon Long, attest that this documentation has been prepared under the direction and in the presence of [place]. Mahi Zabriskie, PA-C. Electronically Signed: Ethelle Lyon Long, Scribe. 08/22/2016. 10:29 PM.  History   Chief Complaint Chief Complaint  Patient presents with  . Fall  . Back Pain  . Hip Pain   The history is provided by the patient and medical records. No language interpreter was used.    HPI Comments:  Heather Townsend is a 71 y.o. female with a PMHx of HLD, Fatty Liver Disease, Hiatal Hernia, Hepatitis, GERD, Diverticulosis, and h/o Low Back Pain, who presents to the Emergency Department complaining of sudden onset right hip pain s/p a fall today at 5:00PM. She reports falling on a slippery wooden deck at ground level today feeling a "pop" in her right hip upon striking said hip on the deck. After the fall, she continued to lay on the ground for five minutes before crawling up to the back door. Denies LOC, neck pain and head injury. Pt has associated symptoms of lower back pain and generalized right-sided abdominal pain radiating to the posterior right hip. She tried oxycodone, a heating pad, and hot bath at home with no relief of her pain. Ambulation and weight bearing exacerbate her pain. She has urinated s/p the fall without difficulty. Pt denies neck pain, numbness, tingling, hematuria, dysuria and any other complaints at this time.  No loss of bowl or bladder control.     Past Medical History:  Diagnosis Date  . Allergy   . Diverticulosis   . DIVERTICULOSIS, COLON 01/03/2007  . Fatty infiltration of liver   . GERD (gastroesophageal reflux disease)   . Hepatitis, chronic active ([place])    NASH  . Hiatal hernia   . HIATAL HERNIA 12/07/2006  . Hyperlipidemia   . Low back pain     Patient Active Problem List   Diagnosis Date Noted  . Left thyroid  nodule 11/06/2015  . Shortness of breath 08/16/2015  . Cervical disc disease 04/16/2014  . Type 2 diabetes mellitus ([place]) 03/08/2014  . Osteopenia 03/07/2014  . NASH (nonalcoholic steatohepatitis) 04/04/2013  . Rash, skin 01/25/2013  . Chest pain 05/11/2012  . Renal calculus or stone 04/27/2011  . GERD 09/05/2007  . Dyslipidemia 08/16/2007    Past Surgical History:  Procedure Laterality Date  . APPENDECTOMY    . EYE SURGERY     macular hole  . ganglion cysts     left hand  . HAND SURGERY  1996  . SHOULDER SURGERY  2006   rt shoulder  . TONSILLECTOMY    . TUBAL LIGATION    . UTERINE SUSPENSION      OB History    No data available       Home Medications    Prior to Admission medications   Medication Sig Start Date End Date Taking? Authorizing Provider  oxyCODONE-acetaminophen (PERCOCET/ROXICET) 5-325 MG per tablet Take 0.5 tablets by mouth every 8 (eight) hours as needed for moderate pain or severe pain.    Yes Historical Provider, MD  vitamin B-12 (CYANOCOBALAMIN) 1000 MCG tablet Take 1,000 mcg by mouth 3 (three) times a week.   Yes Historical Provider, MD  levothyroxine (SYNTHROID, LEVOTHROID) 25 MCG tablet Take 1 tablet (25 mcg total) by mouth daily before breakfast. Patient not taking: Reported on 08/22/2016 12/10/15   Renato Shin,  MD    Family History Family History  Problem Relation Age of Onset  . Heart disease Mother   . Diabetes Mother   . Pancreatic cancer Paternal Uncle   . Colon cancer Paternal Uncle   . Stomach cancer Paternal Uncle   . Cancer Maternal Aunt     Stomach  . Esophageal cancer Maternal Aunt   . Esophageal cancer Maternal Uncle   . Rectal cancer Neg Hx     Social History Social History  Substance Use Topics  . Smoking status: Former Smoker    Packs/day: 0.75    Years: 15.00    Types: Cigarettes    Quit date: 06/01/1986  . Smokeless tobacco: Never Used  . Alcohol use 0.0 oz/week     Comment: rare     Allergies   Celecoxib;  Codeine; Ibuprofen; Meloxicam; Nsaids; Rofecoxib; and Sulfonamide derivatives   Review of Systems Review of Systems  Gastrointestinal: Positive for abdominal pain (generalized).  Genitourinary: Negative for dysuria and hematuria.  Musculoskeletal: Positive for arthralgias and back pain. Negative for neck pain.  Neurological: Negative for syncope and numbness.  All other systems reviewed and are negative.    Physical Exam Updated Vital Signs BP 132/69 (BP Location: Left Arm)   Pulse 68   Temp 97.8 F (36.6 C) (Oral)   Resp 20   Ht 5\' 4"  (1.626 m)   Wt 155 lb (70.3 kg)   SpO2 94%   BMI 26.61 kg/m   Physical Exam  Constitutional: She appears well-developed and well-nourished. No distress.  HENT:  Head: Normocephalic and atraumatic.  Mouth/Throat: Oropharynx is clear and moist. No oropharyngeal exudate.  Eyes: Conjunctivae are normal.  Neck: Normal range of motion. Neck supple.  Full ROM without pain No midline or paraspinal tenderness  Cardiovascular: Normal rate, regular rhythm and intact distal pulses.   Pulmonary/Chest: Effort normal and breath sounds normal. No respiratory distress. She has no wheezes.  Abdominal: Soft. She exhibits no distension. There is no tenderness.  Musculoskeletal:  Slightly decreased ROM of the L-Spine d/t pain. Midline tenderness at the L5-S1 TTP over the right paraspinal muscle and SI joint at this level. No midline C-spine or T-spine tenderness.   Lymphadenopathy:    She has no cervical adenopathy.  Neurological: She is alert.  Speech is clear and goal oriented, follows commands Normal 5/5 strength in upper and lower extremities bilaterally including dorsiflexion and plantar flexion, strong and equal grip strength Sensation normal to light and sharp touch Moves extremities without ataxia, coordination intact Normal gait Normal balance  Skin: Skin is warm and dry. No rash noted. She is not diaphoretic. No erythema.  Psychiatric: She has a  normal mood and affect. Her behavior is normal.  Nursing note and vitals reviewed.    ED Treatments / Results  DIAGNOSTIC STUDIES:  Oxygen Saturation is 96% on RA, adeqaute by my interpretation.    COORDINATION OF CARE:  10:27 PM Discussed treatment plan with pt at bedside including XR of L-Spine and Right Hip with UA and pt agreed to plan. Pt was offered pain medication and a muscle relaxer but declined them.   Labs (all labs ordered are listed, but only abnormal results are displayed) Labs Reviewed  URINALYSIS, ROUTINE W REFLEX MICROSCOPIC - Abnormal; Notable for the following:       Result Value   Color, Urine STRAW (*)    All other components within normal limits    Radiology Dg Lumbar Spine Complete  Result Date: 08/22/2016 CLINICAL  DATA:  71 year old female with fall and back pain. EXAM: LUMBAR SPINE - COMPLETE 4+ VIEW COMPARISON:  Abdominal dated 12/04/2014 FINDINGS: The bones are osteopenic. There is mild age indeterminate L2 compression deformity which is progressed compared to the CT of 12/04/2014. Correlation with clinical exam and point tenderness is recommended. No definite acute fracture identified. There is no subluxation. Multilevel degenerative changes with bone spurring primarily at L2-L3. The visualized transverse and spinous processes appear intact. The soft tissues are grossly unremarkable. IMPRESSION: Age indeterminate mild L2 compression deformity. Correlation with clinical exam and point tenderness recommended. No other acute fracture. No acute subluxation. Electronically Signed   By: Anner Crete M.D.   On: 08/22/2016 23:13   Dg Hip Unilat W Or Wo Pelvis 2-3 Views Right  Result Date: 08/22/2016 CLINICAL DATA:  71 year old female with fall and right. EXAM: DG HIP (WITH OR WITHOUT PELVIS) 2-3V RIGHT COMPARISON:  Abdominal dated 12/04/2014 FINDINGS: There is no acute fracture or dislocation. The bones are osteopenic. There is mild osteoarthritic changes of the  hips. The soft tissues are grossly unremarkable. IMPRESSION: No acute fracture or dislocation. Electronically Signed   By: Anner Crete M.D.   On: 08/22/2016 23:15    Procedures Procedures (including critical care time)  Medications Ordered in ED Medications - No data to display   Initial Impression / Assessment and Plan / ED Course  I have reviewed the triage vital signs and the nursing notes.  Pertinent labs & imaging results that were available during my care of the patient were reviewed by me and considered in my medical decision making (see chart for details).     Patient with back pain.  No neurological deficits and normal neuro exam.  L2 compression fracture noted and likely the source of pt's pain.  Low risk mechanism.  No hematuria, doubt renal fracture or intra-abdominal pathology.  Abd remains soft on repeat exam. Patient can walk without assistance.  No loss of bowel or bladder control.  No concern for cauda equina.  No fever, night sweats, weight loss, h/o cancer, IVDU.  RICE protocol and pain medicine (with home Rx) indicated and discussed with patient. She states understanding and is in agreement with the plan.    Final Clinical Impressions(s) / ED Diagnoses   Final diagnoses:  Fall, initial encounter  Closed compression fracture of second lumbar vertebra, initial encounter ([place])  Right hip pain    New Prescriptions New Prescriptions   No medications on file   I personally performed the services described in this documentation, which was scribed in my presence. The recorded information has been reviewed and is accurate.     Jarrett Soho Sury Wentworth, PA-C 08/23/16 0017    Forde Dandy, MD 08/23/16 1235

## 2016-08-22 NOTE — ED Triage Notes (Signed)
Patient reports fall today. C/o right hip and lower back pain. Reports taking oxycodone for pain. Denies head injury and LOC.

## 2016-08-23 NOTE — Discharge Instructions (Signed)
1. Medications: tylenol and ibuprofen for pain, usual home medications 2. Treatment: rest, drink plenty of fluids,  3. Follow Up: Please followup with your primary doctor in 3-5 days for discussion of your diagnoses and further evaluation after today's visit; if you do not have a primary care doctor use the resource guide provided to find one; Please return to the ER for numbness, weakness, loss of bowel or bladder control, worsening pain or other conerns

## 2016-08-23 NOTE — ED Provider Notes (Addendum)
Medical screening examination/treatment/procedure(s) were conducted as a shared visit with non-physician practitioner(s) and myself.  I personally evaluated the patient during the encounter.   EKG Interpretation None      71 year old female who presents with mechanical fall. No history of anticoagulation. Slipped on a wooden deck this evening and landing on her low back and right posterior hip. No headstrike, neck pain, or LOC. Since accident with low back pain and right hip pain traveling into low abdomen. Pain worse with movement. Has been ambulatory. Tried heating pad and home oxycodone with persistent pain. No numbness or weakness, n/v.   Well appearing. Vitals within normal limits. Tenderness primarily to palpation over low back but neurologically in tact. Abdomen is soft and benign. With no deformities and normal ROM of hip. X-rays of low back and hip visualized. There is potential L2 compression fracture likely causing pain. Has been ambulatory. Will treat pain and refer for outpatient management. Strict return and follow-up instructions reviewed. She expressed understanding of all discharge instructions and felt comfortable with the plan of care.    Forde Dandy, MD 08/23/16 [id number]    Forde Dandy, MD 08/23/16 [phone]

## 2016-10-01 ENCOUNTER — Ambulatory Visit (INDEPENDENT_AMBULATORY_CARE_PROVIDER_SITE_OTHER)
Admission: RE | Admit: 2016-10-01 | Discharge: 2016-10-01 | Disposition: A | Discharge status: Home / Self Care | Payer: Medicare Other | Source: Ambulatory Visit | Attending: Pulmonary Disease | Admitting: Pulmonary Disease

## 2016-10-01 DIAGNOSIS — R0600 Dyspnea, unspecified: Secondary | ICD-10-CM | POA: Diagnosis not present

## 2016-10-01 DIAGNOSIS — J8410 Pulmonary fibrosis, unspecified: Secondary | ICD-10-CM | POA: Diagnosis not present

## 2016-10-20 ENCOUNTER — Ambulatory Visit (INDEPENDENT_AMBULATORY_CARE_PROVIDER_SITE_OTHER): Payer: Medicare Other | Admitting: Pulmonary Disease

## 2016-10-20 ENCOUNTER — Encounter: Payer: Self-pay | Admitting: Pulmonary Disease

## 2016-10-20 VITALS — BP 124/80 | HR 86 | Ht 64.5 in | Wt 157.0 lb

## 2016-10-20 DIAGNOSIS — I8500 Esophageal varices without bleeding: Secondary | ICD-10-CM

## 2016-10-20 DIAGNOSIS — J8410 Pulmonary fibrosis, unspecified: Secondary | ICD-10-CM

## 2016-10-20 NOTE — Patient Instructions (Addendum)
I reviewed your CT scan today. It shows stable fibrosis compared to 2017 We will continue to monitor this with a repeat scan in 1 year. Follow-up in clinic after scan  We will refer you to 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

## 2016-10-20 NOTE — Progress Notes (Signed)
Heather Townsend    [id number]    Heather Townsend  Primary Care Physician:Hunter, Brayton Mars, MD  Referring Physician: Marin Townsend, Heather Townsend, [place] [phone] Chief complaint:   Follow up for lung fibrosis.  HPI: Heather Townsend is a 71 year old with history of Karlene Lineman cirrhosis, GERD, hiatal hernia. She had an MRI of the abdomen as a part of follow-up for cirrhosis. It showed fibrosis at lung bases. She has been referred here for further follow-up. She has some mild dyspnea on exertion but no other symptoms confined to the chest. She denies any cough, wheezing, sputum production, hemoptysis. She does not have any symptoms suggestive of connective tissue, autoimmune disease. She does have history of unspecified lung fibrosis in her mother. She does not have any exposures at work to asbestos or other inhalants.  Interim History: She continues to feel well. She does not report any worsening dyspnea, wheezing, cough, sputum production. She had a thyroid ultrasound and biopsy for her nodule found on the CT chest.There is no evidence of malignancy as per the patient She has been started on synthroid for hypothyroidism but the patient stopped taking it.  Outpatient Encounter Prescriptions as of 10/20/2016  Medication Sig  . levothyroxine (SYNTHROID, LEVOTHROID) 25 MCG tablet Take 1 tablet (25 mcg total) by mouth daily before breakfast. (Patient not taking: Reported on 08/22/2016)  . oxyCODONE-acetaminophen (PERCOCET/ROXICET) 5-325 MG per tablet Take 0.5 tablets by mouth every 8 (eight) hours as needed for moderate pain or severe pain.   . vitamin B-12 (CYANOCOBALAMIN) 1000 MCG tablet Take 1,000 mcg by mouth 3 (three) times a week.   No facility-administered encounter medications on file as of 10/20/2016.     Allergies as of 10/20/2016 - Review Complete 10/20/2016  Allergen Reaction Noted  . Celecoxib    . Codeine  09/19/2009  . Ibuprofen    . Meloxicam    .  Nsaids  10/07/2015  . Rofecoxib    . Sulfonamide derivatives      Past Medical History:  Diagnosis Date  . Allergy   . Diverticulosis   . DIVERTICULOSIS, COLON 01/03/2007  . Fatty infiltration of liver   . GERD (gastroesophageal reflux disease)   . 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 Hiatal hernia   . HIATAL HERNIA 12/07/2006  . Hyperlipidemia   . Low back pain     Past Surgical History:  Procedure Laterality Date  . APPENDECTOMY    . EYE SURGERY     macular hole  . ganglion cysts     left hand  . HAND SURGERY  1996  . SHOULDER SURGERY  2006   rt shoulder  . TONSILLECTOMY    . TUBAL LIGATION    . UTERINE SUSPENSION      Family History  Problem Relation Age of Onset  . Heart disease Mother   . Diabetes Mother   . Pancreatic cancer Paternal Uncle   . Colon cancer Paternal Uncle   . Stomach cancer Paternal Uncle   . Cancer Maternal Aunt        Stomach  . Esophageal cancer Maternal Aunt   . Esophageal cancer Maternal Uncle   . Rectal cancer Neg Hx     Social History   Social History  . Marital status: Divorced    Spouse name: N/A  . Number of children: N/A  . Years of education: N/A   Occupational History  .  office manager    Social History Main Topics  . Smoking status: Former Smoker    Packs/day: 0.75    Years: 15.00    Types: Cigarettes    Quit date: 06/01/1986  . Smokeless tobacco: Never Used  . Alcohol use 0.0 oz/week     Comment: rare  . Drug use: No  . Sexual activity: Not Currently   Other Topics Concern  . Not on file   Social History Narrative   Divorced. Lives alone. 2 children (son in [place], daughter in [place]). 1 granddaughter in [place].    Works in Occupational hygienist for communities in school. Bookkeeping/purchasing, HR piece.     Review of systems: Review of Systems  Constitutional: Negative for fever and chills.  HENT: Negative.   Eyes: Negative for blurred vision.  Respiratory: as per HPI  Cardiovascular: Negative  for chest pain and palpitations.  Gastrointestinal: Negative for vomiting, diarrhea, blood per rectum. Genitourinary: Negative for dysuria, urgency, frequency and hematuria.  Musculoskeletal: Negative for myalgias, back pain and joint pain.  Skin: Negative for itching and rash.  Neurological: Negative for dizziness, tremors, focal weakness, seizures and loss of consciousness.  Endo/Heme/Allergies: Negative for environmental allergies.  Psychiatric/Behavioral: Negative for depression, suicidal ideas and hallucinations.  All other systems reviewed and are negative.  Physical Exam: Blood pressure 124/70, pulse 78, height 5' 4.5" (1.638 m), weight 72.5 kg (159 lb 12.8 oz), SpO2 95 %. Gen:      No acute distress HEENT:  EOMI, sclera anicteric Neck:     No masses; no thyromegaly Lungs:    Clear to auscultation bilaterally; normal respiratory effort CV:         Regular rate and rhythm; no murmurs Abd:      + bowel sounds; soft, non-tender; no palpable masses, no distension Ext:    No edema; adequate peripheral perfusion Skin:      Warm and dry; no rash Neuro: alert and oriented x 3 Psych: normal mood and affect  Data Reviewed: CT abdomen 12/04/14- Reticulation at bases, unchanged since 2011 CT abdomen 06/02/11- Reticulation at bases. Images reviewed. 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 NSIP fibrosis High res CT 10/01/16- ILD, NSIP fibrosis, unchanged.  I have reviewed all images personally.  Serologies 09/05/15 CRP 0.4, sedimentation rate 43 Aldolase 11.2 P-ANCA 1:40 dsDNA- 6 Negative- Jo-1, centromere, RNP, RO, LA RA, CCP, Scl 70  PFTs 09/29/15 FVC 2.45 (80%) FEV1 2.18 (93%) F/F 89 TLC 79% DLCO 53% Mild restriction, moderately severe diffusion defect.  Assessment:  Basal pulmonary fibrosis Her CT shows lung fibrosis in NSIP pattern. However this is stable compared to 2011. She does not have any exposure history or symptoms suggestive of  connective tissue, autoimmune disease. Serologies are negative except for intermediate elevation in double-stranded DNA and mild elevation in ANCA of unclear significance. She does have a family history of unspecified pulmonary fibrosis in the mother. I suspect there may be a component of chronic aspiration causing the lung fibrosis.   I had a long discussion about further follow-up with her including getting a biopsy and putting her on antifibrotic She is not thrilled about getting a biopsy or going on additional medications so we have decided to wait and watch. We will continue to evaluate her with CT scans. If there is progression of disease or worsening symptoms then I will discuss getting a biopsy and treatment with her again.   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 She had an  normal upper endoscopy in March 2017. Refer back to [name] Plan mild GI for evaluation.  Plan/Recommendations: - Follow up high res CT of chest in 1 year - Refer to GI for evaluation of varices  Marshell Garfinkel MD [facility] Pager [phone] 10/20/2016, 4:39 PM  CC: Heather Olp, MD

## 2016-12-07 ENCOUNTER — Ambulatory Visit (INDEPENDENT_AMBULATORY_CARE_PROVIDER_SITE_OTHER): Payer: Medicare Other | Admitting: Gastroenterology

## 2016-12-07 ENCOUNTER — Encounter (INDEPENDENT_AMBULATORY_CARE_PROVIDER_SITE_OTHER): Payer: Self-pay

## 2016-12-07 ENCOUNTER — Encounter: Payer: Self-pay | Admitting: Gastroenterology

## 2016-12-07 VITALS — BP 124/60 | HR 80 | Wt 160.0 lb

## 2016-12-07 DIAGNOSIS — R933 Abnormal findings on diagnostic imaging of other parts of digestive tract: Secondary | ICD-10-CM | POA: Diagnosis not present

## 2016-12-07 DIAGNOSIS — K7460 Unspecified cirrhosis of liver: Secondary | ICD-10-CM

## 2016-12-07 DIAGNOSIS — R079 Chest pain, unspecified: Secondary | ICD-10-CM | POA: Diagnosis not present

## 2016-12-07 DIAGNOSIS — K7581 Nonalcoholic steatohepatitis (NASH): Secondary | ICD-10-CM | POA: Diagnosis not present

## 2016-12-07 NOTE — Progress Notes (Addendum)
    History of Present Illness: This is a 71 year old female followed at [facility] for well compensated NASH cirrhosis. She underwent EGD as below to screen for varices. Recent CT scan and recent US below. She relates an occasional cramp-like "catch" in her right lateral chest that has occurred intermittently for many years and has not changed in frequency or severity. She relates intermittent sharp pain underneath her left breast not associated with meals or exertion that has been bothersome for several months. Does not appear to be related to her right lateral chest pain and the quality of the pain is also different.  EGD 07/2015 was normal. No varices.   RUQ [place] 07/25/2016 IMPRESSION: Diffusely heterogeneous, increased echotexture throughout the liver with nodular contours compatible with cirrhosis. Previously seen hypoechoic area in the right hepatic lobe is again noted. This area was not seen on prior MRI and likely represents area of focal fat sparing.  Chest CT 10/02/2016 IMPRESSION: 1. 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 2. Aortic atherosclerosis. 3. Cirrhosis.  Mild gastroesophageal varices.  Current Medications, Allergies, Past Medical History, Past Surgical History, Family History and Social History were reviewed in [employer] record.  Physical Exam: General: Well developed, well nourished, no acute distress Head: Normocephalic and atraumatic Eyes:  sclerae anicteric, EOMI Ears: Normal auditory acuity Mouth: No deformity or lesions Lungs: Clear throughout to auscultation Heart: Regular rate and rhythm; no murmurs, rubs or bruits Abdomen: Soft, non tender and non distended. No masses, hepatosplenomegaly or hernias noted. Normal Bowel sounds Musculoskeletal: Symmetrical with no gross deformities  Pulses:  Normal pulses  noted Extremities: No clubbing, cyanosis, edema or deformities noted Neurological: Alert oriented x 4, grossly nonfocal Psychological:  Alert and cooperative. Tearful and anxious talking about her chest pain and a friend who passed away earlier this year.   Assessment and Recommendations:  1. NASH cirrhosis with varices noted on recent chest CT but not noted within the esophageal lumen on last EGD. Continue every six-month follow-up with [facility]. She is strongly advised to follow up with her PCP regarding management of her diabetes and hyperlipidemia and to treat these conditions appropriately. Schedule EGD to evaluate for esophageal varices. The risks (including bleeding, perforation, infection, missed lesions, medication reactions and possible hospitalization or surgery if complications occur), benefits, and alternatives to endoscopy with possible biopsy and possible dilation were discussed with the patient and they consent to proceed.   2. Personal history of adenomatous colon polyps. Five-year interval surveillance colonoscopy is due in November 2019.  3. Right chest wall musculoskeletal pain. Given the chronicity of her complaints, recent chest CT and recent abdominal ultrasound no further GI evaluation is planned at this time.  4. Left chest pain, etiology unclear. Follow-up with PCP.

## 2016-12-07 NOTE — Patient Instructions (Signed)
You have been scheduled for an endoscopy. Please follow written instructions given to you at your visit today. If you use inhalers (even only as needed), please bring them with you on the day of your procedure. Your physician has requested that you go to www.startemmi.com and enter the access code given to you at your visit today. This web site gives a general overview about your procedure. However, you should still follow specific instructions given to you by our office regarding your preparation for the procedure.  Thank you for choosing me and [facility].  Malcolm T. Stark, Jr., MD., FACG  

## 2017-01-11 ENCOUNTER — Other Ambulatory Visit: Payer: Self-pay | Admitting: Nurse Practitioner

## 2017-01-11 DIAGNOSIS — K7581 Nonalcoholic steatohepatitis (NASH): Secondary | ICD-10-CM | POA: Diagnosis not present

## 2017-01-25 ENCOUNTER — Encounter: Payer: Self-pay | Admitting: Gastroenterology

## 2017-01-25 ENCOUNTER — Ambulatory Visit (AMBULATORY_SURGERY_CENTER): Payer: Medicare Other | Admitting: Gastroenterology

## 2017-01-25 VITALS — BP 128/73 | HR 67 | Temp 97.3°F | Resp 11 | Ht 64.0 in | Wt 160.0 lb

## 2017-01-25 DIAGNOSIS — K3189 Other diseases of stomach and duodenum: Secondary | ICD-10-CM | POA: Diagnosis not present

## 2017-01-25 DIAGNOSIS — I8500 Esophageal varices without bleeding: Secondary | ICD-10-CM | POA: Diagnosis not present

## 2017-01-25 DIAGNOSIS — R933 Abnormal findings on diagnostic imaging of other parts of digestive tract: Secondary | ICD-10-CM | POA: Diagnosis not present

## 2017-01-25 DIAGNOSIS — K209 Esophagitis, unspecified without bleeding: Secondary | ICD-10-CM

## 2017-01-25 DIAGNOSIS — R935 Abnormal findings on diagnostic imaging of other abdominal regions, including retroperitoneum: Secondary | ICD-10-CM | POA: Diagnosis not present

## 2017-01-25 DIAGNOSIS — K7469 Other cirrhosis of liver: Secondary | ICD-10-CM

## 2017-01-25 MED ORDER — SODIUM CHLORIDE 0.9 % IV SOLN: 500.0000 mL | INTRAVENOUS | Status: DC

## 2017-01-25 MED ORDER — PANTOPRAZOLE SODIUM 40 MG PO TBEC: 40.0000 mg | DELAYED_RELEASE_TABLET | Freq: Every day | ORAL | 3 refills | Status: DC

## 2017-01-25 NOTE — Progress Notes (Signed)
Called to room to assist during endoscopic procedure.  Patient ID and intended procedure confirmed with present staff. Received instructions for my participation in the procedure from the performing physician.  

## 2017-01-25 NOTE — Patient Instructions (Signed)
Impression/Recommendations:  Esophagitis handout given to patient. Hiatal hernia handout given to patient.  Resume previous diet. Continue present medications.  Await pathology results.  Repeat upper endoscopy in 2 years for surveillance.  Protonix (pantoprazole) 40 mg. Daily by mouth indefinitely.  YOU HAD AN ENDOSCOPIC PROCEDURE TODAY AT [facility]:   Refer to the procedure report that was given to you for any specific questions about what was found during the examination.  If the procedure report does not answer your questions, please call your gastroenterologist to clarify.  If you requested that your care partner not be given the details of your procedure findings, then the procedure report has been included in a sealed envelope for you to review at your convenience later.  YOU SHOULD EXPECT: Some feelings of bloating in the abdomen. Passage of more gas than usual.  Walking can help get rid of the air that was put into your GI tract during the procedure and reduce the bloating. If you had a lower endoscopy (such as a colonoscopy or flexible sigmoidoscopy) you may notice spotting of blood in your stool or on the toilet paper. If you underwent a bowel prep for your procedure, you may not have a normal bowel movement for a few days.  Please Note:  You might notice some irritation and congestion in your nose or some drainage.  This is from the oxygen used during your procedure.  There is no need for concern and it should clear up in a day or so.  SYMPTOMS TO REPORT IMMEDIATELY:   Following upper endoscopy (EGD)  Vomiting of blood or coffee ground material  New chest pain or pain under the shoulder blades  Painful or persistently difficult swallowing  New shortness of breath  Fever of 100F or higher  Black, tarry-looking stools  For urgent or emergent issues, a gastroenterologist can be reached at any hour by calling [phone].   DIET:  We do recommend a  small meal at first, but then you may proceed to your regular diet.  Drink plenty of fluids but you should avoid alcoholic beverages for 24 hours.  ACTIVITY:  You should plan to take it easy for the rest of today and you should NOT DRIVE or use heavy machinery until tomorrow (because of the sedation medicines used during the test).    FOLLOW UP: Our staff will call the number listed on your records the next business day following your procedure to check on you and address any questions or concerns that you may have regarding the information given to you following your procedure. If we do not reach you, we will leave a message.  However, if you are feeling well and you are not experiencing any problems, there is no need to return our call.  We will assume that you have returned to your regular daily activities without incident.  If any biopsies were taken you will be contacted by phone or by letter within the next 1-3 weeks.  Please call us at [phone] if you have not heard about the biopsies in 3 weeks.    SIGNATURES/CONFIDENTIALITY: You and/or your care partner have signed paperwork which will be entered into your electronic medical record.  These signatures attest to the fact that that the information above on your After Visit Summary has been reviewed and is understood.  Full responsibility of the confidentiality of this discharge information lies with you and/or your care-partner.

## 2017-01-25 NOTE — Op Note (Signed)
[facility] Patient Name: Lawrence Roldan Procedure Date: 01/25/2017 10:03 AM MRN: [id number] Endoscopist: Ladene Artist , MD Age: 71 Referring MD:  Date of Birth: 1945-07-18 Gender: Female Account #: [id number] Procedure:                Upper GI endoscopy Indications:              Abnormal CT of the GI tract, Cirrhosis Medicines:                Monitored Anesthesia Care Procedure:                Pre-Anesthesia Assessment:                           - Prior to the procedure, a History and Physical                            was performed, and patient medications and                            allergies were reviewed. The patient's tolerance of                            previous anesthesia was also reviewed. The risks                            and benefits of the procedure and the sedation                            options and risks were discussed with the patient.                            All questions were answered, and informed consent                            was obtained. Prior Anticoagulants: The patient has                            taken no previous anticoagulant or antiplatelet                            agents. ASA Grade Assessment: II - A patient with                            mild systemic disease. After reviewing the risks                            and benefits, the patient was deemed in                            satisfactory condition to undergo the procedure.                           After obtaining informed consent, the endoscope was  passed under direct vision. Throughout the                            procedure, the patient's blood pressure, pulse, and                            oxygen saturations were monitored continuously. The                            Endoscope was introduced through the mouth, and                            advanced to the second part of duodenum. The upper                            GI endoscopy was  accomplished without difficulty.                            The patient tolerated the procedure well. Scope In: Scope Out: Findings:                 LA Grade A (one or more mucosal breaks less than 5                            mm, not extending between tops of 2 mucosal folds)                            esophagitis with no bleeding was found at the                            gastroesophageal junction.                           One mild benign-appearing, intrinsic stenosis was                            found at the gastroesophageal junction. This                            measured 1.5 cm (inner diameter) and was traversed.                           The exam of the esophagus was otherwise normal. No                            varices noted.                           A small hiatal hernia was present.                           Localized moderately erythematous mucosa without                            bleeding was found in  the gastric antrum. Biopsies                            were taken with a cold forceps for histology.                           The exam of the stomach was otherwise normal. No                            varices noted.                           The duodenal bulb and second portion of the                            duodenum were normal. Complications:            No immediate complications. Estimated Blood Loss:     Estimated blood loss was minimal. Impression:               - LA Grade A reflux esophagitis.                           - Benign-appearing esophageal stenosis.                           - Small hiatal hernia.                           - Erythematous mucosa in the antrum. Biopsied.                           - Normal duodenal bulb and second portion of the                            duodenum. Recommendation:           - Patient has a contact number available for                            emergencies. The signs and symptoms of potential                             delayed complications were discussed with the                            patient. Return to normal activities tomorrow.                            Written discharge instructions were provided to the                            patient.                           - Resume previous diet.                           -  Continue present medications.                           - Await pathology results.                           - Repeat upper endoscopy in 2 years for                            surveillance.                           - Protonix (pantoprazole) 40 mg PO daily                            indefinitely, 1 year of refills. Ladene Artist, MD 01/25/2017 10:20:36 AM This report has been signed electronically.

## 2017-01-25 NOTE — Progress Notes (Signed)
Report given to PACU, vss 

## 2017-01-26 ENCOUNTER — Telehealth: Payer: Self-pay | Admitting: *Deleted

## 2017-01-26 NOTE — Telephone Encounter (Signed)
  Follow up Call-  Call back number 01/25/2017 08/02/2015  Post procedure Call Back phone  # [phone]  Permission to leave phone message Yes Yes  Some recent data might be hidden     Patient questions:  Do you have a fever, pain , or abdominal swelling? No. Pain Score  0 *  Have you tolerated food without any problems? Yes.    Have you been able to return to your normal activities? Yes.    Do you have any questions about your discharge instructions: Diet   No. Medications  No. Follow up visit  No.  Do you have questions or concerns about your Care? No.  Actions: * If pain score is 4 or above: No action needed, pain <4.

## 2017-01-27 DIAGNOSIS — M79645 Pain in left finger(s): Secondary | ICD-10-CM | POA: Diagnosis not present

## 2017-01-27 DIAGNOSIS — R52 Pain, unspecified: Secondary | ICD-10-CM | POA: Diagnosis not present

## 2017-01-27 DIAGNOSIS — M19042 Primary osteoarthritis, left hand: Secondary | ICD-10-CM | POA: Diagnosis not present

## 2017-01-28 ENCOUNTER — Encounter: Payer: Self-pay | Admitting: Gastroenterology

## 2017-02-03 ENCOUNTER — Other Ambulatory Visit: Payer: Medicare Other

## 2017-02-11 ENCOUNTER — Ambulatory Visit
Admission: RE | Admit: 2017-02-11 | Discharge: 2017-02-11 | Disposition: A | Discharge status: Home / Self Care | Payer: Medicare Other | Source: Ambulatory Visit | Attending: Nurse Practitioner | Admitting: Nurse Practitioner

## 2017-02-11 DIAGNOSIS — K7581 Nonalcoholic steatohepatitis (NASH): Secondary | ICD-10-CM

## 2017-02-11 DIAGNOSIS — K7460 Unspecified cirrhosis of liver: Secondary | ICD-10-CM | POA: Diagnosis not present

## 2017-03-04 ENCOUNTER — Other Ambulatory Visit: Payer: Self-pay | Admitting: Nurse Practitioner

## 2017-03-04 DIAGNOSIS — K769 Liver disease, unspecified: Secondary | ICD-10-CM

## 2017-03-04 DIAGNOSIS — K7469 Other cirrhosis of liver: Secondary | ICD-10-CM

## 2017-03-17 ENCOUNTER — Ambulatory Visit
Admission: RE | Admit: 2017-03-17 | Discharge: 2017-03-17 | Disposition: A | Discharge status: Home / Self Care | Payer: Medicare Other | Source: Ambulatory Visit | Attending: Nurse Practitioner | Admitting: Nurse Practitioner

## 2017-03-17 DIAGNOSIS — K7469 Other cirrhosis of liver: Secondary | ICD-10-CM

## 2017-03-17 DIAGNOSIS — K769 Liver disease, unspecified: Secondary | ICD-10-CM

## 2017-03-17 DIAGNOSIS — K7460 Unspecified cirrhosis of liver: Secondary | ICD-10-CM | POA: Diagnosis not present

## 2017-03-17 MED ORDER — GADOXETATE DISODIUM 0.25 MMOL/ML IV SOLN: 8.0000 mL | Freq: Once | INTRAVENOUS | Status: DC | PRN

## 2017-04-08 IMAGING — US US SOFT TISSUE HEAD/NECK
1 series · 14 of 25 positions shown · non-contrast
Comparison: None.

CLINICAL DATA: Anterior mediastinal tumor

EXAM:
THYROID ULTRASOUND
TECHNIQUE: Ultrasound examination of the thyroid gland and adjacent soft
tissues was performed.

[Series 1: us soft tissue head/neck · 0.05mm/px · 54 acquisitions, 14 frames shown]
[im 1/54]
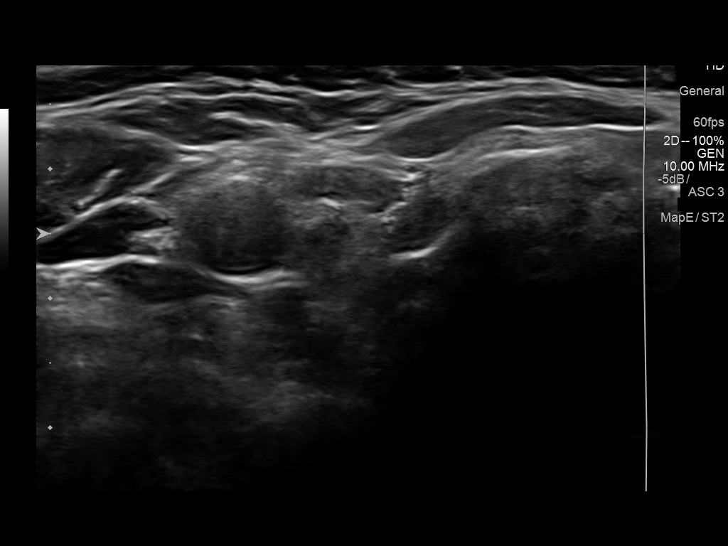
[im 5/54]
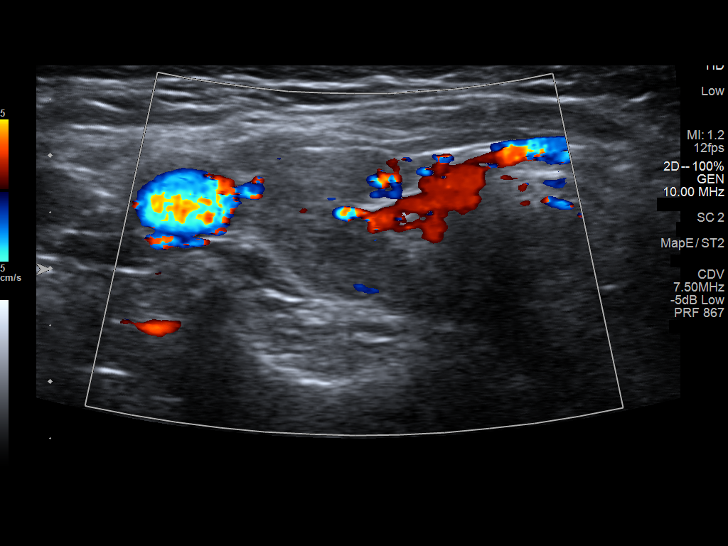
[im 9/54]
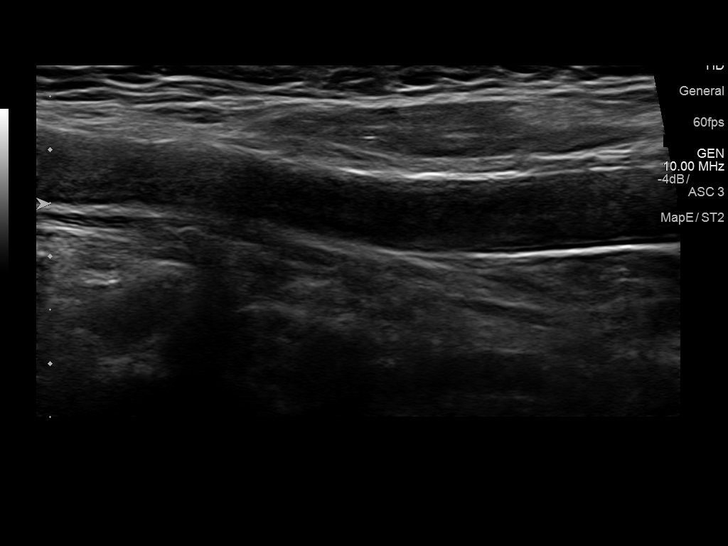
[im 14/54]
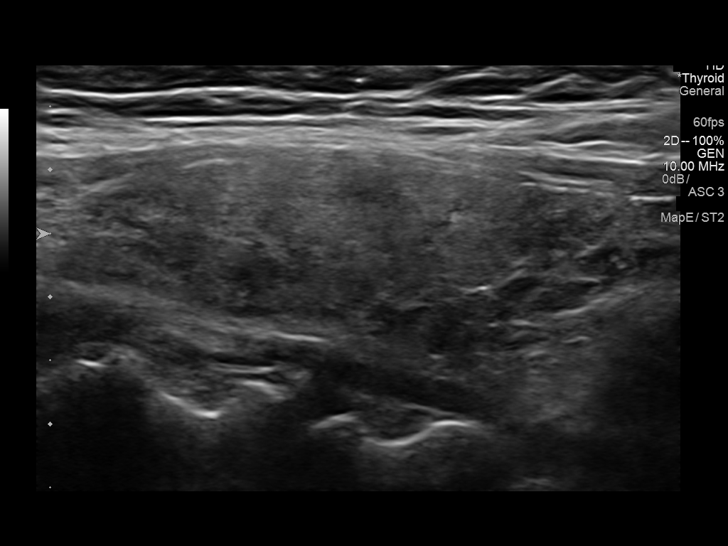
[im 18/54]
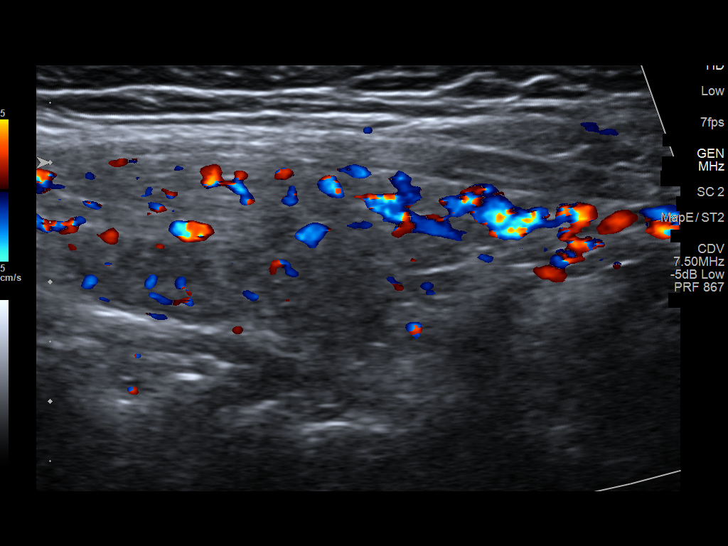
[im 20/54]
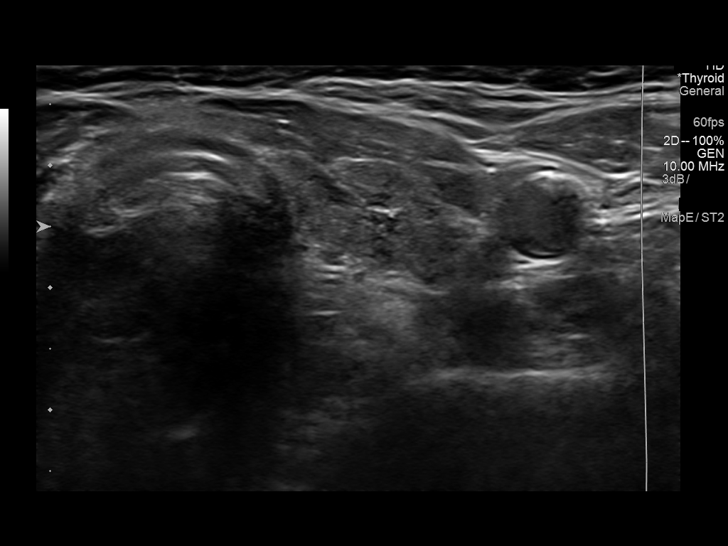
[im 25/54]
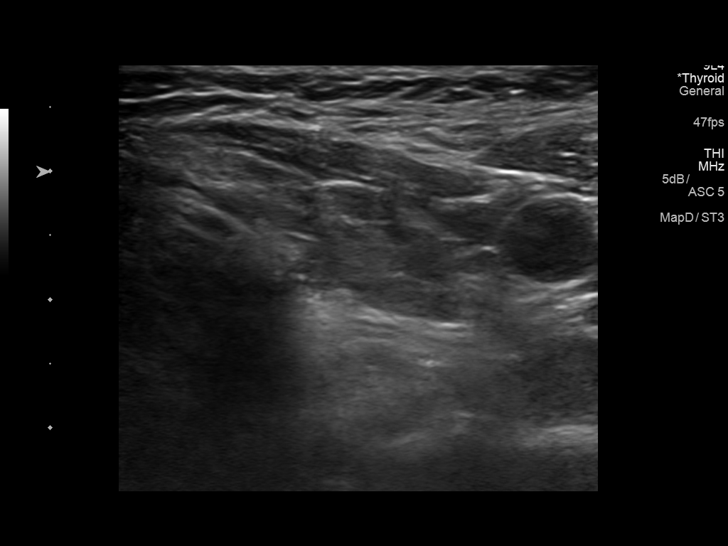
[im 29/54]
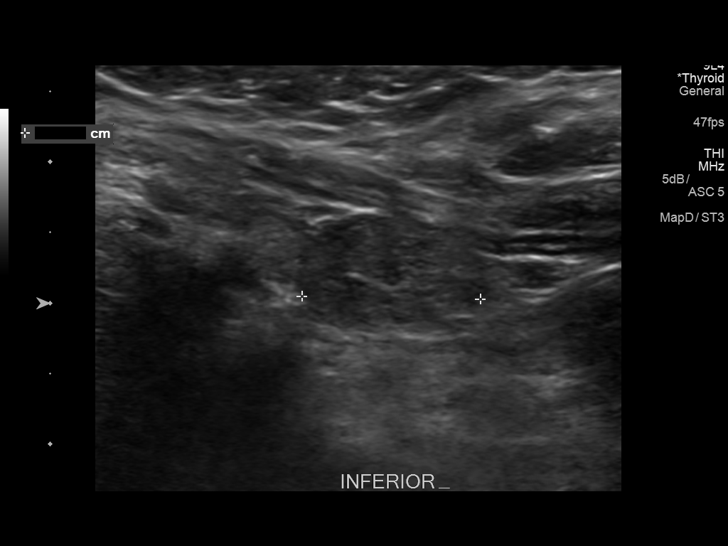
[im 34/54]
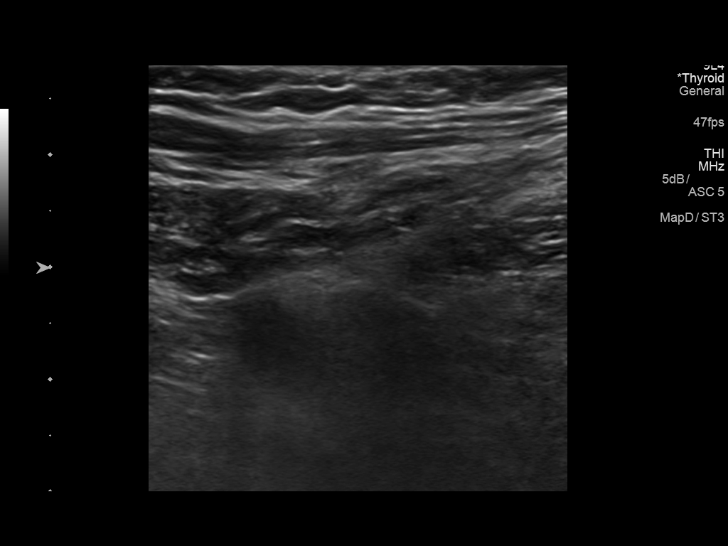
[im 36/54]
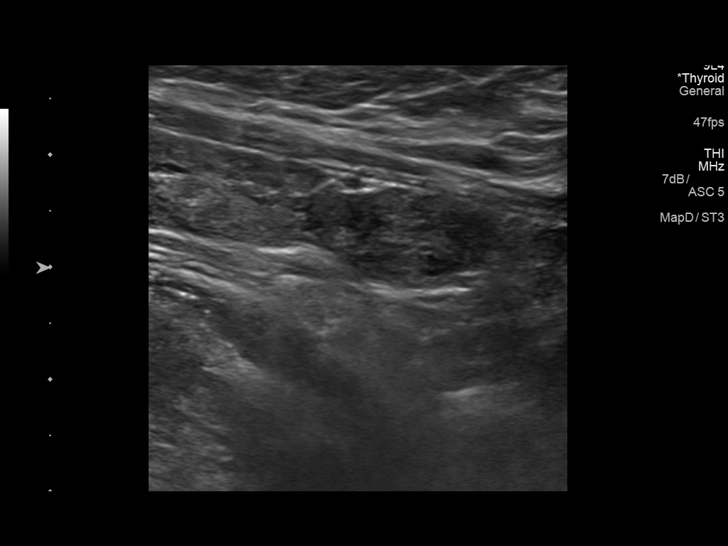
[im 40/54]
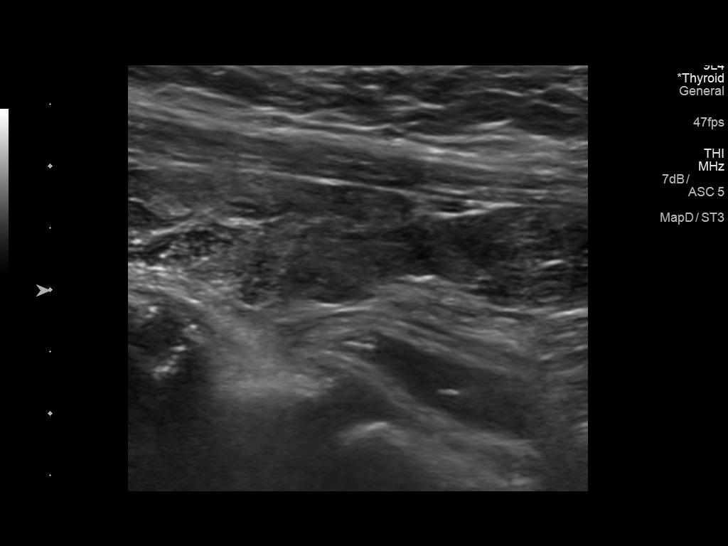
[im 45/54]
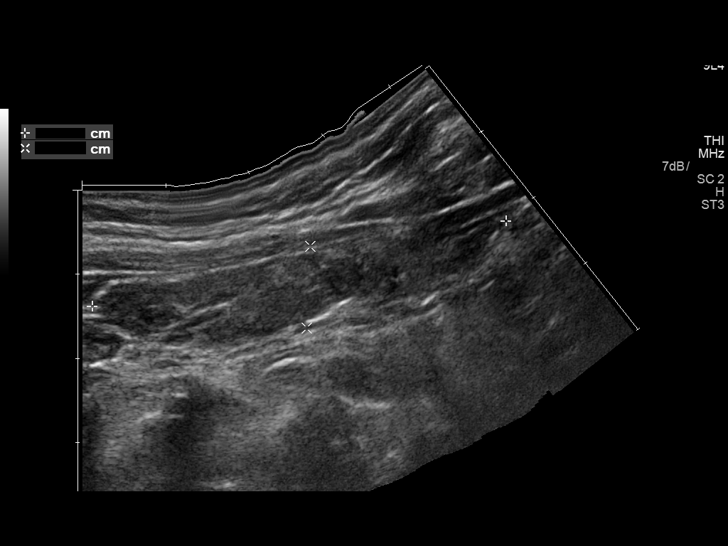
[im 49/54]
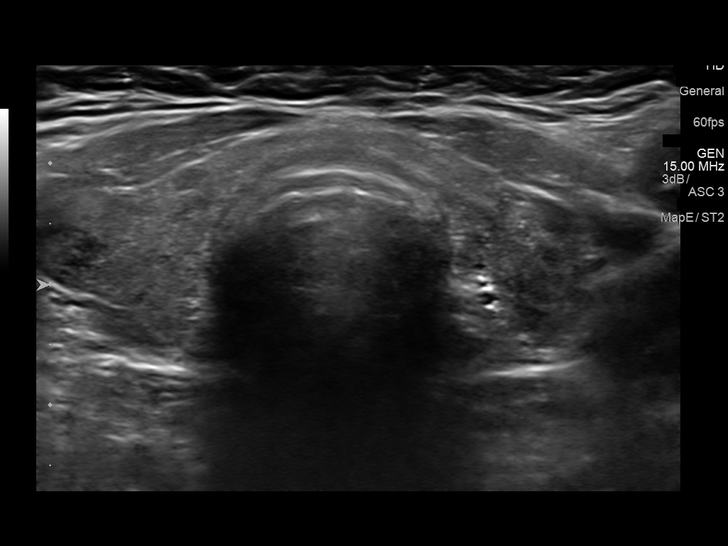
[im 54/54]
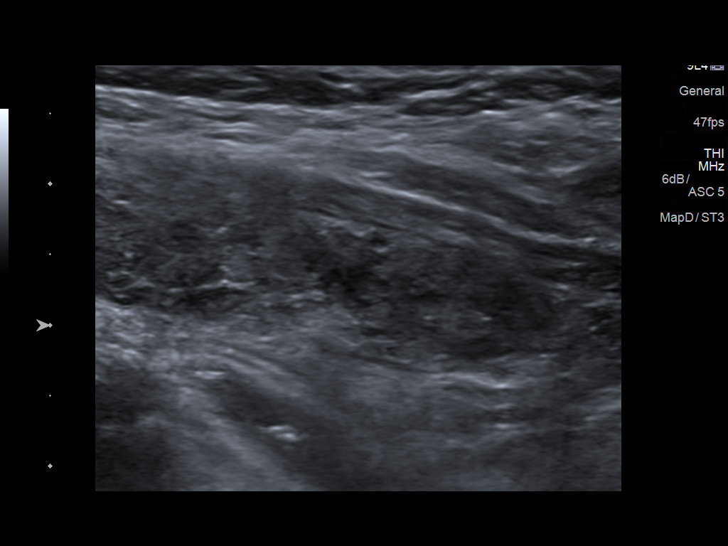

[14 of 25 positions shown; findings below may reference images not displayed]

FINDINGS: Right thyroid lobe

Measurements: 4.8 x 1.9 x 1.8 cm. Heterogeneous without focal
nodule.

Left thyroid lobe

Measurements: 5.0 x 1.0 x 1.3 cm. Solid lower pole nodule measures
1.6 x 0.9 x 1.8 cm.

Isthmus

Thickness: 3 mm.  No nodules visualized.

Lymphadenopathy

None visualized.
IMPRESSION: Solitary left lower pole nodule measures 1.6 x 0.9 x 1.8 cm.
Findings meet consensus criteria for biopsy. Ultrasound-guided fine
needle aspiration should be considered, as per the consensus
statement: Management of Thyroid Nodules Detected at US: Society of
Radiologists in Ultrasound Consensus Conference Statement. Radiology

## 2017-06-22 DIAGNOSIS — G3184 Mild cognitive impairment, so stated: Secondary | ICD-10-CM | POA: Diagnosis not present

## 2017-06-22 DIAGNOSIS — G5601 Carpal tunnel syndrome, right upper limb: Secondary | ICD-10-CM | POA: Diagnosis not present

## 2017-06-22 DIAGNOSIS — M5416 Radiculopathy, lumbar region: Secondary | ICD-10-CM | POA: Diagnosis not present

## 2017-06-22 DIAGNOSIS — M5412 Radiculopathy, cervical region: Secondary | ICD-10-CM | POA: Diagnosis not present

## 2017-06-24 ENCOUNTER — Telehealth: Payer: Self-pay

## 2017-06-24 NOTE — Telephone Encounter (Signed)
Call to Ms. Dutter and left VM to call and schedule AWV at [facility] on A Thursday when I am working there.  Kappa RN

## 2017-07-05 DIAGNOSIS — K7689 Other specified diseases of liver: Secondary | ICD-10-CM | POA: Diagnosis not present

## 2017-07-05 DIAGNOSIS — K7581 Nonalcoholic steatohepatitis (NASH): Secondary | ICD-10-CM | POA: Diagnosis not present

## 2017-07-07 ENCOUNTER — Other Ambulatory Visit: Payer: Self-pay | Admitting: Nurse Practitioner

## 2017-07-07 DIAGNOSIS — K7469 Other cirrhosis of liver: Secondary | ICD-10-CM

## 2017-07-21 IMAGING — US US ABDOMEN LIMITED
1 series · 14 of 25 positions shown · non-contrast
Comparison: 12/04/2014

CLINICAL DATA: Cirrhosis, screening for hepatoma

EXAM:
US ABDOMEN LIMITED - RIGHT UPPER QUADRANT

[Series 1: us abdomen limited · 0.33mm/px · 14 of 50 slices shown]
[im 1/50]
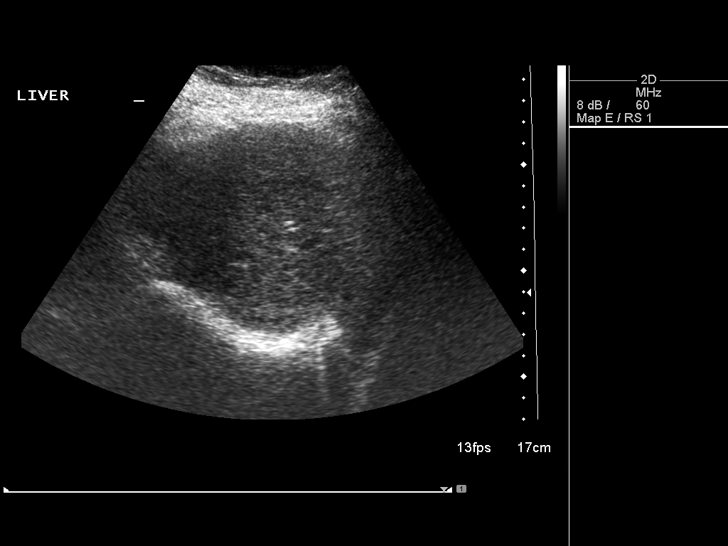
[im 5/50]
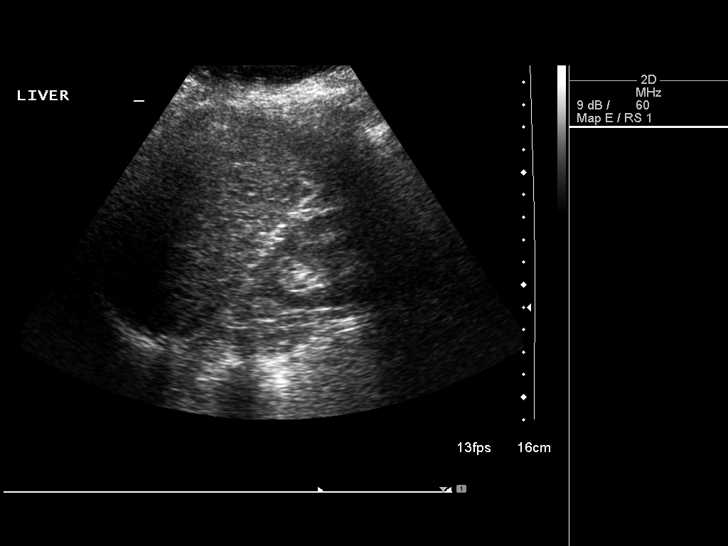
[im 9/50]
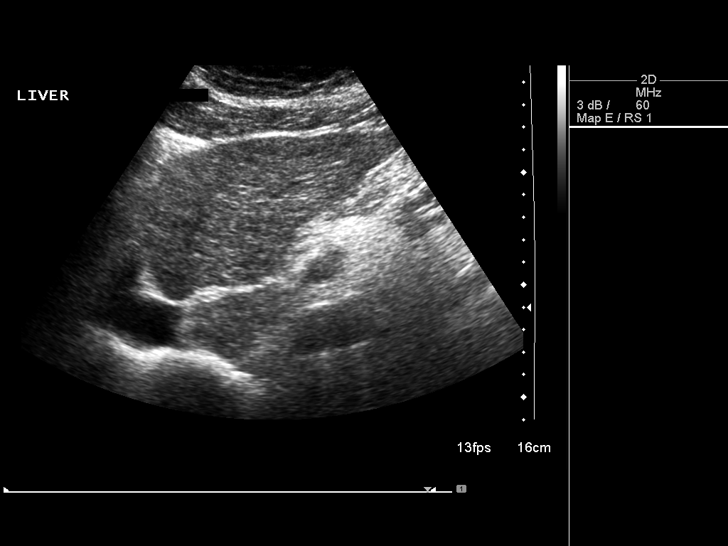
[im 13/50]
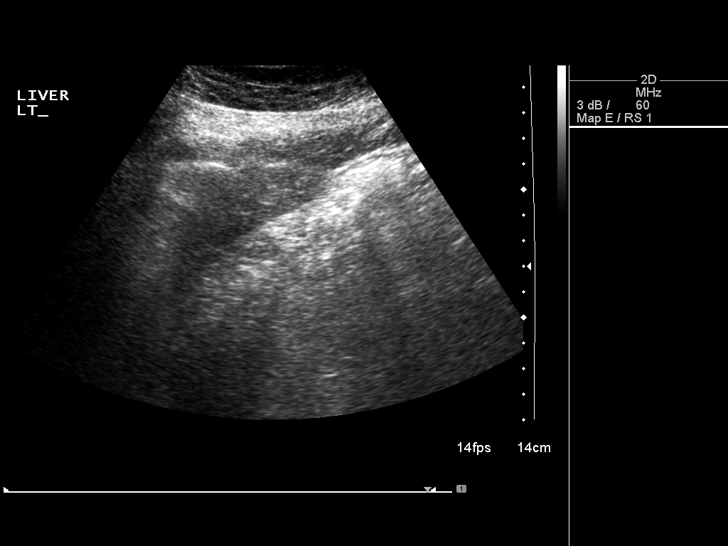
[im 17/50]
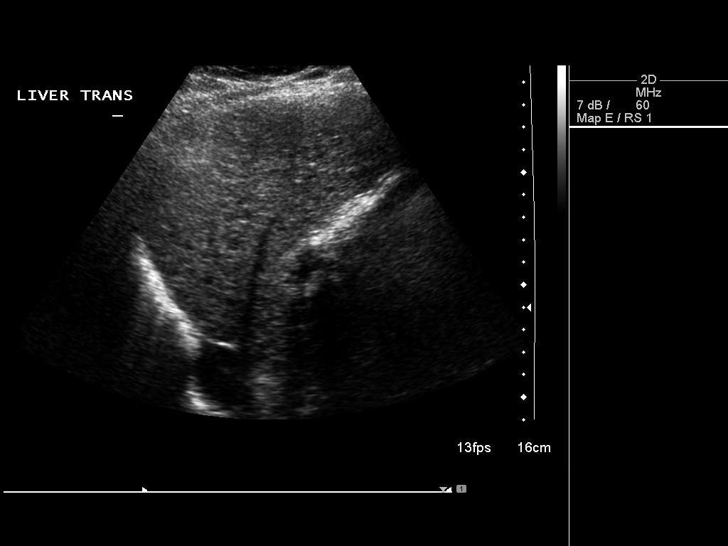
[im 19/50]
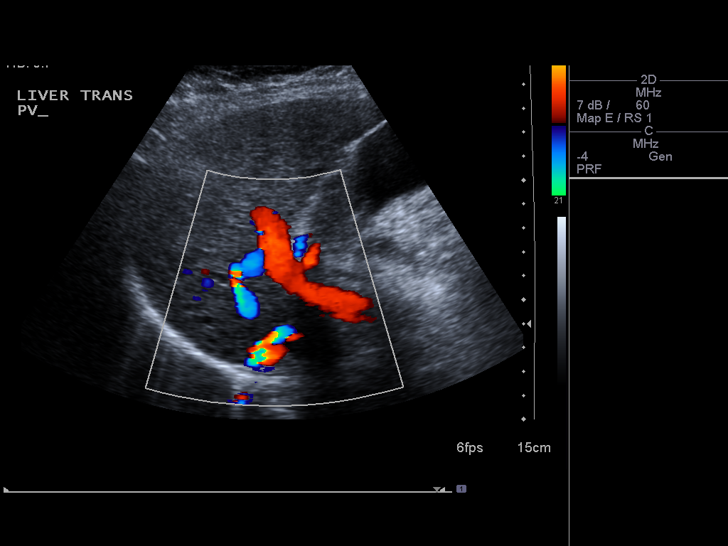
[im 23/50]
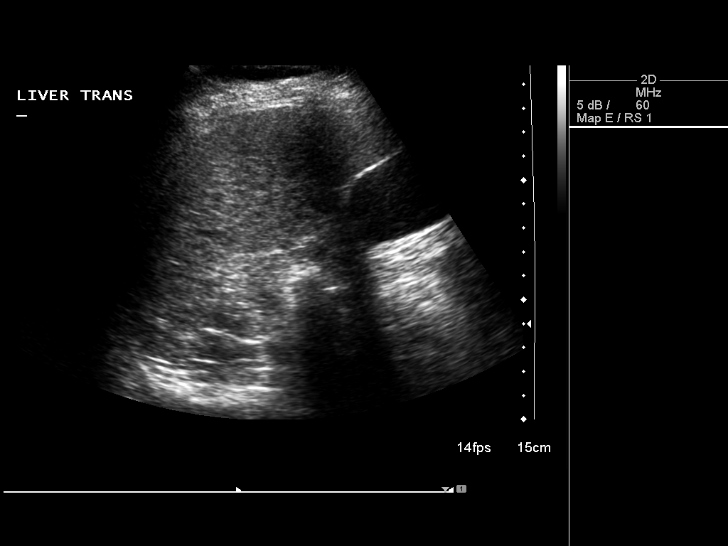
[im 27/50]
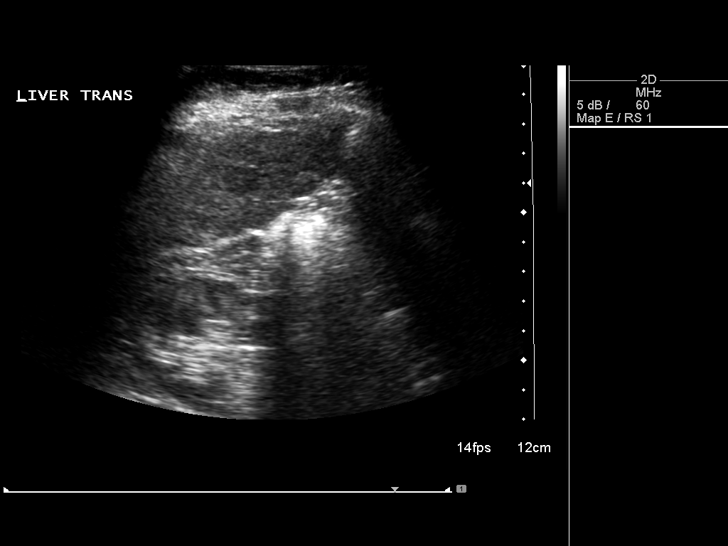
[im 31/50]
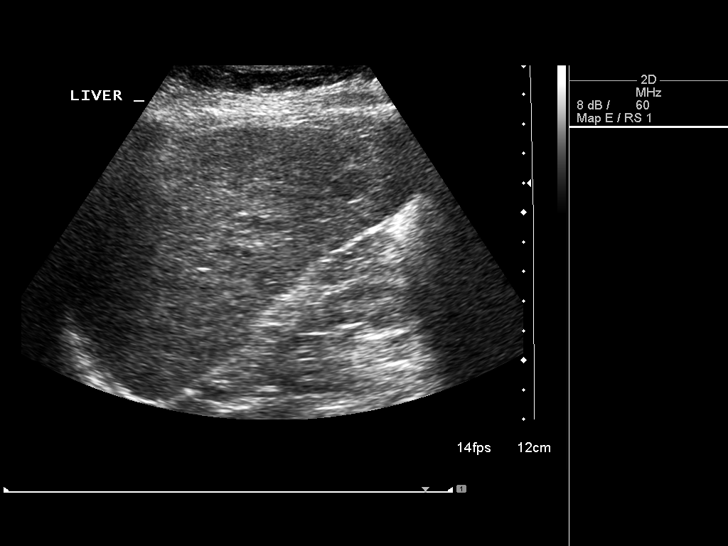
[im 33/50]
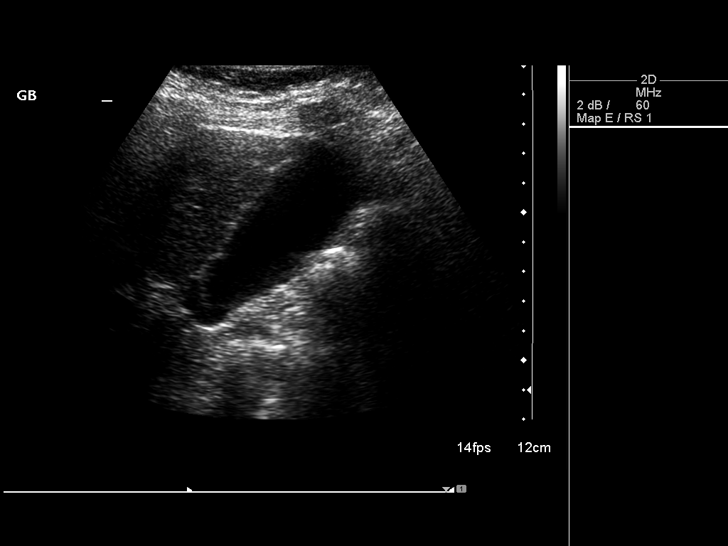
[im 37/50]
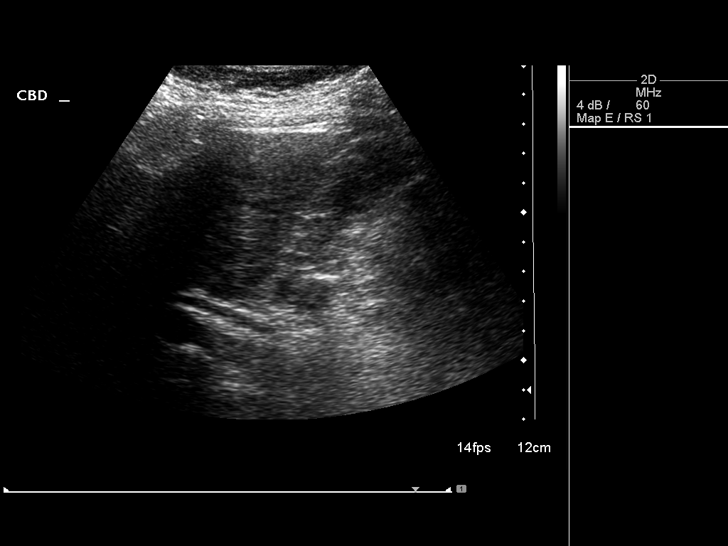
[im 41/50]
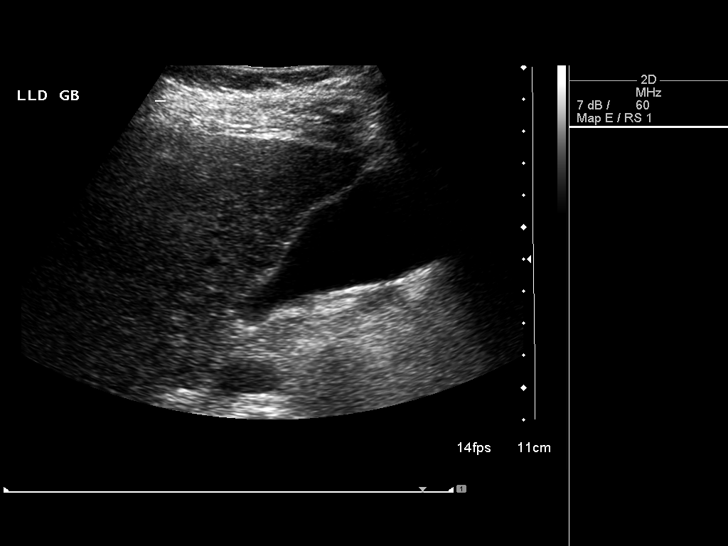
[im 45/50]
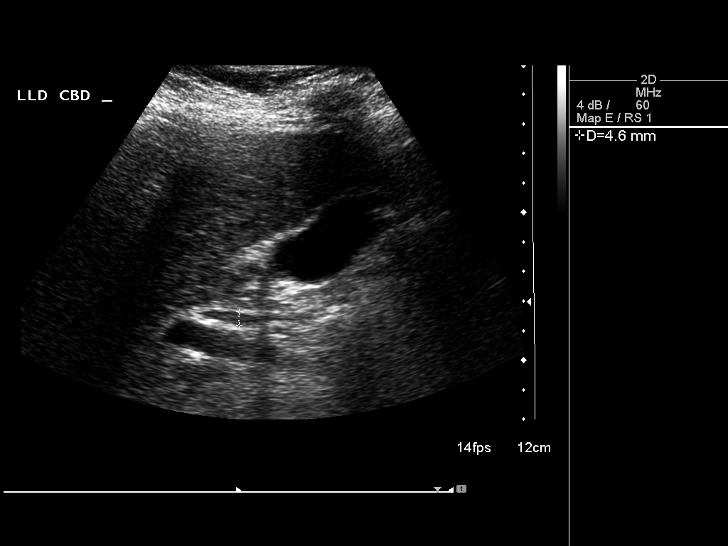
[im 50/50]
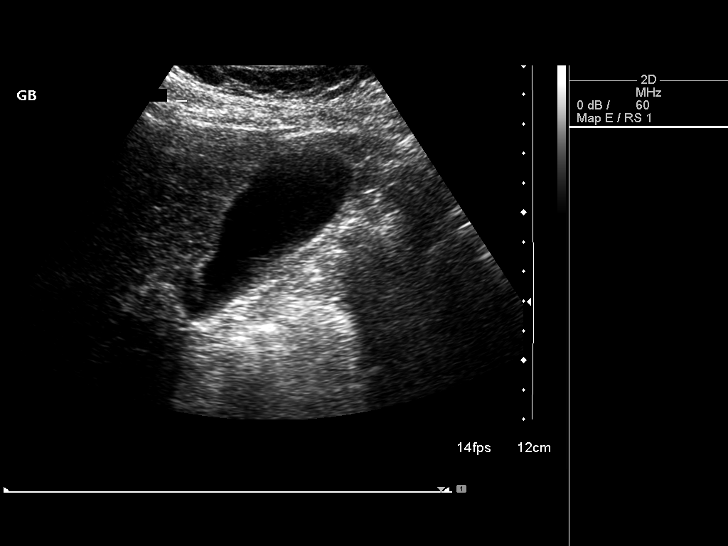

[14 of 25 positions shown; findings below may reference images not displayed]

FINDINGS: Gallbladder:

No gallstones or wall thickening visualized. No sonographic Murphy
sign noted by sonographer.

Common bile duct:

Diameter: 4.6 mm in diameter within normal limits.

Liver:

There is mild increased echogenicity of the liver with nodular
contour consistent with cirrhosis. There is poorly visualized
hypoechoic lesion in right hepatic lobe measures 1.5 x 1.3 cm.
Further correlation with enhanced MRI is recommended. No
intrahepatic biliary ductal dilatation.
IMPRESSION: 1. No gallstones are noted within gallbladder.  Normal CBD.

2. There is mild increased echogenicity of the liver with nodular
contour consistent with cirrhosis. There is poorly visualized
hypoechoic lesion in right hepatic lobe measures 1.5 x 1.3 cm.
Further correlation with enhanced MRI is recommended.

## 2017-09-07 ENCOUNTER — Ambulatory Visit
Admission: RE | Admit: 2017-09-07 | Discharge: 2017-09-07 | Disposition: A | Discharge status: Home / Self Care | Payer: Medicare Other | Source: Ambulatory Visit | Attending: Nurse Practitioner | Admitting: Nurse Practitioner

## 2017-09-07 DIAGNOSIS — K7469 Other cirrhosis of liver: Secondary | ICD-10-CM

## 2017-09-07 DIAGNOSIS — K7689 Other specified diseases of liver: Secondary | ICD-10-CM | POA: Diagnosis not present

## 2017-09-21 ENCOUNTER — Other Ambulatory Visit: Payer: Self-pay | Admitting: Nurse Practitioner

## 2017-09-21 DIAGNOSIS — K7460 Unspecified cirrhosis of liver: Secondary | ICD-10-CM

## 2017-09-21 DIAGNOSIS — K769 Liver disease, unspecified: Secondary | ICD-10-CM

## 2017-09-21 DIAGNOSIS — K7581 Nonalcoholic steatohepatitis (NASH): Principal | ICD-10-CM

## 2017-10-01 ENCOUNTER — Ambulatory Visit
Admission: RE | Admit: 2017-10-01 | Discharge: 2017-10-01 | Disposition: A | Discharge status: Home / Self Care | Payer: Medicare Other | Source: Ambulatory Visit | Attending: Nurse Practitioner | Admitting: Nurse Practitioner

## 2017-10-01 DIAGNOSIS — K7460 Unspecified cirrhosis of liver: Secondary | ICD-10-CM | POA: Diagnosis not present

## 2017-10-01 DIAGNOSIS — K7581 Nonalcoholic steatohepatitis (NASH): Principal | ICD-10-CM

## 2017-10-01 DIAGNOSIS — K769 Liver disease, unspecified: Secondary | ICD-10-CM

## 2017-10-01 DIAGNOSIS — K7469 Other cirrhosis of liver: Secondary | ICD-10-CM | POA: Diagnosis not present

## 2017-10-01 MED ORDER — GADOXETATE DISODIUM 0.25 MMOL/ML IV SOLN
8.0000 mL | Freq: Once | INTRAVENOUS | Status: AC | PRN
  Administered 2017-10-01: 8 mL via INTRAVENOUS

## 2017-10-20 ENCOUNTER — Ambulatory Visit (INDEPENDENT_AMBULATORY_CARE_PROVIDER_SITE_OTHER)
Admission: RE | Admit: 2017-10-20 | Discharge: 2017-10-20 | Disposition: A | Discharge status: Home / Self Care | Payer: Medicare Other | Source: Ambulatory Visit | Attending: Pulmonary Disease | Admitting: Pulmonary Disease

## 2017-10-20 DIAGNOSIS — R252 Cramp and spasm: Secondary | ICD-10-CM | POA: Diagnosis not present

## 2017-10-20 DIAGNOSIS — M5412 Radiculopathy, cervical region: Secondary | ICD-10-CM | POA: Diagnosis not present

## 2017-10-20 DIAGNOSIS — M5417 Radiculopathy, lumbosacral region: Secondary | ICD-10-CM | POA: Diagnosis not present

## 2017-10-20 DIAGNOSIS — G5603 Carpal tunnel syndrome, bilateral upper limbs: Secondary | ICD-10-CM | POA: Diagnosis not present

## 2017-10-20 DIAGNOSIS — J8410 Pulmonary fibrosis, unspecified: Secondary | ICD-10-CM

## 2017-10-20 DIAGNOSIS — G3184 Mild cognitive impairment, so stated: Secondary | ICD-10-CM | POA: Diagnosis not present

## 2017-11-17 DIAGNOSIS — Z6828 Body mass index (BMI) 28.0-28.9, adult: Secondary | ICD-10-CM | POA: Diagnosis not present

## 2017-11-17 DIAGNOSIS — J209 Acute bronchitis, unspecified: Secondary | ICD-10-CM | POA: Diagnosis not present

## 2017-11-17 DIAGNOSIS — J40 Bronchitis, not specified as acute or chronic: Secondary | ICD-10-CM | POA: Diagnosis not present

## 2017-11-17 DIAGNOSIS — J019 Acute sinusitis, unspecified: Secondary | ICD-10-CM | POA: Diagnosis not present

## 2017-11-23 ENCOUNTER — Ambulatory Visit: Payer: Medicare Other

## 2017-11-23 ENCOUNTER — Ambulatory Visit: Payer: Medicare Other | Admitting: Pulmonary Disease

## 2017-12-31 ENCOUNTER — Other Ambulatory Visit: Payer: Self-pay

## 2018-01-03 ENCOUNTER — Ambulatory Visit: Payer: Medicare Other

## 2018-01-03 ENCOUNTER — Ambulatory Visit: Payer: Medicare Other | Admitting: Pulmonary Disease

## 2018-01-13 ENCOUNTER — Encounter: Payer: Self-pay | Admitting: Pulmonary Disease

## 2018-01-13 ENCOUNTER — Ambulatory Visit (INDEPENDENT_AMBULATORY_CARE_PROVIDER_SITE_OTHER): Payer: Medicare Other | Admitting: Pulmonary Disease

## 2018-01-13 ENCOUNTER — Ambulatory Visit (INDEPENDENT_AMBULATORY_CARE_PROVIDER_SITE_OTHER): Payer: Medicare Other | Admitting: *Deleted

## 2018-01-13 VITALS — BP 126/68 | HR 60 | Ht 64.0 in | Wt 160.0 lb

## 2018-01-13 DIAGNOSIS — J8410 Pulmonary fibrosis, unspecified: Secondary | ICD-10-CM

## 2018-01-13 DIAGNOSIS — R0602 Shortness of breath: Secondary | ICD-10-CM

## 2018-01-13 NOTE — Patient Instructions (Signed)
We will continue to monitor your lungs I will see you back in clinic in 6 months with spirometry, diffusion capacity and 6-minute walk test

## 2018-01-13 NOTE — Progress Notes (Signed)
Heather Townsend    [id number]    March 09, 1946  Primary Care Physician:Hunter, Brayton Mars, MD  Referring Physician: Marin Olp, MD [address] [place], [place] [address] [phone] Chief complaint:   Follow up for lung fibrosis.  HPI: [name] is a 72 year old with history of Heather Townsend cirrhosis, GERD, hiatal hernia. She had an MRI of the abdomen as a part of follow-up for cirrhosis. It showed fibrosis at lung bases. She has been referred here for further follow-up. She has some mild dyspnea on exertion but no other symptoms confined to the chest. She denies any cough, wheezing, sputum production, hemoptysis. She does not have any symptoms suggestive of connective tissue, autoimmune disease. She does have history of unspecified lung fibrosis in her mother and aunt. She does not have any exposures at work to asbestos or other inhalants.  She had a thyroid ultrasound and biopsy for her nodule found on the CT chest.There is no evidence of malignancy as per the patient She has been started on synthroid for hypothyroidism but the patient stopped taking it.  Interim History:  Breathing is stable.  No dyspnea, cough, sputum production.  Outpatient Encounter Medications as of 01/13/2018  Medication Sig  . oxyCODONE-acetaminophen (PERCOCET/ROXICET) 5-325 MG per tablet Take 0.5 tablets by mouth every 8 (eight) hours as needed for moderate pain or severe pain.   . vitamin B-12 (CYANOCOBALAMIN) 1000 MCG tablet Take 1,000 mcg by mouth 3 (three) times a week.  . [DISCONTINUED] levothyroxine (SYNTHROID, LEVOTHROID) 25 MCG tablet Take 1 tablet (25 mcg total) by mouth daily before breakfast.  . [DISCONTINUED] pantoprazole (PROTONIX) 40 MG tablet Take 1 tablet (40 mg total) by mouth daily.   Facility-Administered Encounter Medications as of 01/13/2018  Medication  . 0.9 %  sodium chloride infusion    Allergies as of 01/13/2018 - Review Complete 01/13/2018  Allergen Reaction Noted  .  Celecoxib    . Codeine  09/19/2009  . Ibuprofen    . Meloxicam    . Nsaids  10/07/2015  . Rofecoxib    . Sulfonamide derivatives      Past Medical History:  Diagnosis Date  . Allergy   . Cirrhosis ([place])   . Diverticulosis   . DIVERTICULOSIS, COLON 01/03/2007  . Fatty infiltration of liver   . GERD (gastroesophageal reflux disease)   . Hepatitis, chronic active ([place])    NASH  . Hiatal hernia   . HIATAL HERNIA 12/07/2006  . Hyperlipidemia   . Low back pain     Past Surgical History:  Procedure Laterality Date  . APPENDECTOMY    . EYE SURGERY     macular hole  . ganglion cysts     left hand  . HAND SURGERY  1996  . SHOULDER SURGERY  2006   rt shoulder  . TONSILLECTOMY    . TUBAL LIGATION    . UTERINE SUSPENSION      Family History  Problem Relation Age of Onset  . Heart disease Mother   . Diabetes Mother   . Pancreatic cancer Paternal Uncle   . Colon cancer Paternal Uncle   . Stomach cancer Paternal Uncle   . Cancer Maternal Aunt        Stomach  . Esophageal cancer Maternal Aunt   . Esophageal cancer Maternal Uncle   . Rectal cancer Neg Hx     Social History   Socioeconomic History  . Marital status: Divorced    Spouse  name: Not on file  . Number of children: Not on file  . Years of education: Not on file  . Highest education level: Not on file  Occupational History  . Occupation: Glass blower/designer  Social Needs  . Financial resource strain: Not on file  . Food insecurity:    Worry: Not on file    Inability: Not on file  . Transportation needs:    Medical: Not on file    Non-medical: Not on file  Tobacco Use  . Smoking status: Former Smoker    Packs/day: 0.75    Years: 15.00    Pack years: 11.25    Types: Cigarettes    Last attempt to quit: 06/01/1986    Years since quitting: 31.6  . Smokeless tobacco: Never Used  Substance and Sexual Activity  . Alcohol use: Yes    Alcohol/week: 0.0 standard drinks    Comment: rare  . Drug use: No  . Sexual  activity: Not Currently  Lifestyle  . Physical activity:    Days per week: Not on file    Minutes per session: Not on file  . Stress: Not on file  Relationships  . Social connections:    Talks on phone: Not on file    Gets together: Not on file    Attends religious service: Not on file    Active member of club or organization: Not on file    Attends meetings of clubs or organizations: Not on file    Relationship status: Not on file  . Intimate partner violence:    Fear of current or ex partner: Not on file    Emotionally abused: Not on file    Physically abused: Not on file    Forced sexual activity: Not on file  Other Topics Concern  . Not on file  Social History Narrative   Divorced. Lives alone. 2 children (son in [place], daughter in [place]). 1 granddaughter in [place].    Works in Occupational hygienist for communities in school. Bookkeeping/purchasing, HR piece.     Review of systems: Review of Systems  Constitutional: Negative for fever and chills.  HENT: Negative.   Eyes: Negative for blurred vision.  Respiratory: as per HPI  Cardiovascular: Negative for chest pain and palpitations.  Gastrointestinal: Negative for vomiting, diarrhea, blood per rectum. Genitourinary: Negative for dysuria, urgency, frequency and hematuria.  Musculoskeletal: Negative for myalgias, back pain and joint pain.  Skin: Negative for itching and rash.  Neurological: Negative for dizziness, tremors, focal weakness, seizures and loss of consciousness.  Endo/Heme/Allergies: Negative for environmental allergies.  Psychiatric/Behavioral: Negative for depression, suicidal ideas and hallucinations.  All other systems reviewed and are negative.  Physical Exam: Blood pressure 126/68, pulse 60, height 5' 4"  (1.626 m), weight 160 lb (72.6 kg), SpO2 95 %. Gen:      No acute distress HEENT:  EOMI, sclera anicteric Neck:     No masses; no thyromegaly Lungs:    Mild basilar crackles. CV:         Regular rate  and rhythm; no murmurs Abd:      + bowel sounds; soft, non-tender; no palpable masses, no distension Ext:    No edema; adequate peripheral perfusion Skin:      Warm and dry; no rash Neuro: alert and oriented x 3 Psych: normal mood and affect  Data Reviewed: CT abdomen 12/04/14- Reticulation at bases, unchanged since 2011 CT abdomen 06/02/11- Reticulation at bases. Images reviewed. 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  NSIP fibrosis High res CT 10/01/16- ILD, fibrosis, unchanged.  High res CT 10/20/17-mild progression pulmonary fibrosis, possible honeycombing I have reviewed all images personally.  Serologies 09/05/15 CRP 0.4, sedimentation rate 43 Aldolase 11.2 P-ANCA 1:40 dsDNA- 6 Negative- Jo-1, centromere, RNP, RO, LA RA, CCP, Scl 70  PFTs 09/29/15 FVC 2.45 (80%), FEV1 2.18 (93%), F/F 89, TLC 79%, DLCO 53% Mild restriction, moderately severe diffusion defect.  Assessment:  Pulmonary fibrosis. Last CT shows mild progression with what looks like early honeycombing.  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  She does have a strong family history of pulmonary fibrosis in her mother and aunt. She does not have any exposure history or symptoms suggestive of connective tissue, autoimmune disease. Serologies are negative except for intermediate elevation in double-stranded DNA and mild elevation in ANCA of unclear significance.   We discussed again further work-up including lung biopsy but she does not want any invasive procedure.  We could consider the transbronchial biopsy with genomic veracyte test that will be available in late 2019 for evaluation of IPF.  She does not want to try anti-fibrotics since she is afraid that it could make the liver condition worse. I will see her back in clinic with repeat 6-minute walk test spirometry, diffusion capacity.  Plan/Recommendations: - Follow up in 6 months with spirometry, diffusion capacity and 6-minute walk  test.  Marshell Garfinkel MD [facility] 01/13/2018, 3:58 PM  CC: Marin Olp, MD

## 2018-01-13 NOTE — Progress Notes (Signed)
SIX MIN WALK 01/13/2018  Medications none  Supplimental Oxygen during Test? (L/min) No  Laps 11  Partial Lap (in Meters) 33  Baseline BP (sitting) 124/80  Baseline Heartrate 89  Baseline Dyspnea (Borg Scale) 0  Baseline Fatigue (Borg Scale) 0  Baseline SPO2 96  BP (sitting) 152/80  Heartrate 131  Dyspnea (Borg Scale) 1  Fatigue (Borg Scale) 1  SPO2 88  BP (sitting) 138/80  Heartrate 95  SPO2 95  Stopped or Paused before Six Minutes No  Distance Completed 561

## 2018-01-17 ENCOUNTER — Ambulatory Visit (INDEPENDENT_AMBULATORY_CARE_PROVIDER_SITE_OTHER): Payer: Medicare Other | Admitting: Family Medicine

## 2018-01-17 ENCOUNTER — Encounter: Payer: Self-pay | Admitting: Family Medicine

## 2018-01-17 VITALS — BP 120/80 | HR 92 | Temp 98.0°F | Ht 64.0 in | Wt 158.0 lb

## 2018-01-17 DIAGNOSIS — E785 Hyperlipidemia, unspecified: Secondary | ICD-10-CM

## 2018-01-17 DIAGNOSIS — E119 Type 2 diabetes mellitus without complications: Secondary | ICD-10-CM

## 2018-01-17 DIAGNOSIS — E041 Nontoxic single thyroid nodule: Secondary | ICD-10-CM

## 2018-01-17 DIAGNOSIS — E538 Deficiency of other specified B group vitamins: Secondary | ICD-10-CM | POA: Diagnosis not present

## 2018-01-17 NOTE — Assessment & Plan Note (Signed)
S:  thyroid ultrasound led to eventual biopsy 12/19/2015 for left sided nodule. No caner was round A/P: we will check tsh, t3, t4 on patient today

## 2018-01-17 NOTE — Assessment & Plan Note (Signed)
S: poorly controlled in the past on no rx. Has refused statin- she is concerned about her liver Lab Results  Component Value Date   CHOL 182 05/02/2015   HDL 41.70 05/02/2015   LDLCALC 122 (H) 05/02/2015   LDLDIRECT 148.3 11/28/2012   TRIG 93.0 05/02/2015   CHOLHDL 4 05/02/2015   A/P: will update LFTs today- if LDL remains above 70- will reach out to [facility] liver care to see her opinion on statins

## 2018-01-17 NOTE — Assessment & Plan Note (Signed)
S: history of low b12- injects twice a month- thinks it actually stretches to 3 weeks- last one 12 days ago A/P: if b12 is high- we could consider going to every 3-4 weeks.

## 2018-01-17 NOTE — Progress Notes (Signed)
Subjective:  Heather Townsend is a 72 y.o. year old very pleasant female patient who presents for/with See problem oriented charting ROS- stable shortness of breath. No hypoglycemia. No chest pain reported. No unintentional weight gain, no enlargement of nodule in left neck  Past Medical History-  Patient Active Problem List   Diagnosis Date Noted  . Shortness of breath 08/16/2015    Priority: High  . Type 2 diabetes mellitus ([place]) 03/08/2014    Priority: High  . NASH (nonalcoholic steatohepatitis) 04/04/2013    Priority: High  . Left thyroid nodule 11/06/2015    Priority: Medium  . Cervical disc disease 04/16/2014    Priority: Medium  . GERD 09/05/2007    Priority: Medium  . Dyslipidemia 08/16/2007    Priority: Medium  . Vitamin B12 deficiency 01/17/2018    Priority: Low  . Osteopenia 03/07/2014    Priority: Low  . Rash, skin 01/25/2013    Priority: Low  . Chest pain 05/11/2012    Priority: Low  . Renal calculus or stone 04/27/2011    Priority: Low    Medications- reviewed and updated Current Outpatient Medications  Medication Sig Dispense Refill  . Cyanocobalamin 1000 MCG/ML KIT Inject 1,000 mg as directed. Pt does self injections twice a month.    . oxyCODONE-acetaminophen (PERCOCET/ROXICET) 5-325 MG per tablet Take 0.5 tablets by mouth every 8 (eight) hours as needed for moderate pain or severe pain.      Objective: BP 120/80 (BP Location: Left Arm, Patient Position: Sitting, Cuff Size: Normal)   Pulse 92   Temp 98 F (36.7 C) (Oral)   Ht _0  (1.626 m)   Wt 158 lb (71.7 kg)   SpO2 96%   BMI 27.12 kg/m  Gen: NAD, resting comfortably, appears well CV: RRR no murmurs rubs or gallops Lungs:crackles noted to bilateral lung fields Abdomen: soft/nontender/nondistended/overweight Ext: no edema Skin: warm, dry  Assessment/Plan:  Type 2 diabetes mellitus (HCC) S: diet controlled in the past. Has not wanted to use metformin. Target a1c at least under 7.5 but Dawn  Drazek of [facility] liver care prefers a1c closer to 6 Lab Results  Component Value Date   HGBA1C 7.1 (H) 05/02/2015   HGBA1C 6.5 08/02/2014   HGBA1C 7.3 (H) 03/07/2014   A/P: check a1c and urine microalbumin today. I discussed with patient at the latest we should have 6 month visits given her history of DM  Dyslipidemia S: poorly controlled in the past on no rx. Has refused statin- she is concerned about her liver Lab Results  Component Value Date   CHOL 182 05/02/2015   HDL 41.70 05/02/2015   LDLCALC 122 (H) 05/02/2015   LDLDIRECT 148.3 11/28/2012   TRIG 93.0 05/02/2015   CHOLHDL 4 05/02/2015   A/P: will update LFTs today- if LDL remains above 70- will reach out to [facility] care to see her opinion on statins  Vitamin B12 deficiency S: history of low b12- injects twice a month- thinks it actually stretches to 3 weeks- last one 12 days ago A/P: if b12 is high- we could consider going to every 3-4 weeks.   Left thyroid nodule S:  thyroid ultrasound led to eventual biopsy 12/19/2015 for left sided nodule. No caner was round A/P: we will check tsh, t3, t4 on patient today  Future Appointments  Date Time Provider [place]  07/22/2018  3:30 PM Marin Olp, MD [facility]   Return in about 6 months (around 07/20/2018) for  follow up- or sooner if needed.  Lab/Order associations: Type 2 diabetes mellitus without complication, without long-term current use of insulin (HCC) - Plan: CBC, Comprehensive metabolic panel, Lipid panel, Hemoglobin A1c, Microalbumin / creatinine urine ratio  Left thyroid nodule - Plan: TSH, T4, free, T3, free  Dyslipidemia  Vitamin B12 deficiency  Return precautions advised.  Garret Reddish, MD

## 2018-01-17 NOTE — Assessment & Plan Note (Signed)
S: diet controlled in the past. Has not wanted to use metformin. Target a1c at least under 7.5 but [name] of [facility] prefers a1c closer to 6 Lab Results  Component Value Date   HGBA1C 7.1 (H) 05/02/2015   HGBA1C 6.5 08/02/2014   HGBA1C 7.3 (H) 03/07/2014   A/P: check a1c and urine microalbumin today. I discussed with patient at the latest we should have 6 month visits given her history of DM

## 2018-01-17 NOTE — Patient Instructions (Addendum)
Please stop by lab before you go  No changes today as long as a1c at least below 7.5  May see you sooner if a1c higher

## 2018-01-18 LAB — TSH: TSH: 3.02 u[IU]/mL (ref 0.35–4.50)

## 2018-01-18 LAB — COMPREHENSIVE METABOLIC PANEL
ALT: 83 U/L — AB (ref 0–35)
AST: 83 U/L — AB (ref 0–37)
Albumin: 4.2 g/dL (ref 3.5–5.2)
Alkaline Phosphatase: 97 U/L (ref 39–117)
BILIRUBIN TOTAL: 0.8 mg/dL (ref 0.2–1.2)
BUN: 14 mg/dL (ref 6–23)
CHLORIDE: 99 meq/L (ref 96–112)
CO2: 30 mEq/L (ref 19–32)
CREATININE: 0.83 mg/dL (ref 0.40–1.20)
Calcium: 10.2 mg/dL (ref 8.4–10.5)
GFR: 72.14 mL/min (ref >60.00)
GLUCOSE: 131 mg/dL — AB (ref 70–99)
Potassium: 3.8 mEq/L (ref 3.5–5.1)
Sodium: 140 mEq/L (ref 135–145)
Total Protein: 8.3 g/dL (ref 6.0–8.3)

## 2018-01-18 LAB — CBC
HCT: 48.2 % — ABNORMAL HIGH (ref 36.0–46.0)
Hemoglobin: 16.2 g/dL — ABNORMAL HIGH (ref 12.0–15.0)
MCHC: 33.6 g/dL (ref 30.0–36.0)
MCV: 93.6 fl (ref 78.0–100.0)
Platelets: 132 10*3/uL — ABNORMAL LOW (ref 150.0–400.0)
RBC: 5.15 Mil/uL — ABNORMAL HIGH (ref 3.87–5.11)
RDW: 13.9 % (ref 11.5–15.5)
WBC: 7.2 10*3/uL (ref 4.0–10.5)

## 2018-01-18 LAB — T3, FREE: T3 FREE: 3.4 pg/mL (ref 2.3–4.2)

## 2018-01-18 LAB — LIPID PANEL
CHOL/HDL RATIO: 4
Cholesterol: 182 mg/dL (ref 0–200)
HDL: 46.5 mg/dL (ref >39.00)
LDL CALC: 111 mg/dL — AB (ref 0–99)
NONHDL: 135.02
TRIGLYCERIDES: 121 mg/dL (ref 0.0–149.0)
VLDL: 24.2 mg/dL (ref 0.0–40.0)

## 2018-01-18 LAB — MICROALBUMIN / CREATININE URINE RATIO
CREATININE, U: 253.3 mg/dL
Microalb Creat Ratio: 3.3 mg/g (ref 0.0–30.0)
Microalb, Ur: 8.3 mg/dL — ABNORMAL HIGH (ref 0.0–1.9)

## 2018-01-18 LAB — T4, FREE: FREE T4: 0.68 ng/dL (ref 0.60–1.60)

## 2018-01-18 LAB — HEMOGLOBIN A1C: Hgb A1c MFr Bld: 7.9 % — ABNORMAL HIGH (ref 4.6–6.5)

## 2018-04-06 DIAGNOSIS — K7689 Other specified diseases of liver: Secondary | ICD-10-CM | POA: Diagnosis not present

## 2018-04-06 DIAGNOSIS — K7581 Nonalcoholic steatohepatitis (NASH): Secondary | ICD-10-CM | POA: Diagnosis not present

## 2018-04-07 ENCOUNTER — Other Ambulatory Visit: Payer: Self-pay | Admitting: Nurse Practitioner

## 2018-04-07 DIAGNOSIS — IMO0001 Reserved for inherently not codable concepts without codable children: Secondary | ICD-10-CM

## 2018-04-07 DIAGNOSIS — K7689 Other specified diseases of liver: Secondary | ICD-10-CM

## 2018-04-07 DIAGNOSIS — K7581 Nonalcoholic steatohepatitis (NASH): Secondary | ICD-10-CM

## 2018-06-05 ENCOUNTER — Encounter: Payer: Self-pay | Admitting: Gastroenterology

## 2018-06-06 ENCOUNTER — Ambulatory Visit
Admission: RE | Admit: 2018-06-06 | Discharge: 2018-06-06 | Disposition: A | Discharge status: Home / Self Care | Payer: Medicare Other | Source: Ambulatory Visit | Attending: Nurse Practitioner | Admitting: Nurse Practitioner

## 2018-06-06 DIAGNOSIS — K7581 Nonalcoholic steatohepatitis (NASH): Secondary | ICD-10-CM

## 2018-06-06 DIAGNOSIS — K7689 Other specified diseases of liver: Secondary | ICD-10-CM

## 2018-06-06 DIAGNOSIS — IMO0001 Reserved for inherently not codable concepts without codable children: Secondary | ICD-10-CM

## 2018-06-06 MED ORDER — GADOXETATE DISODIUM 0.25 MMOL/ML IV SOLN
7.0000 mL | Freq: Once | INTRAVENOUS | Status: AC | PRN
  Administered 2018-06-06: 7 mL via INTRAVENOUS

## 2018-06-13 LAB — HM DIABETES EYE EXAM

## 2018-07-22 ENCOUNTER — Ambulatory Visit: Payer: Medicare Other | Admitting: Family Medicine

## 2018-08-13 ENCOUNTER — Emergency Department (HOSPITAL_COMMUNITY)
Admission: EM | Admit: 2018-08-13 | Discharge: 2018-08-14 | Disposition: A | Discharge status: Home / Self Care | Payer: Medicare Other | Attending: Emergency Medicine | Admitting: Emergency Medicine

## 2018-08-13 ENCOUNTER — Encounter (HOSPITAL_COMMUNITY): Payer: Self-pay

## 2018-08-13 ENCOUNTER — Other Ambulatory Visit: Payer: Self-pay

## 2018-08-13 ENCOUNTER — Emergency Department (HOSPITAL_COMMUNITY): Payer: Medicare Other

## 2018-08-13 DIAGNOSIS — S93601A Unspecified sprain of right foot, initial encounter: Secondary | ICD-10-CM | POA: Insufficient documentation

## 2018-08-13 DIAGNOSIS — W1789XA Other fall from one level to another, initial encounter: Secondary | ICD-10-CM | POA: Insufficient documentation

## 2018-08-13 DIAGNOSIS — Y939 Activity, unspecified: Secondary | ICD-10-CM | POA: Diagnosis not present

## 2018-08-13 DIAGNOSIS — Y929 Unspecified place or not applicable: Secondary | ICD-10-CM | POA: Insufficient documentation

## 2018-08-13 DIAGNOSIS — Z79899 Other long term (current) drug therapy: Secondary | ICD-10-CM | POA: Diagnosis not present

## 2018-08-13 DIAGNOSIS — E119 Type 2 diabetes mellitus without complications: Secondary | ICD-10-CM | POA: Diagnosis not present

## 2018-08-13 DIAGNOSIS — S8391XA Sprain of unspecified site of right knee, initial encounter: Secondary | ICD-10-CM

## 2018-08-13 DIAGNOSIS — Y9301 Activity, walking, marching and hiking: Secondary | ICD-10-CM | POA: Insufficient documentation

## 2018-08-13 DIAGNOSIS — S298XXA Other specified injuries of thorax, initial encounter: Secondary | ICD-10-CM | POA: Insufficient documentation

## 2018-08-13 DIAGNOSIS — S199XXA Unspecified injury of neck, initial encounter: Secondary | ICD-10-CM | POA: Diagnosis present

## 2018-08-13 DIAGNOSIS — Z87891 Personal history of nicotine dependence: Secondary | ICD-10-CM | POA: Diagnosis not present

## 2018-08-13 DIAGNOSIS — Y999 Unspecified external cause status: Secondary | ICD-10-CM | POA: Insufficient documentation

## 2018-08-13 DIAGNOSIS — S1093XA Contusion of unspecified part of neck, initial encounter: Secondary | ICD-10-CM | POA: Diagnosis not present

## 2018-08-13 DIAGNOSIS — W19XXXA Unspecified fall, initial encounter: Secondary | ICD-10-CM

## 2018-08-13 MED ORDER — ACETAMINOPHEN 325 MG PO TABS
650.0000 mg | ORAL_TABLET | Freq: Once | ORAL | Status: AC
  Administered 2018-08-13: 650 mg via ORAL
  Filled 2018-08-13: qty 2

## 2018-08-13 NOTE — ED Triage Notes (Signed)
Fell while mowing and hit right side of neck with cut to right neck noted and right sided chest pain worse with movement no LOC voiced.

## 2018-08-14 ENCOUNTER — Emergency Department (HOSPITAL_COMMUNITY): Payer: Medicare Other

## 2018-08-14 ENCOUNTER — Encounter (HOSPITAL_COMMUNITY): Payer: Self-pay

## 2018-08-14 DIAGNOSIS — S1093XA Contusion of unspecified part of neck, initial encounter: Secondary | ICD-10-CM | POA: Diagnosis not present

## 2018-08-14 LAB — CBC WITH DIFFERENTIAL/PLATELET
Abs Immature Granulocytes: 0.01 10*3/uL (ref 0.00–0.07)
BASOS PCT: 0 %
Basophils Absolute: 0 10*3/uL (ref 0.0–0.1)
EOS ABS: 0.2 10*3/uL (ref 0.0–0.5)
Eosinophils Relative: 3 %
HCT: 44.5 % (ref 36.0–46.0)
Hemoglobin: 15 g/dL (ref 12.0–15.0)
Immature Granulocytes: 0 %
Lymphocytes Relative: 26 %
Lymphs Abs: 1.6 10*3/uL (ref 0.7–4.0)
MCH: 31.7 pg (ref 26.0–34.0)
MCHC: 33.7 g/dL (ref 30.0–36.0)
MCV: 94.1 fL (ref 80.0–100.0)
Monocytes Absolute: 0.5 10*3/uL (ref 0.1–1.0)
Monocytes Relative: 8 %
NEUTROS ABS: 3.9 10*3/uL (ref 1.7–7.7)
Neutrophils Relative %: 63 %
PLATELETS: 108 10*3/uL — AB (ref 150–400)
RBC: 4.73 MIL/uL (ref 3.87–5.11)
RDW: 13.2 % (ref 11.5–15.5)
WBC: 6.3 10*3/uL (ref 4.0–10.5)
nRBC: 0 % (ref 0.0–0.2)

## 2018-08-14 LAB — BASIC METABOLIC PANEL
ANION GAP: 10 (ref 5–15)
BUN: 12 mg/dL (ref 8–23)
CO2: 23 mmol/L (ref 22–32)
Calcium: 9.1 mg/dL (ref 8.9–10.3)
Chloride: 104 mmol/L (ref 98–111)
Creatinine, Ser: 0.71 mg/dL (ref 0.44–1.00)
GFR calc Af Amer: >60 mL/min (ref >60)
Glucose, Bld: 280 mg/dL — ABNORMAL HIGH (ref 70–99)
POTASSIUM: 3.6 mmol/L (ref 3.5–5.1)
SODIUM: 137 mmol/L (ref 135–145)

## 2018-08-14 MED ORDER — SODIUM CHLORIDE (PF) 0.9 % IJ SOLN
INTRAMUSCULAR | Status: AC
  Filled 2018-08-14: qty 50

## 2018-08-14 MED ORDER — IOPAMIDOL (ISOVUE-300) INJECTION 61%
INTRAVENOUS | Status: AC
  Filled 2018-08-14: qty 100

## 2018-08-14 MED ORDER — IOPAMIDOL (ISOVUE-300) INJECTION 61%
100.0000 mL | Freq: Once | INTRAVENOUS | Status: AC | PRN
  Administered 2018-08-14: 100 mL via INTRAVENOUS

## 2018-08-14 NOTE — ED Notes (Signed)
Pt resting in bed. Given incentive spirometer for home use. No acute distress

## 2018-08-14 NOTE — ED Provider Notes (Signed)
[facility] DEPT Provider Note   CSN: [id number] Arrival date & time: 08/13/18  2026    History   Chief Complaint Chief Complaint  Patient presents with   Fall    HPI Heather Townsend is a 73 y.o. female.     The history is provided by the patient.  Fall  This is a new problem. The current episode started 6 to 12 hours ago. The problem occurs constantly. The problem has not changed since onset.Pertinent negatives include no headaches. Associated symptoms comments: Chest wall pain, neck pain. Exacerbated by: Movement and breathing. Nothing relieves the symptoms.   Patient presents after accidental fall from lawnmower. Patient reports she was riding her lawnmower when it tipped over and she hit the right side of her neck and chest on a chair. No LOC.  She was able to walk immediately.  She reports pain in her lower neck and her chest No abdominal pain.  No vomiting. Past Medical History:  Diagnosis Date   Allergy    Cirrhosis ([place])    Diverticulosis    DIVERTICULOSIS, COLON 01/03/2007   Fatty infiltration of liver    GERD (gastroesophageal reflux disease)    Hepatitis, chronic active ([place])    NASH   Hiatal hernia    HIATAL HERNIA 12/07/2006   Hyperlipidemia    Low back pain     Patient Active Problem List   Diagnosis Date Noted   Vitamin B12 deficiency 01/17/2018   Left thyroid nodule 11/06/2015   Shortness of breath 08/16/2015   Cervical disc disease 04/16/2014   Type 2 diabetes mellitus ([place]) 03/08/2014   Osteopenia 03/07/2014   NASH (nonalcoholic steatohepatitis) 04/04/2013   Rash, skin 01/25/2013   Chest pain 05/11/2012   Renal calculus or stone 04/27/2011   GERD 09/05/2007   Dyslipidemia 08/16/2007    Past Surgical History:  Procedure Laterality Date   APPENDECTOMY     EYE SURGERY     macular hole   ganglion cysts     left hand   HAND [place]  2006   rt shoulder    TONSILLECTOMY     TUBAL LIGATION     UTERINE SUSPENSION       OB History   No obstetric history on file.      Home Medications    Prior to Admission medications   Medication Sig Start Date End Date Taking? Authorizing Provider  Cyanocobalamin 1000 MCG/ML KIT Inject 1,000 mg as directed. Pt does self injections twice a month.    [provider]  oxyCODONE-acetaminophen (PERCOCET/ROXICET) 5-325 MG per tablet Take 0.5 tablets by mouth every 8 (eight) hours as needed for moderate pain or severe pain.     [provider]    Family History Family History  Problem Relation Age of Onset   Heart disease Mother    Diabetes Mother    Pancreatic cancer Paternal Uncle    Colon cancer Paternal Uncle    Stomach cancer Paternal Uncle    Cancer Maternal Aunt        Stomach   Esophageal cancer Maternal Aunt    Esophageal cancer Maternal Uncle    Rectal cancer Neg Hx     Social History Social History   Tobacco Use   Smoking status: Former Smoker    Packs/day: 0.75    Years: 15.00    Pack years: 11.25    Types: Cigarettes    Last attempt to quit: 06/01/1986  Years since quitting: 32.2   Smokeless tobacco: Never Used  Substance Use Topics   Alcohol use: Yes    Alcohol/week: 0.0 standard drinks    Comment: rare   Drug use: No     Allergies   Celecoxib; Codeine; Ibuprofen; Meloxicam; Nsaids; Rofecoxib; and Sulfonamide derivatives   Review of Systems Review of Systems  Constitutional: Negative for fever.  HENT: Negative for trouble swallowing.   Respiratory: Negative for cough.   Gastrointestinal: Negative for vomiting.  Musculoskeletal: Positive for neck pain.  Neurological: Negative for headaches.  All other systems reviewed and are negative.    Physical Exam Updated Vital Signs BP (!) 167/91 (BP Location: Left Arm)    Pulse 87    Temp 98.9 F (37.2 C) (Oral)    Resp 16    Ht 1.626 m (5' 4" )    Wt 72.6 kg    SpO2 97%    BMI 27.46  kg/m   Physical Exam CONSTITUTIONAL: Well developed/well nourished HEAD: Normocephalic/atraumatic EYES: EOMI/PERRL ENMT: Mucous membranes moist, no stridor, no dysphonia NECK: supple no meningeal signs abrasion noted to lower neck just above the right clavicle with exquisite tenderness.  There is localized swelling but no crepitus SPINE/BACK:entire spine nontender, nexus criteria met CV: S1/S2 noted, no murmurs/rubs/gallops noted LUNGS: Lungs are clear to auscultation bilaterally, no apparent distress Chest-tenderness noted to right lateral chest without crepitus ABDOMEN: soft, nontender, no rebound or guarding, bowel sounds noted throughout abdomen, no RUQ tenderness GU:no cva tenderness NEURO: Pt is awake/alert/appropriate, moves all extremitiesx4.  No facial droop.  Walks without difficulty EXTREMITIES: pulses normal/equal, full ROM Bruising noted right ankle without tenderness. All other extremities/joints palpated/ranged and nontender SKIN: warm, color normal PSYCH: no abnormalities of mood noted, alert and oriented to situation   ED Treatments / Results  Labs (all labs ordered are listed, but only abnormal results are displayed) Labs Reviewed  BASIC METABOLIC PANEL - Abnormal; Notable for the following components:      Result Value   Glucose, Bld 280 (*)    All other components within normal limits  CBC WITH DIFFERENTIAL/PLATELET - Abnormal; Notable for the following components:   Platelets 108 (*)    All other components within normal limits    EKG None  Radiology Dg Ribs Unilateral W/chest Right  Result Date: 08/14/2018 CLINICAL DATA:  Fall, painful ribs EXAM: RIGHT RIBS AND CHEST - 3+ VIEW COMPARISON:  CT 10/20/2017, chest x-ray 03/16/2014 FINDINGS: Single-view chest demonstrates left greater than right peripheral ground-glass opacity and reticular change consistent with fibrosis. No acute consolidation or effusion. Normal heart size. No pneumothorax. Old fracture  deformity of the mid right clavicle. Right rib series demonstrates no acute displaced right rib fracture. IMPRESSION: 1. Negative for pneumothorax or pleural effusion. 2. No acute displaced right rib fracture 3. Reticular and ground-glass opacity consistent with fibrosis Electronically Signed   By: Donavan Foil M.D.   On: 08/14/2018 00:27   Dg Ankle Complete Right  Result Date: 08/14/2018 CLINICAL DATA:  Right ankle pain after a fall. EXAM: RIGHT ANKLE - COMPLETE 3+ VIEW COMPARISON:  None. FINDINGS: Non corticated bone fragment inferior to the cuboidal bone on the lateral view probably represents an avulsion fragment. Right ankle appears otherwise intact. No destructive or expansile bone lesions. Soft tissues are unremarkable. IMPRESSION: Probable avulsion fragment inferior to the cuboidal bone. Electronically Signed   By: Lucienne Capers M.D.   On: 08/14/2018 01:06   Ct Soft Tissue Neck W Contrast  Result Date: 08/14/2018  CLINICAL DATA:  Neck contusion EXAM: CT NECK WITH CONTRAST TECHNIQUE: Multidetector CT imaging of the neck was performed using the standard protocol following the bolus administration of intravenous contrast. CONTRAST:  166m ISOVUE-300 IOPAMIDOL (ISOVUE-300) INJECTION 61% COMPARISON:  None. FINDINGS: PHARYNX AND LARYNX: --Nasopharynx: Fossae of Rosenmuller are clear. Normal adenoid tonsils for age. --Oral cavity and oropharynx: The palatine and lingual tonsils are normal. The visible oral cavity and floor of mouth are normal. --Hypopharynx: Normal vallecula and pyriform sinuses. --Larynx: Normal epiglottis and pre-epiglottic space. Normal aryepiglottic and vocal folds. --Retropharyngeal space: No abscess, effusion or lymphadenopathy. SALIVARY GLANDS: --Parotid: No mass lesion or inflammation. No sialolithiasis or ductal dilatation. --Submandibular: Symmetric without inflammation. No sialolithiasis or ductal dilatation. --Sublingual: Normal. No ranula or other visible lesion of the base  of tongue and floor of mouth. THYROID: Normal. LYMPH NODES: No enlarged or abnormal density lymph nodes. VASCULAR: Major cervical vessels are patent. LIMITED INTRACRANIAL: Normal. VISUALIZED ORBITS: Normal. MASTOIDS AND VISUALIZED PARANASAL SINUSES: No fluid levels or advanced mucosal thickening. No mastoid effusion. SKELETON: No bony spinal canal stenosis. No lytic or blastic lesions. UPPER CHEST: Clear. OTHER: None. IMPRESSION: No acute abnormality of the neck. Electronically Signed   By: KUlyses JarredM.D.   On: 08/14/2018 00:49   Dg Knee Complete 4 Views Right  Result Date: 08/14/2018 CLINICAL DATA:  Right knee and right ankle pain after a fall. EXAM: RIGHT KNEE - COMPLETE 4+ VIEW COMPARISON:  None. FINDINGS: No evidence of fracture, dislocation, or joint effusion. No evidence of arthropathy or other focal bone abnormality. Soft tissues are unremarkable. IMPRESSION: Negative. Electronically Signed   By: WLucienne CapersM.D.   On: 08/14/2018 01:04    Procedures Procedures (including critical care time)  Medications Ordered in ED Medications  acetaminophen (TYLENOL) tablet 650 mg (650 mg Oral Given 08/13/18 2345)     Initial Impression / Assessment and Plan / ED Course  I have reviewed the triage vital signs and the nursing notes.  Pertinent labs & imaging results that were available during my care of the patient were reviewed by me and considered in my medical decision making (see chart for details).        12:03 AM Presents after accidental fall.  She has localized pain and swelling to the neck region just above the right clavicle. Will need CT neck to define any injury.  Also obtain chest x-ray due to chest wall tenderness.   2:45 AM Patient informed of all imaging results.  There was no signs of any acute traumatic neck injury.  She had no cervical spine tenderness.  On chest x-ray there is no signs of PTX or HTX.  Could have occult rib fracture, but none seen on x-ray.  She is  in no acute distress, no hypoxia. No signs of abdominal trauma.  No signs of head trauma. She was walking around the ER in no distress.  She had minimal tenderness to her foot.  She reports there was no pain in her foot with walking.  I advised her of the possible avulsion fracture.  She declines any splinting at this time.  Discussed need for follow-up as an outpatient.  We discussed strict return precautions, including any fever, increased cough or shortness of breath that could be signs of pneumonia after chest injury Final Clinical Impressions(s) / ED Diagnoses   Final diagnoses:  Fall, initial encounter  Blunt trauma to chest, initial encounter  Contusion of neck, initial encounter  Sprain of right knee, unspecified  ligament, initial encounter  Sprain of right foot, initial encounter    ED Discharge Orders    None       Ripley Fraise, MD 08/14/18 [phone]

## 2018-12-21 ENCOUNTER — Encounter: Payer: Self-pay | Admitting: Gastroenterology

## 2019-01-13 ENCOUNTER — Emergency Department (HOSPITAL_COMMUNITY)
Admission: EM | Admit: 2019-01-13 | Discharge: 2019-01-14 | Disposition: A | Discharge status: Home / Self Care | Payer: Medicare Other | Attending: Emergency Medicine | Admitting: Emergency Medicine

## 2019-01-13 ENCOUNTER — Encounter (HOSPITAL_COMMUNITY): Payer: Self-pay

## 2019-01-13 ENCOUNTER — Telehealth: Payer: Self-pay

## 2019-01-13 ENCOUNTER — Other Ambulatory Visit: Payer: Self-pay

## 2019-01-13 DIAGNOSIS — E041 Nontoxic single thyroid nodule: Secondary | ICD-10-CM | POA: Diagnosis not present

## 2019-01-13 DIAGNOSIS — Z7982 Long term (current) use of aspirin: Secondary | ICD-10-CM | POA: Diagnosis not present

## 2019-01-13 DIAGNOSIS — K7581 Nonalcoholic steatohepatitis (NASH): Secondary | ICD-10-CM | POA: Diagnosis not present

## 2019-01-13 DIAGNOSIS — Z7984 Long term (current) use of oral hypoglycemic drugs: Secondary | ICD-10-CM | POA: Insufficient documentation

## 2019-01-13 DIAGNOSIS — Z87891 Personal history of nicotine dependence: Secondary | ICD-10-CM | POA: Diagnosis not present

## 2019-01-13 DIAGNOSIS — E1165 Type 2 diabetes mellitus with hyperglycemia: Secondary | ICD-10-CM | POA: Diagnosis not present

## 2019-01-13 DIAGNOSIS — K7460 Unspecified cirrhosis of liver: Secondary | ICD-10-CM | POA: Insufficient documentation

## 2019-01-13 DIAGNOSIS — R739 Hyperglycemia, unspecified: Secondary | ICD-10-CM

## 2019-01-13 LAB — URINALYSIS, ROUTINE W REFLEX MICROSCOPIC
Bacteria, UA: NONE SEEN
Bilirubin Urine: NEGATIVE
Glucose, UA: NEGATIVE mg/dL
Hgb urine dipstick: NEGATIVE
Ketones, ur: NEGATIVE mg/dL
Nitrite: NEGATIVE
Protein, ur: NEGATIVE mg/dL
Specific Gravity, Urine: 1.013 (ref 1.005–1.030)
pH: 6 (ref 5.0–8.0)

## 2019-01-13 LAB — CBC
HCT: 45.2 % (ref 36.0–46.0)
Hemoglobin: 15.2 g/dL — ABNORMAL HIGH (ref 12.0–15.0)
MCH: 31.3 pg (ref 26.0–34.0)
MCHC: 33.6 g/dL (ref 30.0–36.0)
MCV: 93 fL (ref 80.0–100.0)
Platelets: 99 10*3/uL — ABNORMAL LOW (ref 150–400)
RBC: 4.86 MIL/uL (ref 3.87–5.11)
RDW: 13.3 % (ref 11.5–15.5)
WBC: 5.4 10*3/uL (ref 4.0–10.5)
nRBC: 0 % (ref 0.0–0.2)

## 2019-01-13 LAB — BASIC METABOLIC PANEL
Anion gap: 10 (ref 5–15)
BUN: 13 mg/dL (ref 8–23)
CO2: 25 mmol/L (ref 22–32)
Calcium: 9.2 mg/dL (ref 8.9–10.3)
Chloride: 102 mmol/L (ref 98–111)
Creatinine, Ser: 0.61 mg/dL (ref 0.44–1.00)
GFR calc Af Amer: >60 mL/min (ref >60)
GFR calc non Af Amer: >60 mL/min (ref >60)
Glucose, Bld: 287 mg/dL — ABNORMAL HIGH (ref 70–99)
Potassium: 3.8 mmol/L (ref 3.5–5.1)
Sodium: 137 mmol/L (ref 135–145)

## 2019-01-13 LAB — CBG MONITORING, ED
Glucose-Capillary: 282 mg/dL — ABNORMAL HIGH (ref 70–99)
Glucose-Capillary: 184 mg/dL — ABNORMAL HIGH (ref 70–99)

## 2019-01-13 MED ORDER — METFORMIN HCL 500 MG PO TABS: 500.0000 mg | ORAL_TABLET | Freq: Two times a day (BID) | ORAL | 0 refills | Status: DC

## 2019-01-13 MED ORDER — BLOOD GLUCOSE MONITOR KIT: PACK | 0 refills | Status: DC

## 2019-01-13 NOTE — ED Triage Notes (Signed)
States blood sugar has been been in the 400's and today in the 200's today, none of the 3P's states feels tired.

## 2019-01-13 NOTE — Discharge Instructions (Signed)
Today your blood sugar was high however the rest of your blood work was reassuring.  Chart review shows that your primary care doctor previously wanted to start you on metformin, therefore I have given you a prescription for metformin today.  I have also given you information about diabetes and nutrition.  As we discussed please avoid foods that are high in carbohydrates or sugar, specifically stop drinking sweet tea.  I would recommend that you instead switch to unsweetened tea.

## 2019-01-13 NOTE — Telephone Encounter (Signed)
Received a Skype from front office staff stating " I have a Hunter pt on the phone who needs to be triaged for high blood sugar    her reading was 457"  Per Dr.Hunter verbally pt needs to go to local UC. Called pt to Triage and ask here to recheck her sugar readings. Pt stated she is on her way to work and cannot check her sugar readings. Pt stated she does not have a glucometer. I advised  When was the last time she checked it pt stated yesterday. Was rushed off the phone and pt stated she will go to the UC when she gets off at 4pm.

## 2019-01-14 NOTE — ED Provider Notes (Signed)
[facility] DEPT Provider Note   CSN: [id number] Arrival date & time: 01/13/19  1730     History   Chief Complaint Chief Complaint  Patient presents with  . Hyperglycemia    HPI Heather Townsend is a 73 y.o. female with a past medical history of Karlene Lineman, DM 2, who presents today for evaluation of hyperglycemia.  History obtained from patient and chart review.  Her primary care doctor had previously recommended that she start metformin however she did not wish to do this.  She states that she visited a friend a week ago and checked her sugar when it was in the 400s.  She visited them again today and it was in the 400s again.  She called her doctor.  Chart review shows that they recommended that she go to an urgent care or be seen, patient reports that she was told to come to the emergency room.  She denies polyuria, polydipsia.  She states that she attempted diet changes over the past week however her sugar remains high.  She states that she drinks about half a gallon of sweet tea every day.  She reports that she does consume foods that are high in carbohydrates.   Triage note reports that patient states she feels tired however she did not report this during interview.  Chart review shows that previous A1c's have been elevated.     HPI  Past Medical History:  Diagnosis Date  . Allergy   . Cirrhosis ([place])   . Diverticulosis   . DIVERTICULOSIS, COLON 01/03/2007  . Fatty infiltration of liver   . GERD (gastroesophageal reflux disease)   . Hepatitis, chronic active ([place])    NASH  . Hiatal hernia   . HIATAL HERNIA 12/07/2006  . Hyperlipidemia   . Low back pain     Patient Active Problem List   Diagnosis Date Noted  . Vitamin B12 deficiency 01/17/2018  . Left thyroid nodule 11/06/2015  . Shortness of breath 08/16/2015  . Cervical disc disease 04/16/2014  . Type 2 diabetes mellitus ([place]) 03/08/2014  . Osteopenia 03/07/2014  . NASH (nonalcoholic  steatohepatitis) 04/04/2013  . Rash, skin 01/25/2013  . Chest pain 05/11/2012  . Renal calculus or stone 04/27/2011  . GERD 09/05/2007  . Dyslipidemia 08/16/2007    Past Surgical History:  Procedure Laterality Date  . APPENDECTOMY    . EYE SURGERY     macular hole  . ganglion cysts     left hand  . HAND SURGERY  1996  . SHOULDER SURGERY  2006   rt shoulder  . TONSILLECTOMY    . TUBAL LIGATION    . UTERINE SUSPENSION       OB History   No obstetric history on file.      Home Medications    Prior to Admission medications   Medication Sig Start Date End Date Taking? Authorizing Provider  aspirin 81 MG chewable tablet Chew 81 mg by mouth daily as needed for mild pain.   Yes [provider]  Cyanocobalamin 1000 MCG/ML KIT Inject 1,000 mg as directed. Pt does self injections twice a month.   Yes [provider]  oxyCODONE-acetaminophen (PERCOCET/ROXICET) 5-325 MG per tablet Take 0.5 tablets by mouth every 8 (eight) hours as needed for moderate pain or severe pain.    Yes [provider]  blood glucose meter kit and supplies KIT Dispense based on patient and insurance preference. Use up to four times daily as directed. (FOR  ICD-9 250.00, 250.01). 01/13/19   Marin Olp, MD  metFORMIN (GLUCOPHAGE) 500 MG tablet Take 1 tablet (500 mg total) by mouth 2 (two) times daily with a meal for 7 days. 01/13/19 01/20/19  Lorin Glass, PA-C    Family History Family History  Problem Relation Age of Onset  . Heart disease Mother   . Diabetes Mother   . Pancreatic cancer Paternal Uncle   . Colon cancer Paternal Uncle   . Stomach cancer Paternal Uncle   . Cancer Maternal Aunt        Stomach  . Esophageal cancer Maternal Aunt   . Esophageal cancer Maternal Uncle   . Rectal cancer Neg Hx     Social History Social History   Tobacco Use  . Smoking status: Former Smoker    Packs/day: 0.75    Years: 15.00    Pack years: 11.25    Types: Cigarettes     Quit date: 06/01/1986    Years since quitting: 32.6  . Smokeless tobacco: Never Used  Substance Use Topics  . Alcohol use: Yes    Alcohol/week: 0.0 standard drinks    Comment: rare  . Drug use: No     Allergies   Celecoxib, Codeine, Ibuprofen, Meloxicam, Nsaids, Rofecoxib, and Sulfonamide derivatives   Review of Systems Review of Systems  Constitutional: Negative for chills and fever.  Gastrointestinal: Negative for abdominal pain.  Endocrine: Negative for polydipsia, polyphagia and polyuria.  Genitourinary: Negative for dysuria, frequency and urgency.  Musculoskeletal: Negative for back pain.  All other systems reviewed and are negative.    Physical Exam Updated Vital Signs BP 130/81   Pulse 70   Temp 98 F (36.7 C) (Oral)   Resp 16   Ht '5\' 4"'$  (1.626 m)   Wt 72.6 kg   SpO2 99%   BMI 27.46 kg/m   Physical Exam Vitals signs and nursing note reviewed.  Constitutional:      General: She is not in acute distress.    Appearance: She is well-developed. She is not diaphoretic.  HENT:     Head: Normocephalic and atraumatic.  Eyes:     General: No scleral icterus.       Right eye: No discharge.        Left eye: No discharge.     Conjunctiva/sclera: Conjunctivae normal.  Neck:     Musculoskeletal: Normal range of motion.  Cardiovascular:     Rate and Rhythm: Normal rate.  Pulmonary:     Effort: Pulmonary effort is normal. No respiratory distress.     Breath sounds: No stridor.  Abdominal:     General: Abdomen is flat. There is no distension.  Musculoskeletal:        General: No deformity.  Skin:    General: Skin is warm and dry.  Neurological:     General: No focal deficit present.     Mental Status: She is alert and oriented to person, place, and time.     Motor: No abnormal muscle tone.  Psychiatric:        Mood and Affect: Mood normal.        Behavior: Behavior normal.      ED Treatments / Results  Labs (all labs ordered are listed, but only  abnormal results are displayed) Labs Reviewed  BASIC METABOLIC PANEL - Abnormal; Notable for the following components:      Result Value   Glucose, Bld 287 (*)    All other components within normal limits  CBC - Abnormal; Notable for the following components:   Hemoglobin 15.2 (*)    Platelets 99 (*)    All other components within normal limits  URINALYSIS, ROUTINE W REFLEX MICROSCOPIC - Abnormal; Notable for the following components:   Leukocytes,Ua SMALL (*)    All other components within normal limits  CBG MONITORING, ED - Abnormal; Notable for the following components:   Glucose-Capillary 282 (*)    All other components within normal limits  CBG MONITORING, ED - Abnormal; Notable for the following components:   Glucose-Capillary 184 (*)    All other components within normal limits    EKG None  Radiology No results found.  Procedures Procedures (including critical care time)  Medications Ordered in ED Medications - No data to display   Initial Impression / Assessment and Plan / ED Course  I have reviewed the triage vital signs and the nursing notes.  Pertinent labs & imaging results that were available during my care of the patient were reviewed by me and considered in my medical decision making (see chart for details).       Patient presents today for evaluation of hyperglycemia.  Chart review shows that she has previously been diagnosed with diabetes however has not been taking any medications.  She denies any polydipsia, polyuria, or other symptoms.  On arrival her glucose was elevated at 287, she reports that it had been over 400 earlier today.  Her hemoglobin is slightly elevated at 15.2.  Her blood sugar decreased to 184 while in the emergency room without treatment or intervention.  Urine does not have glucose.  Chart review shows that patient PCP previously wanted to start her on metformin however patient refused.  I have given her a prescription for 7 days of  metformin.  She is also counseled on appropriate diet choices and I recommended that she switch from sweet tea to unsweetened tea and start weaning down her tea consumption in general.  Her platelets are low however this appears to be consistent with her baseline.  PCP follow up.   This patient was seen as a shared visit with Dr. Lita Mains. Return precautions were discussed with patient who states their understanding.  At the time of discharge patient denied any unaddressed complaints or concerns.  Patient is agreeable for discharge home.   Final Clinical Impressions(s) / ED Diagnoses   Final diagnoses:  Hyperglycemia    ED Discharge Orders         Ordered    metFORMIN (GLUCOPHAGE) 500 MG tablet  2 times daily with meals     01/13/19 2351           Lorin Glass, [place] 01/14/19 0104    Julianne Rice, MD 01/14/19 [phone]

## 2019-01-17 ENCOUNTER — Encounter: Payer: Self-pay | Admitting: Gastroenterology

## 2019-02-14 ENCOUNTER — Encounter: Payer: Self-pay | Admitting: Family Medicine

## 2019-02-14 ENCOUNTER — Ambulatory Visit: Payer: Medicare Other | Admitting: Family Medicine

## 2019-02-14 ENCOUNTER — Ambulatory Visit (INDEPENDENT_AMBULATORY_CARE_PROVIDER_SITE_OTHER): Payer: Medicare Other | Admitting: Family Medicine

## 2019-02-14 ENCOUNTER — Other Ambulatory Visit: Payer: Self-pay

## 2019-02-14 ENCOUNTER — Ambulatory Visit (AMBULATORY_SURGERY_CENTER): Payer: Self-pay

## 2019-02-14 VITALS — BP 118/80 | HR 75 | Temp 98.4°F | Ht 64.0 in | Wt 160.2 lb

## 2019-02-14 VITALS — Ht 64.0 in | Wt 160.6 lb

## 2019-02-14 DIAGNOSIS — E538 Deficiency of other specified B group vitamins: Secondary | ICD-10-CM | POA: Diagnosis not present

## 2019-02-14 DIAGNOSIS — E785 Hyperlipidemia, unspecified: Secondary | ICD-10-CM | POA: Diagnosis not present

## 2019-02-14 DIAGNOSIS — Z1239 Encounter for other screening for malignant neoplasm of breast: Secondary | ICD-10-CM

## 2019-02-14 DIAGNOSIS — Z780 Asymptomatic menopausal state: Secondary | ICD-10-CM

## 2019-02-14 DIAGNOSIS — I8500 Esophageal varices without bleeding: Secondary | ICD-10-CM | POA: Diagnosis not present

## 2019-02-14 DIAGNOSIS — K7581 Nonalcoholic steatohepatitis (NASH): Secondary | ICD-10-CM

## 2019-02-14 DIAGNOSIS — E119 Type 2 diabetes mellitus without complications: Secondary | ICD-10-CM

## 2019-02-14 DIAGNOSIS — K219 Gastro-esophageal reflux disease without esophagitis: Secondary | ICD-10-CM

## 2019-02-14 DIAGNOSIS — E1165 Type 2 diabetes mellitus with hyperglycemia: Secondary | ICD-10-CM

## 2019-02-14 DIAGNOSIS — Z86010 Personal history of colonic polyps: Secondary | ICD-10-CM

## 2019-02-14 DIAGNOSIS — Z1231 Encounter for screening mammogram for malignant neoplasm of breast: Secondary | ICD-10-CM

## 2019-02-14 DIAGNOSIS — G500 Trigeminal neuralgia: Secondary | ICD-10-CM

## 2019-02-14 DIAGNOSIS — J8410 Pulmonary fibrosis, unspecified: Secondary | ICD-10-CM

## 2019-02-14 MED ORDER — PEG 3350-KCL-NA BICARB-NACL 420 G PO SOLR: 4000.0000 mL | Freq: Once | ORAL | 0 refills | Status: AC

## 2019-02-14 MED ORDER — METFORMIN HCL 500 MG PO TABS: 500.0000 mg | ORAL_TABLET | Freq: Two times a day (BID) | ORAL | 3 refills | Status: DC

## 2019-02-14 NOTE — Assessment & Plan Note (Signed)
Trigeminal neuralgia related to dental work- didn't take gabapentin through neurology-has follow-up available with neurology if needed- has very sparingly used hydrocodone if has severe pain

## 2019-02-14 NOTE — Assessment & Plan Note (Addendum)
Has had esophageal varices in the past.  Upcoming EGD planned- following up with Heather Townsend as well with [facility] liver care in regarding Heather Townsend.  If any varices are still present could consider beta-blocker- if blood pressure would tolerate it

## 2019-02-14 NOTE — Assessment & Plan Note (Signed)
Patient denies shortness of breath.  Encouraged her to keep scheduled follow-up with [name] on October 13

## 2019-02-14 NOTE — Progress Notes (Signed)
Denies allergies to eggs or soy products. Denies complication of anesthesia or sedation. Denies use of weight loss medication. Denies use of O2.   Emmi instructions given for colonoscopy.  

## 2019-02-14 NOTE — Progress Notes (Signed)
Phone [phone]   Subjective:  Heather Townsend is a 73 y.o. year old very pleasant female patient who presents for/with See problem oriented charting Chief Complaint  Patient presents with  . Follow-up    Not fasting today.   . Diabetes  . Gastroesophageal Reflux  . Dyslipidemia  . Osteopenia   ROS-no fever/chills/cough.  Has had hyperglycemia but not regularly checking blood sugar.  No chest pain reported.  Past Medical History-  Patient Active Problem List   Diagnosis Date Noted  . Pulmonary fibrosis ([place]) 02/14/2019    Priority: High  . Shortness of breath 08/16/2015    Priority: High  . Type 2 diabetes mellitus ([place]) 03/08/2014    Priority: High  . NASH (nonalcoholic steatohepatitis) 04/04/2013    Priority: High  . Esophageal varices without bleeding ([place]) 02/14/2019    Priority: Medium  . Left thyroid nodule 11/06/2015    Priority: Medium  . Cervical disc disease 04/16/2014    Priority: Medium  . GERD 09/05/2007    Priority: Medium  . Dyslipidemia 08/16/2007    Priority: Medium  . Vitamin B12 deficiency 01/17/2018    Priority: Low  . Osteopenia 03/07/2014    Priority: Low  . Rash, skin 01/25/2013    Priority: Low  . Chest pain 05/11/2012    Priority: Low  . Renal calculus or stone 04/27/2011    Priority: Low  . Uncontrolled type 2 diabetes mellitus with hyperglycemia ([place]) 02/14/2019  . Trigeminal neuralgia 02/14/2019    Medications- reviewed and updated Current Outpatient Medications  Medication Sig Dispense Refill  . aspirin 81 MG chewable tablet Chew 81 mg by mouth daily as needed for mild pain.    . blood glucose meter kit and supplies KIT Dispense based on patient and insurance preference. Use up to four times daily as directed. (FOR ICD-9 250.00, 250.01). 1 each 0  . Cyanocobalamin 1000 MCG/ML KIT Inject 1,000 mg as directed. Pt does self injections twice a month.    . esomeprazole (NEXIUM) 40 MG packet Take 40 mg by mouth daily before breakfast.     . OVER THE COUNTER MEDICATION Magnesium/ blend, One tablet as needed.    Marland Kitchen oxyCODONE-acetaminophen (PERCOCET/ROXICET) 5-325 MG per tablet Take 0.5 tablets by mouth every 8 (eight) hours as needed for moderate pain or severe pain.     . polyethylene glycol-electrolytes (NULYTELY/GOLYTELY) 420 g solution Take 4,000 mLs by mouth once for 1 dose. 4000 mL 0  . metFORMIN (GLUCOPHAGE) 500 MG tablet Take 1 tablet (500 mg total) by mouth 2 (two) times daily with a meal. 180 tablet 3   No current facility-administered medications for this visit.      Objective:  BP 118/80 (BP Location: Left Arm, Patient Position: Sitting, Cuff Size: Normal)   Pulse 75   Temp 98.4 F (36.9 C) (Temporal)   Ht '5\' 4"'$  (1.626 m)   Wt 160 lb 3.2 oz (72.7 kg)   SpO2 95%   BMI 27.50 kg/m  Gen: NAD, resting comfortably CV: RRR no murmurs rubs or gallops Lungs: CTAB no crackles, wheeze, rhonchi Abdomen: soft/nontender/nondistended/normal bowel sounds.  Ext: no edema Skin: warm, dry    Diabetic Foot Exam - Simple   Simple Foot Form Diabetic Foot exam was performed with the following findings: Yes 02/14/2019  4:13 PM  Visual Inspection No deformities, no ulcerations, no other skin breakdown bilaterally: Yes Sensation Testing Intact to touch and monofilament testing bilaterally: Yes Pulse Check Posterior Tibialis and Dorsalis pulse intact bilaterally: Yes  Comments        Assessment and Plan   # Diabetes S: Suspect very poorly controlled.  Patient had emergency room visit when blood sugar was over 400-fortunately trended down without medication.  They discharged her on metformin 500 milligrams twice daily but only given 14 pills-she has been trying to space these out  CBGs- Not checking BG at home. Denies hypoglycemic episodes.  Exercise and diet- Carbs and sweets on moderation over the past month. No structures exercise regimen, works around the house and in the yard.  Drinking a fair amount of sweet tea  Lab Results  Component Value Date   HGBA1C 7.9 (H) 01/17/2018   HGBA1C 7.1 (H) 05/02/2015   HGBA1C 6.5 08/02/2014   A/P: Update A1c-suspect worsening control.   - Patient is very resistant to adding medication but she does at least agree to take metformin 500 mg twice daily-beyond that she would not want to add medication -Admits to overdoing sweets and drinking sweet tea-possibly could have significant improvement with cutting these out-she reports she has been able to control blood sugar in the past with improving diet -She will let me know any #s under 80 -Did get a new meter-she will reach out if she needs Lancets and strips   #NASH S: Regularly has follow-up with [name] with [facility] liver care-last appointment got canceled due to COVID-19 Hepatic Function Latest Ref Rng & Units 01/17/2018 09/05/2015 05/02/2015  Total Protein 6.0 - 8.3 g/dL 8.3 8.4(H) 7.8  Albumin 3.5 - 5.2 g/dL 4.2 4.3 4.0  AST 0 - 37 U/L 83(H) 128(H) 95(H)  ALT 0 - 35 U/L 83(H) 118(H) 106(H)  Alk Phosphatase 39 - 117 U/L 97 120(H) 117  Total Bilirubin 0.2 - 1.2 mg/dL 0.8 0.6 0.6  Bilirubin, Direct 0.0 - 0.3 mg/dL - - -   A/P: Repeat LFTs today-encouraged follow-up with [facility] liver care  #Dyslipidemia S: Poorly controlled on no statin-particularly in light of diabetes Lab Results  Component Value Date   CHOL 182 01/17/2018   HDL 46.50 01/17/2018   LDLCALC 111 (H) 01/17/2018   LDLDIRECT 148.3 11/28/2012   TRIG 121.0 01/17/2018   CHOLHDL 4 01/17/2018   A/P: Recommended we consider once weekly statin even at low-dose- patient is hesitant to start this but will discuss with [facility] liver care and consider at future visit  # Osteopenia S: Last bone density was in 2016 A/P: Patient agrees to updating bone density- also think that she is only sparingly taking over-the-counter PPI.  After next bone density will need to make sure to reinforce calcium through diet and vitamin D intake for repletion   # GERD S: Takes no  over-the-counter PPI- sounds sparingly such as twice a week A/P: Discussed PPI relation to osteoporosis/osteopenia- could trial Pepcid-she wants to continue with current medication since she is sparingly taking   Pulmonary fibrosis ([place]) Patient denies shortness of breath.  Encouraged her to keep scheduled follow-up with [name] on October 13  Esophageal varices without bleeding ([place]) Has had esophageal varices in the past.  Upcoming EGD planned- following up with dawn drazek as well with [facility] liver care in regarding Karlene Lineman.  If any varices are still present could consider beta-blocker- if blood pressure would tolerate it  Trigeminal neuralgia Trigeminal neuralgia related to dental work- didn't take gabapentin through neurology-has follow-up available with neurology if needed- has very sparingly used hydrocodone if has severe pain   #Health maintenance 1. order mammogram today 2.Declines vaccines-pneumonia, flu, Shingrix  Recommended follow up: Recommended 4 months diabetes follow-up-sooner if needed Future Appointments  Date Time Provider [place]  02/28/2019 11:00 AM Ladene Artist, MD [facility]  03/14/2019  2:00 PM Marshell Garfinkel, MD [facility] None   Lab/Order associations:   ICD-10-CM   1. Uncontrolled type 2 diabetes mellitus with hyperglycemia (HCC) Chronic E11.65   2. Dyslipidemia  E78.5 CBC    Comprehensive metabolic panel    Lipid panel    TSH  3. NASH (nonalcoholic steatohepatitis)  K75.81   4. Esophageal varices without bleeding, unspecified esophageal varices type (HCC) Chronic I85.00   5. Pulmonary fibrosis (HCC) Chronic J84.10   6. Trigeminal neuralgia  G50.0   7. Type 2 diabetes mellitus without complication, without long-term current use of insulin (HCC)  E11.9 CBC    Comprehensive metabolic panel    Lipid panel    Hemoglobin A1c    Microalbumin / creatinine urine ratio  8. Vitamin B12 deficiency  E53.8 Vitamin B12  9. Gastroesophageal  reflux disease without esophagitis  K21.9   10. Postmenopausal  Z78.0 DG Bone Density  11. Visit for screening mammogram  Z12.31 MM Digital Screening  12. Screening for breast cancer  Z12.39     Meds ordered this encounter  Medications  . metFORMIN (GLUCOPHAGE) 500 MG tablet    Sig: Take 1 tablet (500 mg total) by mouth 2 (two) times daily with a meal.    Dispense:  180 tablet    Refill:  3    Return precautions advised.  Garret Reddish, MD

## 2019-02-14 NOTE — Patient Instructions (Addendum)
Health Maintenance Due  Topic Date Due  . [place] in [place] 07/29/1955  . MAMMOGRAM - ordered. We will call you within two weeks about your referral. If you do not hear within 3 weeks, give [place] a call.   06/28/2016  . TETANUS/TDAP - can get this at your pharmacy- they will pay for it here if you have a cut/scrape 07/30/2016  . FOOT EXAM - today 06/23/2017  . COLONOSCOPY - scheduled for 02/28/19 04/11/2018   Schedule your bone density test at check out desk. You may also call directly to X-ray at [phone] to schedule an appointment that is convenient for you.  - located [address] across the street from [place] - in the basement - you do need an appointment for the bone density tests.   Please stop by lab before you go If you do not have mychart- we will call you about results within 5 business days of [place] receiving them.  If you have mychart- we will send your results within 3 business days of [place] receiving them.  If abnormal or we want to clarify a result, we will call or mychart you to make sure you receive the message.  If you have questions or concerns or don't hear within 5-7 days, please send [place] a message or call us.

## 2019-02-15 LAB — COMPREHENSIVE METABOLIC PANEL
ALT: 71 U/L — ABNORMAL HIGH (ref 0–35)
AST: 75 U/L — ABNORMAL HIGH (ref 0–37)
Albumin: 4 g/dL (ref 3.5–5.2)
Alkaline Phosphatase: 112 U/L (ref 39–117)
BUN: 12 mg/dL (ref 6–23)
CO2: 27 mEq/L (ref 19–32)
Calcium: 9.7 mg/dL (ref 8.4–10.5)
Chloride: 103 mEq/L (ref 96–112)
Creatinine, Ser: 0.71 mg/dL (ref 0.40–1.20)
GFR: 81.02 mL/min (ref >60.00)
Glucose, Bld: 153 mg/dL — ABNORMAL HIGH (ref 70–99)
Potassium: 3.9 mEq/L (ref 3.5–5.1)
Sodium: 139 mEq/L (ref 135–145)
Total Bilirubin: 0.7 mg/dL (ref 0.2–1.2)
Total Protein: 7.7 g/dL (ref 6.0–8.3)

## 2019-02-15 LAB — LIPID PANEL
Cholesterol: 171 mg/dL (ref 0–200)
HDL: 50.2 mg/dL (ref >39.00)
LDL Cholesterol: 102 mg/dL — ABNORMAL HIGH (ref 0–99)
NonHDL: 121.09
Total CHOL/HDL Ratio: 3
Triglycerides: 93 mg/dL (ref 0.0–149.0)
VLDL: 18.6 mg/dL (ref 0.0–40.0)

## 2019-02-15 LAB — TSH: TSH: 4.74 u[IU]/mL — ABNORMAL HIGH (ref 0.35–4.50)

## 2019-02-15 LAB — CBC
HCT: 44.9 % (ref 36.0–46.0)
Hemoglobin: 15.5 g/dL — ABNORMAL HIGH (ref 12.0–15.0)
MCHC: 34.5 g/dL (ref 30.0–36.0)
MCV: 93.2 fl (ref 78.0–100.0)
Platelets: 102 10*3/uL — ABNORMAL LOW (ref 150.0–400.0)
RBC: 4.82 Mil/uL (ref 3.87–5.11)
RDW: 14 % (ref 11.5–15.5)
WBC: 5.5 10*3/uL (ref 4.0–10.5)

## 2019-02-15 LAB — HEMOGLOBIN A1C: Hgb A1c MFr Bld: 11.8 % — ABNORMAL HIGH (ref 4.6–6.5)

## 2019-02-15 LAB — MICROALBUMIN / CREATININE URINE RATIO
Creatinine,U: 125.9 mg/dL
Microalb Creat Ratio: 0.7 mg/g (ref 0.0–30.0)
Microalb, Ur: 0.9 mg/dL (ref 0.0–1.9)

## 2019-02-15 LAB — VITAMIN B12: Vitamin B-12: 611 pg/mL (ref 211–911)

## 2019-02-20 ENCOUNTER — Telehealth: Payer: Self-pay | Admitting: Family Medicine

## 2019-02-20 NOTE — Telephone Encounter (Signed)
Called pt to get her scheduled for a 2-4 week follow up for diabetes in office. Patient is only able to come in for appt on Tuesdays, OK to schedule her in a sameday appt slot if needed per Singer. I PUT a hold on 9:40am on 03/07/19 just in case this appt time and day work OK for pt.

## 2019-02-27 ENCOUNTER — Telehealth: Payer: Self-pay

## 2019-02-27 NOTE — Telephone Encounter (Signed)
Covid-19 screening questions   Do you now or have you had a fever in the last 14 days?  Do you have any respiratory symptoms of shortness of breath or cough now or in the last 14 days?  Do you have any family members or close contacts with diagnosed or suspected Covid-19 in the past 14 days?  Have you been tested for Covid-19 and found to be positive?       

## 2019-02-28 ENCOUNTER — Encounter: Payer: Self-pay | Admitting: Gastroenterology

## 2019-02-28 ENCOUNTER — Other Ambulatory Visit: Payer: Self-pay

## 2019-02-28 ENCOUNTER — Ambulatory Visit (AMBULATORY_SURGERY_CENTER): Payer: Medicare Other | Admitting: Gastroenterology

## 2019-02-28 VITALS — BP 136/77 | HR 82 | Temp 97.9°F | Resp 39 | Ht 64.0 in | Wt 160.0 lb

## 2019-02-28 DIAGNOSIS — K259 Gastric ulcer, unspecified as acute or chronic, without hemorrhage or perforation: Secondary | ICD-10-CM | POA: Diagnosis not present

## 2019-02-28 DIAGNOSIS — I8510 Secondary esophageal varices without bleeding: Secondary | ICD-10-CM | POA: Diagnosis not present

## 2019-02-28 DIAGNOSIS — Z86010 Personal history of colonic polyps: Secondary | ICD-10-CM | POA: Diagnosis present

## 2019-02-28 DIAGNOSIS — K2960 Other gastritis without bleeding: Secondary | ICD-10-CM

## 2019-02-28 DIAGNOSIS — K222 Esophageal obstruction: Secondary | ICD-10-CM | POA: Diagnosis not present

## 2019-02-28 DIAGNOSIS — K2900 Acute gastritis without bleeding: Secondary | ICD-10-CM

## 2019-02-28 DIAGNOSIS — K449 Diaphragmatic hernia without obstruction or gangrene: Secondary | ICD-10-CM

## 2019-02-28 DIAGNOSIS — K7460 Unspecified cirrhosis of liver: Secondary | ICD-10-CM

## 2019-02-28 MED ORDER — SODIUM CHLORIDE 0.9 % IV SOLN: 500.0000 mL | Freq: Once | INTRAVENOUS | Status: DC

## 2019-02-28 NOTE — Progress Notes (Signed)
Called to room to assist during endoscopic procedure.  Patient ID and intended procedure confirmed with present staff. Received instructions for my participation in the procedure from the performing physician.  

## 2019-02-28 NOTE — Progress Notes (Signed)
Report given to PACU, vss 

## 2019-02-28 NOTE — Op Note (Signed)
[place] Patient Name: Heather Townsend Procedure Date: 02/28/2019 11:51 AM MRN: [id number] Endoscopist: Ladene Artist , MD Age: 73 Referring MD:  Date of Birth: 1945-12-19 Gender: Female Account #: [id number] Procedure:                Colonoscopy Indications:              Surveillance: Personal history of adenomatous                            polyps on last colonoscopy > 5 years ago. 2 distant                            relatives with colon cancer. Medicines:                Monitored Anesthesia Care Procedure:                Pre-Anesthesia Assessment:                           - Prior to the procedure, a History and Physical                            was performed, and patient medications and                            allergies were reviewed. The patient's tolerance of                            previous anesthesia was also reviewed. The risks                            and benefits of the procedure and the sedation                            options and risks were discussed with the patient.                            All questions were answered, and informed consent                            was obtained. Prior Anticoagulants: The patient has                            taken no previous anticoagulant or antiplatelet                            agents. ASA Grade Assessment: II - A patient with                            mild systemic disease. After reviewing the risks                            and benefits, the patient was deemed in  satisfactory condition to undergo the procedure.                           After obtaining informed consent, the colonoscope                            was passed under direct vision. Throughout the                            procedure, the patient's blood pressure, pulse, and                            oxygen saturations were monitored continuously. The                            Colonoscope was introduced  through the anus and                            advanced to the the cecum, identified by                            appendiceal orifice and ileocecal valve. The                            ileocecal valve, appendiceal orifice, and rectum                            were photographed. The quality of the bowel                            preparation was good. The colonoscopy was performed                            without difficulty. The patient tolerated the                            procedure well. Scope In: 11:52:01 AM Scope Out: [id number] PM Scope Withdrawal Time: 0 hours 8 minutes 42 seconds  Total Procedure Duration: 0 hours 14 minutes 18 seconds  Findings:                 The perianal and digital rectal examinations were                            normal.                           A few medium-mouthed diverticula were found in the                            left colon.                           Internal hemorrhoids were found during  retroflexion. The hemorrhoids were small and Grade                            I (internal hemorrhoids that do not prolapse).                           The exam was otherwise without abnormality on                            direct and retroflexion views. Complications:            No immediate complications. Estimated blood loss:                            None. Estimated Blood Loss:     Estimated blood loss: none. Impression:               - Internal hemorrhoids.                           - The examination was otherwise normal on direct                            and retroflexion views.                           - Diverticulosis in the left colon.                           - No specimens collected. Recommendation:           - Repeat colonoscopy in 5 years for surveillance.                           - Patient has a contact number available for                            emergencies. The signs and symptoms of potential                             delayed complications were discussed with the                            patient. Return to normal activities tomorrow.                            Written discharge instructions were provided to the                            patient.                           - High fiber diet.                           - Continue present medications.                           -  Await pathology results. Ladene Artist, MD 02/28/2019 12:10:55 PM This report has been signed electronically.

## 2019-02-28 NOTE — Op Note (Signed)
[place] Patient Name: Marguarite Markov Procedure Date: 02/28/2019 11:50 AM MRN: [id number] Endoscopist: Ladene Artist , MD Age: 73 Referring MD:  Date of Birth: 1945/12/19 Gender: Female Account #: [id number] Procedure:                Upper GI endoscopy Indications:              Screening procedure, Cirrhosis assess for varices. Medicines:                Monitored Anesthesia Care Procedure:                Pre-Anesthesia Assessment:                           - Prior to the procedure, a History and Physical                            was performed, and patient medications and                            allergies were reviewed. The patient's tolerance of                            previous anesthesia was also reviewed. The risks                            and benefits of the procedure and the sedation                            options and risks were discussed with the patient.                            All questions were answered, and informed consent                            was obtained. Prior Anticoagulants: The patient has                            taken no previous anticoagulant or antiplatelet                            agents. ASA Grade Assessment: II - A patient with                            mild systemic disease. After reviewing the risks                            and benefits, the patient was deemed in                            satisfactory condition to undergo the procedure.                           After obtaining informed consent, the endoscope was  passed under direct vision. Throughout the                            procedure, the patient's blood pressure, pulse, and                            oxygen saturations were monitored continuously. The                            Endoscope was introduced through the mouth, and                            advanced to the second part of duodenum. The upper                            GI  endoscopy was accomplished without difficulty.                            The patient tolerated the procedure well. Scope In: Scope Out: Findings:                 One benign-appearing, intrinsic mild stenosis was                            found at the gastroesophageal junction. This                            stenosis measured 1.5 cm (inner diameter). The                            stenosis was traversed.                           Grade I varices were found in the distal esophagus.                            They were 4 mm in largest diameter.                           The exam of the esophagus was otherwise normal.                           A small hiatal hernia was present.                           Localized moderate inflammation characterized by                            erythema, friability and granularity was found at                            the incisura and in the gastric antrum. Biopsies                            were taken with a  cold forceps for histology.                           The exam of the stomach was otherwise normal.                           The duodenal bulb and second portion of the                            duodenum were normal. Complications:            No immediate complications. Estimated Blood Loss:     Estimated blood loss was minimal. Impression:               - Benign-appearing esophageal stenosis.                           - Grade I esophageal varices.                           - Small hiatal hernia.                           - Gastritis. Biopsied.                           - Normal duodenal bulb and second portion of the                            duodenum. Recommendation:           - Patient has a contact number available for                            emergencies. The signs and symptoms of potential                            delayed complications were discussed with the                            patient. Return to normal activities tomorrow.                             Written discharge instructions were provided to the                            patient.                           - Resume previous diet.                           - Follow antireflux measures.                           - Continue present medications.                           -  Await pathology results.                           - Repeat upper endoscopy in 1 year for surveillance. Ladene Artist, MD 02/28/2019 12:25:51 PM This report has been signed electronically.

## 2019-02-28 NOTE — Patient Instructions (Signed)
Thank you for letting us take care of your healthcare needs today. Please see handouts given to you on Diverticulosis, Hemorrhoids, High Fiber Diet, Hiatal Hernia and Gastritis. Dr. Silvio Pate office will contact you regarding scheduling your Upper Endoscopy in 1 year.      YOU HAD AN ENDOSCOPIC PROCEDURE TODAY AT [facility]:   Refer to the procedure report that was given to you for any specific questions about what was found during the examination.  If the procedure report does not answer your questions, please call your gastroenterologist to clarify.  If you requested that your care partner not be given the details of your procedure findings, then the procedure report has been included in a sealed envelope for you to review at your convenience later.  YOU SHOULD EXPECT: Some feelings of bloating in the abdomen. Passage of more gas than usual.  Walking can help get rid of the air that was put into your GI tract during the procedure and reduce the bloating. If you had a lower endoscopy (such as a colonoscopy or flexible sigmoidoscopy) you may notice spotting of blood in your stool or on the toilet paper. If you underwent a bowel prep for your procedure, you may not have a normal bowel movement for a few days.  Please Note:  You might notice some irritation and congestion in your nose or some drainage.  This is from the oxygen used during your procedure.  There is no need for concern and it should clear up in a day or so.  SYMPTOMS TO REPORT IMMEDIATELY:   Following lower endoscopy (colonoscopy or flexible sigmoidoscopy):  Excessive amounts of blood in the stool  Significant tenderness or worsening of abdominal pains  Swelling of the abdomen that is new, acute  Fever of 100F or higher   Following upper endoscopy (EGD)  Vomiting of blood or coffee ground material  New chest pain or pain under the shoulder blades  Painful or persistently difficult swallowing  New shortness of  breath  Fever of 100F or higher  Black, tarry-looking stools  For urgent or emergent issues, a gastroenterologist can be reached at any hour by calling [phone].   DIET:  We do recommend a small meal at first, but then you may proceed to your regular diet.  Drink plenty of fluids but you should avoid alcoholic beverages for 24 hours.  ACTIVITY:  You should plan to take it easy for the rest of today and you should NOT DRIVE or use heavy machinery until tomorrow (because of the sedation medicines used during the test).    FOLLOW UP: Our staff will call the number listed on your records 48-72 hours following your procedure to check on you and address any questions or concerns that you may have regarding the information given to you following your procedure. If we do not reach you, we will leave a message.  We will attempt to reach you two times.  During this call, we will ask if you have developed any symptoms of COVID 19. If you develop any symptoms (ie: fever, flu-like symptoms, shortness of breath, cough etc.) before then, please call [phone].  If you test positive for Covid 19 in the 2 weeks post procedure, please call and report this information to [place].    If any biopsies were taken you will be contacted by phone or by letter within the next 1-3 weeks.  Please call us at [phone] if you have not heard about the  biopsies in 3 weeks.    SIGNATURES/CONFIDENTIALITY: You and/or your care partner have signed paperwork which will be entered into your electronic medical record.  These signatures attest to the fact that that the information above on your After Visit Summary has been reviewed and is understood.  Full responsibility of the confidentiality of this discharge information lies with you and/or your care-partner.

## 2019-03-02 ENCOUNTER — Telehealth: Payer: Self-pay | Admitting: *Deleted

## 2019-03-02 NOTE — Telephone Encounter (Signed)
  Follow up Call-  Call back number 02/28/2019 01/25/2017  Post procedure Call Back phone  # [phone]  Permission to leave phone message Yes Yes  Some recent data might be hidden     Patient questions:  1. Do you have a fever, pain , or abdominal swelling? Have you developed a fever since your procedure? no  2.   Have you had an respiratory symptoms (SOB or cough) since your procedure? no  3.   Have you tested positive for COVID 19 since your procedure no  4.   Have you had any family members/close contacts diagnosed with the COVID 19 since your procedure?  no   If yes to any of these questions please route to Joylene John, RN and Alphonsa Gin, Therapist, sports.  Pain Score  0 *  Have you tolerated food without any problems? Yes.    Have you been able to return to your normal activities? Yes.    Do you have any questions about your discharge instructions: Diet   No. Medications  No. Follow up visit  No.  Do you have questions or concerns about your Care? No.  Actions: * If pain score is 4 or above: No action needed, pain <4.

## 2019-03-03 ENCOUNTER — Encounter: Payer: Self-pay | Admitting: Gastroenterology

## 2019-03-03 IMAGING — US US ABDOMEN LIMITED
1 series · 13 of 25 positions shown · non-contrast
Comparison: MRI 03/17/2017 ultrasound 02/11/2017.

CLINICAL DATA: History of cirrhosis. Follow-up exam. HCC screening.

EXAM:
ULTRASOUND ABDOMEN LIMITED RIGHT UPPER QUADRANT

[Series 1: us abdomen limited · 0.22mm/px · 13 of 60 slices shown]
[im 1/60]
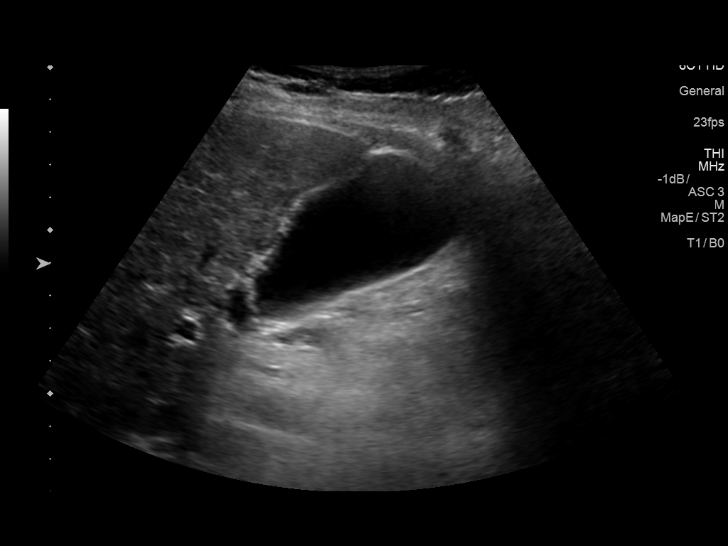
[im 5/60]
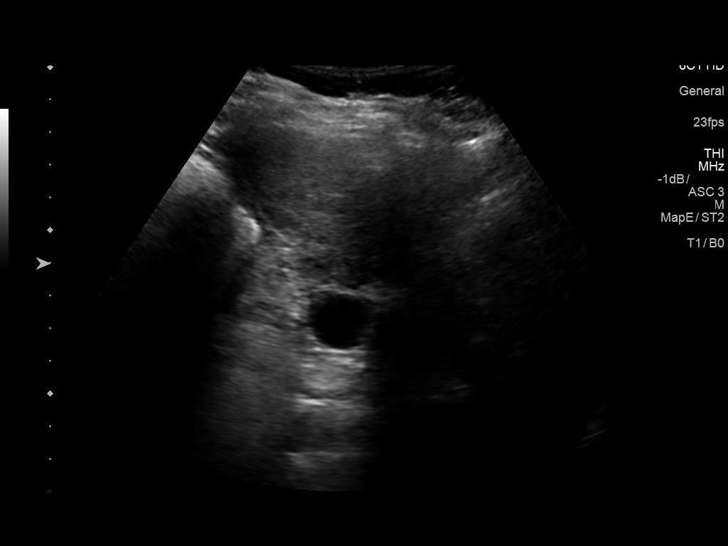
[im 10/60]
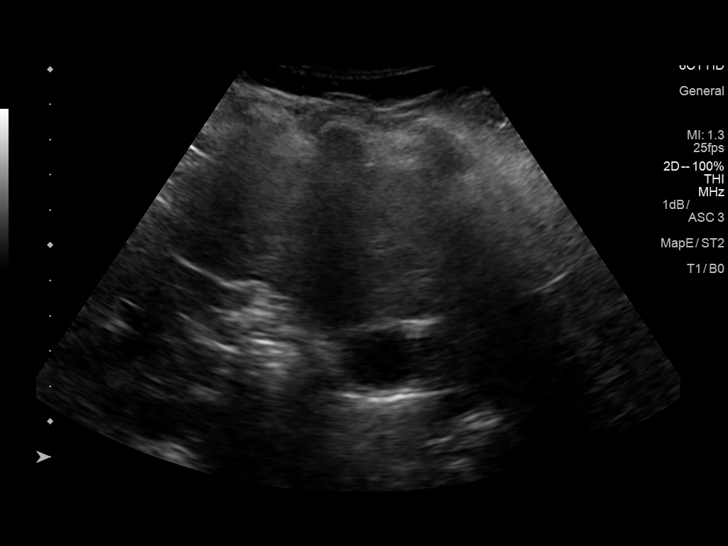
[im 15/60]
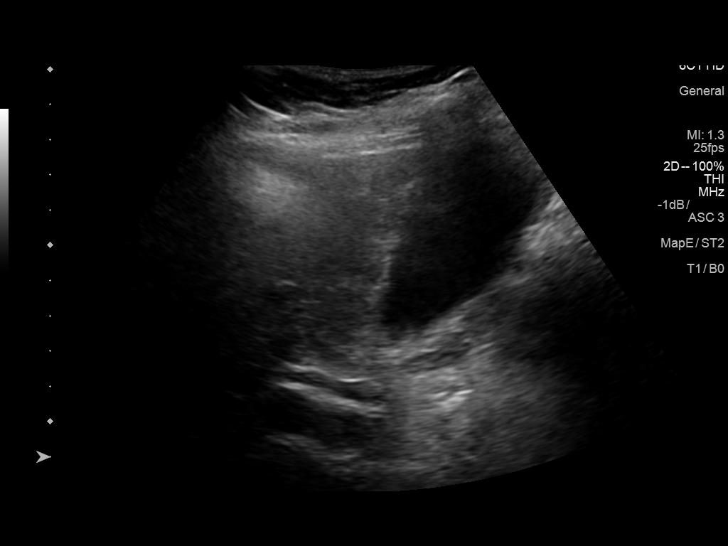
[im 20/60]
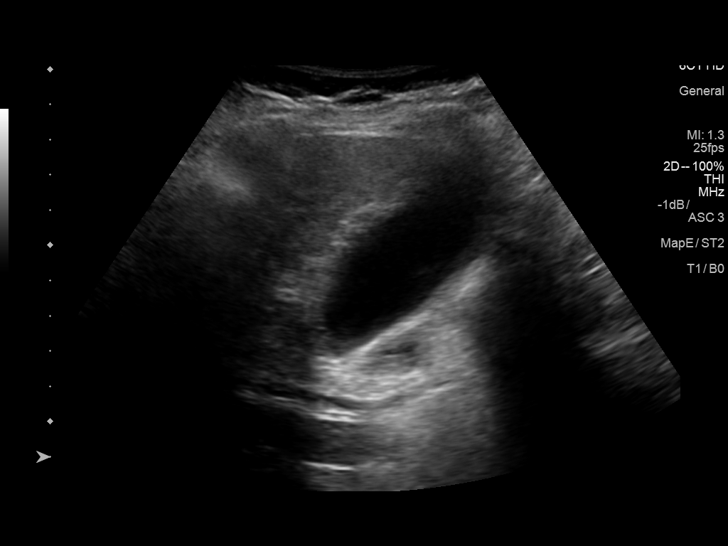
[im 25/60]
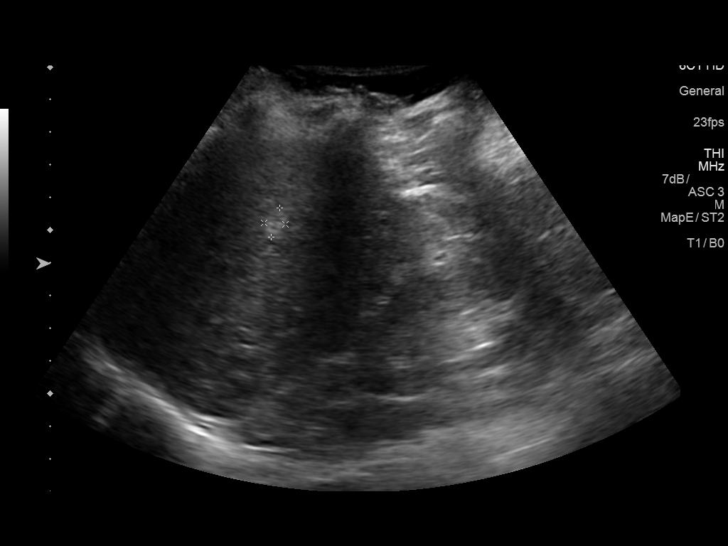
[im 30/60]
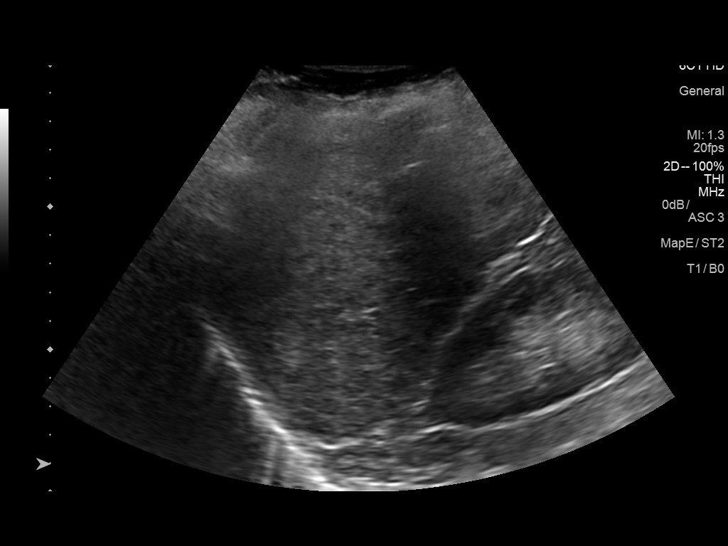
[im 35/60]
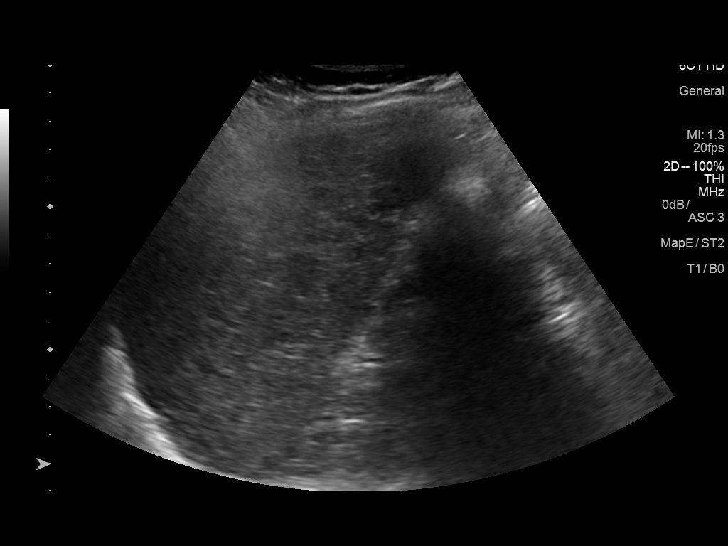
[im 40/60]
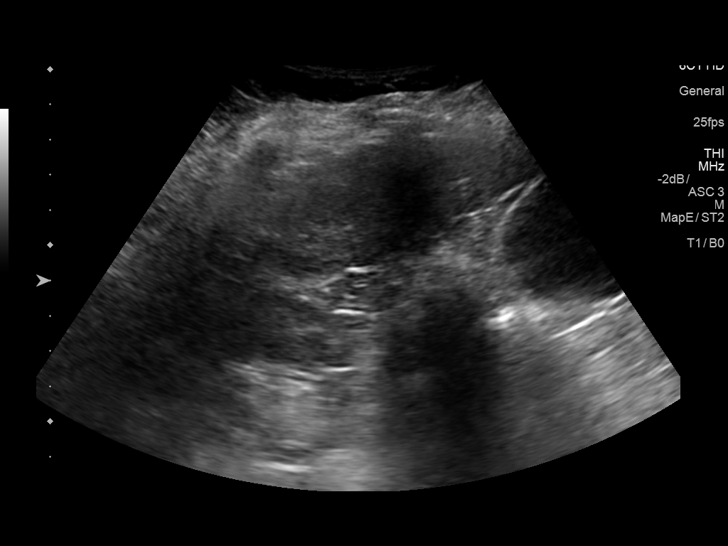
[im 45/60]
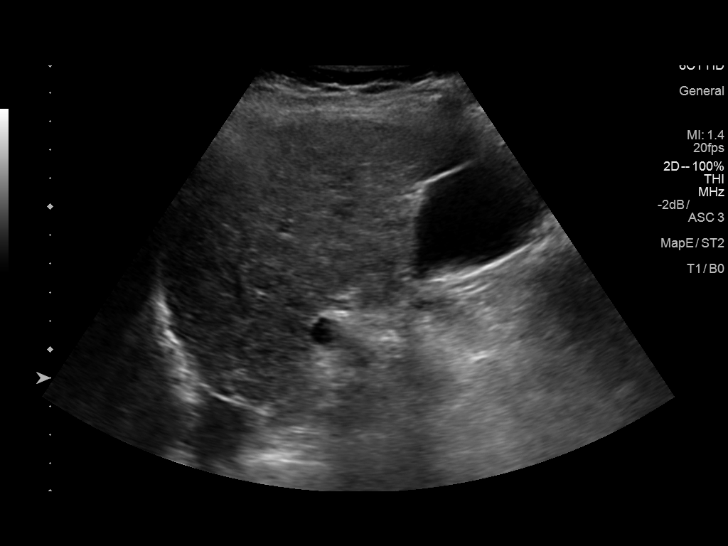
[im 50/60]
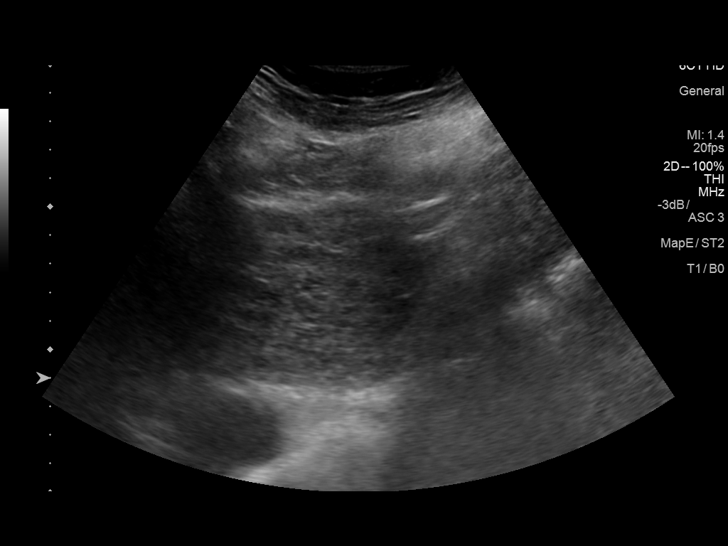
[im 55/60]
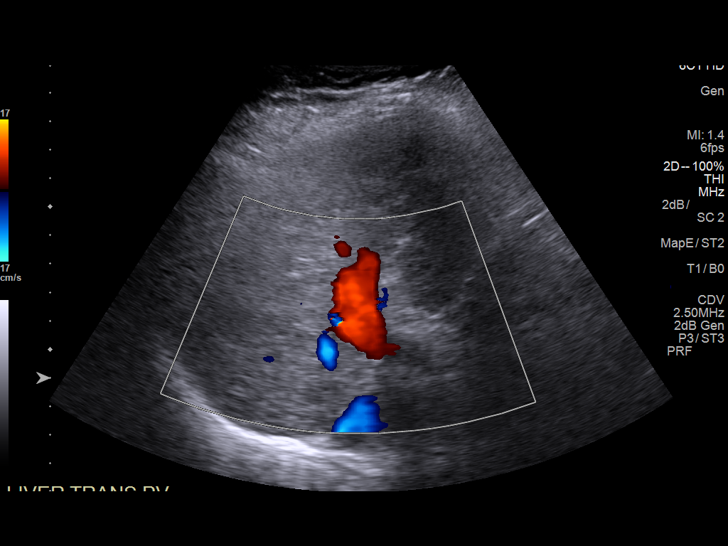
[im 60/60]
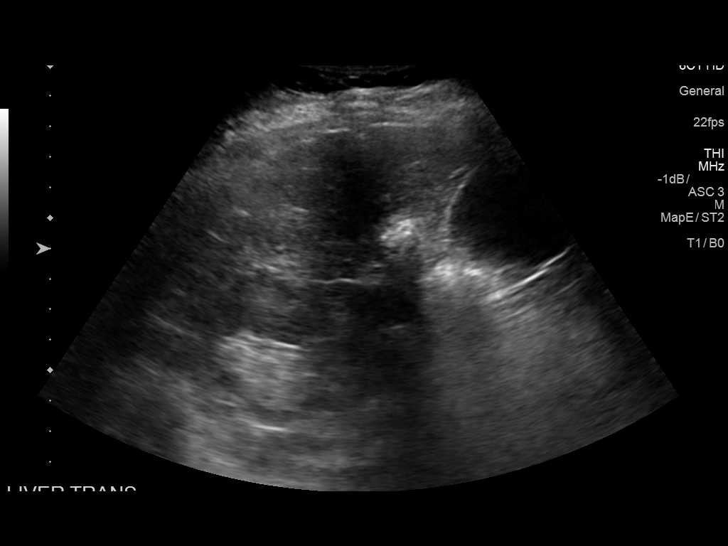

[13 of 25 positions shown; findings below may reference images not displayed]

FINDINGS: Gallbladder:

No gallstones or wall thickening visualized. No sonographic Murphy
sign noted by sonographer.

Common bile duct:

Diameter: 4.7 mm

Liver:

Heterogeneous hepatic parenchymal pattern with nodular contour
consistent with patient's known cirrhosis. New 1.0 cm hyperechoic
nodular density right hepatic lobe. Second 1.0 cm hyperechoic
nodular density right hepatic lobe, similar finding noted on prior
exam. 1.8 cm hypo nodular density right hepatic lobe, slightly
larger than on prior exam. MRI of the liver can be obtained to
further evaluate. Portal vein is patent on color Doppler imaging
with normal direction of blood flow towards the liver.
IMPRESSION: 1. Three nodules are noted in the liver on today's exam. There is a
new 1 cm hyperechoic nodule in the right hepatic lobe. There is a
stable 1 cm hyperechoic nodule right hepatic lobe. And there is a
1.8 cm hypoechoic nodular density right hepatic lobe, slightly
larger than on prior exam. MRI of the liver can be obtained for
further evaluation.

2.  No gallstones or biliary distention.

## 2019-03-07 ENCOUNTER — Ambulatory Visit: Payer: Medicare Other | Admitting: Family Medicine

## 2019-03-14 ENCOUNTER — Encounter: Payer: Self-pay | Admitting: Pulmonary Disease

## 2019-03-14 ENCOUNTER — Other Ambulatory Visit: Payer: Self-pay

## 2019-03-14 ENCOUNTER — Ambulatory Visit (INDEPENDENT_AMBULATORY_CARE_PROVIDER_SITE_OTHER): Payer: Medicare Other | Admitting: Pulmonary Disease

## 2019-03-14 VITALS — BP 120/76 | HR 74 | Temp 98.2°F | Ht 64.5 in | Wt 162.0 lb

## 2019-03-14 DIAGNOSIS — J8410 Pulmonary fibrosis, unspecified: Secondary | ICD-10-CM | POA: Diagnosis not present

## 2019-03-14 NOTE — Progress Notes (Signed)
Heather Townsend    [id number]    [birth year]  Primary Care Physician:Hunter, Brayton Mars, MD  Referring Physician: Marin Olp, MD [place],  [place] [address] [phone] Chief complaint:   Follow up for lung fibrosis.  HPI: Heather Townsend is a 73 year old with history of Karlene Lineman cirrhosis, GERD, hiatal hernia. She had an MRI of the abdomen as a part of follow-up for cirrhosis. It showed fibrosis at lung bases. She has been referred here for further follow-up. She has some mild dyspnea on exertion but no other symptoms confined to the chest. She denies any cough, wheezing, sputum production, hemoptysis. She does not have any symptoms suggestive of connective tissue, autoimmune disease. She does have history of unspecified lung fibrosis in her mother and aunt. She does not have any exposures at work to asbestos or other inhalants.  She had a thyroid ultrasound and biopsy for her nodule found on the CT chest.There is no evidence of malignancy as per the patient She has been started on synthroid for hypothyroidism but the patient stopped taking it.  Interim History:  Last clinic visit was canceled due to [place].  She is here for follow-up after an interval of more than 1 year.  States that her breathing is doing fine with no new issues.  Outpatient Encounter Medications as of 03/14/2019  Medication Sig  . aspirin 81 MG chewable tablet Chew 81 mg by mouth daily as needed for mild pain.  . Cyanocobalamin 1000 MCG/ML KIT Inject 1,000 mg as directed. Pt does self injections twice a month.  . esomeprazole (NEXIUM) 40 MG packet Take 40 mg by mouth daily before breakfast.  . metFORMIN (GLUCOPHAGE) 500 MG tablet Take 1 tablet (500 mg total) by mouth 2 (two) times daily with a meal.  . oxyCODONE-acetaminophen (PERCOCET/ROXICET) 5-325 MG per tablet Take 0.5 tablets by mouth every 8 (eight) hours as needed for moderate pain or severe pain.   . blood glucose meter kit and supplies KIT  Dispense based on patient and insurance preference. Use up to four times daily as directed. (FOR ICD-9 250.00, 250.01). (Patient not taking: Reported on 02/28/2019)  . [DISCONTINUED] OVER THE COUNTER MEDICATION Magnesium/ blend, One tablet as needed.   No facility-administered encounter medications on file as of 03/14/2019.    Physical Exam: Blood pressure 120/76, pulse 74, temperature 98.2 F (36.8 C), temperature source Temporal, height 5' 4.5" (1.638 m), weight 162 lb (73.5 kg), SpO2 96 %. Gen:      No acute distress HEENT:  EOMI, sclera anicteric Neck:     No masses; no thyromegaly Lungs:    Basal crackles CV:         Regular rate and rhythm; no murmurs Abd:      + bowel sounds; soft, non-tender; no palpable masses, no distension Ext:    No edema; adequate peripheral perfusion Skin:      Warm and dry; no rash Neuro: alert and oriented x 3 Psych: normal mood and affect  Data Reviewed: CT abdomen 12/04/14- Reticulation at bases, unchanged since 2011 CT abdomen 06/02/11- Reticulation at bases. Images reviewed. 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 NSIP fibrosis High res CT 10/01/16- ILD, fibrosis, unchanged.  High res CT 10/20/17-mild progression pulmonary fibrosis, possible honeycombing I have reviewed all images personally.  Serologies 09/05/15 CRP 0.4, sedimentation rate 43 Aldolase 11.2 P-ANCA 1:40 dsDNA- 6 Negative- Jo-1, centromere, RNP, RO, LA RA, CCP, Scl 70  PFTs  09/29/15 FVC 2.45 (80%), FEV1 2.18 (93%), F/F 89, TLC 79%, DLCO 53% Mild restriction, moderately severe diffusion defect.  Assessment:  Pulmonary fibrosis. Her last CT shows mild progression with what looks like early honeycombing.  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  She does have a strong family history of pulmonary fibrosis in her mother and aunt. She does not have any exposure history or symptoms suggestive of connective tissue, autoimmune disease. Serologies are negative  except for intermediate elevation in double-stranded DNA and mild elevation in ANCA of unclear significance.   We discussed again further work-up including lung biopsy but she does not want any invasive procedure. She does not want to try anti-fibrotics since she is afraid that it could make the liver condition worse. We will send her for a 6-minute walk test and high-resolution CT for evaluation of lungs She does not want to get the pulmonary function tests as she is afraid of viral contamination  Health maintenance Does not want to get vaccinations.  Plan/Recommendations: - Schedule high-resolution CT and 6-minute walk test  Follow-up in 2 to 4 weeks to discuss results and next steps.  Marshell Garfinkel MD  Pulmonary and Critical Care 03/14/2019, 2:03 PM  CC: Marin Olp, MD

## 2019-03-14 NOTE — Addendum Note (Signed)
Addended by: Hildred Alamin I on: 03/14/2019 02:23 PM   Modules accepted: Orders

## 2019-03-14 NOTE — Patient Instructions (Signed)
We will schedule you for a 6-minute walk test and a high-resolution CT Follow-up in clinic after these tests for review.

## 2019-03-23 ENCOUNTER — Telehealth: Payer: Self-pay | Admitting: Pulmonary Disease

## 2019-03-23 NOTE — Telephone Encounter (Signed)
Osvaldo Shipper, NT  P L[facility] Triage Pool  Cc: McMichael, Orland Mustard, Cameron Proud, Zachery Conch, Doristine Devoid, [place] J        Please put in new order and reschedule patient at another facility. We are unable to do CT because of insurance. Pt was scheduled 10/30.   Pt is aware appt was canceled.   Thank you   Erline Levine

## 2019-03-23 NOTE — Telephone Encounter (Signed)
New order placed

## 2019-03-23 NOTE — Addendum Note (Signed)
Addended by: Amado Coe on: 03/23/2019 05:13 PM   Modules accepted: Orders

## 2019-03-29 ENCOUNTER — Ambulatory Visit (HOSPITAL_COMMUNITY): Payer: Medicare Other

## 2019-03-31 ENCOUNTER — Inpatient Hospital Stay: Admission: RE | Admit: 2019-03-31 | Payer: Medicare Other | Source: Ambulatory Visit

## 2019-04-04 ENCOUNTER — Ambulatory Visit (HOSPITAL_COMMUNITY)
Admission: RE | Admit: 2019-04-04 | Discharge: 2019-04-04 | Disposition: A | Discharge status: Home / Self Care | Payer: Medicare Other | Source: Ambulatory Visit | Attending: Pulmonary Disease | Admitting: Pulmonary Disease

## 2019-04-04 ENCOUNTER — Other Ambulatory Visit: Payer: Self-pay

## 2019-04-04 DIAGNOSIS — J8410 Pulmonary fibrosis, unspecified: Secondary | ICD-10-CM | POA: Diagnosis not present

## 2019-04-05 ENCOUNTER — Telehealth: Payer: Self-pay | Admitting: Pulmonary Disease

## 2019-04-05 ENCOUNTER — Telehealth: Payer: Self-pay

## 2019-04-05 DIAGNOSIS — R768 Other specified abnormal immunological findings in serum: Secondary | ICD-10-CM

## 2019-04-05 NOTE — Telephone Encounter (Signed)
-----   Message from Marshell Garfinkel, MD sent at 04/05/2019  8:02 AM EST ----- Please let patient know CT shows slight worsening in pulmonary fibrosis Make sure that 6-minute walk test is ordered and follow-up in clinic to review Also make an referral to rheumatology for elevated ANCA, RNP and double-stranded DNA.

## 2019-04-05 NOTE — Telephone Encounter (Signed)
Spoke with pt, she wanted to know why she needed a referral to a rheumatologist. I advised her that her ANA level was elevated and she needled a consult for further evaluation. Pt understood and nothing further is needed

## 2019-04-11 ENCOUNTER — Ambulatory Visit (INDEPENDENT_AMBULATORY_CARE_PROVIDER_SITE_OTHER): Payer: Medicare Other | Admitting: Family Medicine

## 2019-04-11 ENCOUNTER — Encounter: Payer: Self-pay | Admitting: Family Medicine

## 2019-04-11 ENCOUNTER — Encounter: Payer: Self-pay | Admitting: Pulmonary Disease

## 2019-04-11 ENCOUNTER — Ambulatory Visit (INDEPENDENT_AMBULATORY_CARE_PROVIDER_SITE_OTHER): Payer: Medicare Other

## 2019-04-11 ENCOUNTER — Ambulatory Visit (INDEPENDENT_AMBULATORY_CARE_PROVIDER_SITE_OTHER): Payer: Medicare Other | Admitting: Pulmonary Disease

## 2019-04-11 ENCOUNTER — Other Ambulatory Visit: Payer: Self-pay

## 2019-04-11 VITALS — BP 122/70 | HR 80 | Temp 98.3°F | Ht 64.5 in | Wt 162.8 lb

## 2019-04-11 VITALS — BP 120/70 | HR 73 | Temp 98.0°F | Ht 64.5 in | Wt 163.0 lb

## 2019-04-11 DIAGNOSIS — J8410 Pulmonary fibrosis, unspecified: Secondary | ICD-10-CM

## 2019-04-11 DIAGNOSIS — E785 Hyperlipidemia, unspecified: Secondary | ICD-10-CM | POA: Diagnosis not present

## 2019-04-11 DIAGNOSIS — K7581 Nonalcoholic steatohepatitis (NASH): Secondary | ICD-10-CM | POA: Diagnosis not present

## 2019-04-11 DIAGNOSIS — E1165 Type 2 diabetes mellitus with hyperglycemia: Secondary | ICD-10-CM

## 2019-04-11 NOTE — Patient Instructions (Addendum)
Health Maintenance Due  Topic Date Due  . MAMMOGRAM -need to schedule 06/28/2016  . TETANUS/TDAP -pt declines, can get this at your pharmacy if you decide you would like it 07/30/2016   Thanks for taking the metformin- we need to be aggressive about cutting down sweets and sweet tea to help your sugar levels. Also would be aggressive about exercise at least 30 minutes a day.   We discussed possibly increasing metformin or adding other medicines but you wanted to hold off for now and put your best foot forward.   Schedule your bone density test at check out desk. You may also call directly to X-ray at [phone] to schedule an appointment that is convenient for you.  - located [address] across the [address] from [facility] - in the basement - you do need an appointment for the bone density tests.   Diabetes education-Get on youtube and access videos by Dr. Smiley Houseman (an endocrinologist. Listen to diabetes education parts 1-5

## 2019-04-11 NOTE — Patient Instructions (Signed)
We have reviewed the scans which show pulmonary fibrosis which has mildly progressed compared to 2017 I believe that this process may continue to slowly worsen over time. We have discussed 2 options for treatment.  These are medications called Ofev or Esbriet. Some of the side effects to watch out for are worsening liver function, indigestion, sunburn We will give you some literature to make sure you are familiar with these before you determine if you want to go on these  We have also referred you to a rheumatologist to make sure there is no autoimmune condition causing the lung fibrosis Follow-up 4 weeks.

## 2019-04-11 NOTE — Progress Notes (Addendum)
Heather Townsend    [id number]    1946/05/29  Primary Care Physician:Hunter, Brayton Mars, MD  Referring Physician: Marin Olp, MD [place],   [id number] [phone] Chief complaint:   Follow up for lung fibrosis.  HPI: Heather Townsend is a 73 year old with history of Heather Townsend cirrhosis, GERD, hiatal hernia. She had an MRI of the abdomen as a part of follow-up for cirrhosis. It showed fibrosis at lung bases. She has been referred here for further follow-up. She has some mild dyspnea on exertion but no other symptoms confined to the chest. She denies any cough, wheezing, sputum production, hemoptysis. She does not have any symptoms suggestive of connective tissue, autoimmune disease. She does have history of unspecified lung fibrosis in her mother and aunt. She does not have any exposures at work to asbestos or other inhalants.  She had a thyroid ultrasound and biopsy for her nodule found on the CT chest.There is no evidence of malignancy as per the patient She has been started on synthroid for hypothyroidism but the patient stopped taking it.  Interim History:  Here for review of CT scan.  States that her breathing is doing well with no new issues.  Outpatient Encounter Medications as of 04/11/2019  Medication Sig  . aspirin 81 MG chewable tablet Chew 81 mg by mouth daily as needed for mild pain.  . Cyanocobalamin 1000 MCG/ML KIT Inject 1,000 mg as directed. Pt does self injections twice a month.  . esomeprazole (NEXIUM) 40 MG packet Take 40 mg by mouth daily before breakfast.  . metFORMIN (GLUCOPHAGE) 500 MG tablet Take 1 tablet (500 mg total) by mouth 2 (two) times daily with a meal.  . oxyCODONE-acetaminophen (PERCOCET/ROXICET) 5-325 MG per tablet Take 0.5 tablets by mouth every 8 (eight) hours as needed for moderate pain or severe pain.   . [DISCONTINUED] blood glucose meter kit and supplies KIT Dispense based on patient and insurance preference. Use up to four  times daily as directed. (FOR ICD-9 250.00, 250.01). (Patient not taking: Reported on 02/28/2019)   No facility-administered encounter medications on file as of 04/11/2019.    Physical Exam: Blood pressure 122/70, pulse 80, temperature 98.3 F (36.8 C), temperature source Temporal, height 5' 4.5" (1.638 m), weight 162 lb 12.8 oz (73.8 kg), SpO2 95 %. Gen:      No acute distress HEENT:  EOMI, sclera anicteric Neck:     No masses; no thyromegaly Lungs:    Basal crackles CV:         Regular rate and rhythm; no murmurs Abd:      + bowel sounds; soft, non-tender; no palpable masses, no distension Ext:    No edema; adequate peripheral perfusion Skin:      Warm and dry; no rash Neuro: alert and oriented x 3 Psych: normal mood and affect  Data Reviewed: CT abdomen 12/04/14- Reticulation at bases, unchanged since 2011 CT abdomen 06/02/11- Reticulation at bases. Images reviewed. 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 NSIP fibrosis High res CT 10/01/16- ILD, fibrosis, unchanged.  High res CT 10/20/17-mild progression pulmonary fibrosis, possible honeycombing High-res CT 04/04/2019-mild progression of pulmonary fibrosis in UIP pattern. I have reviewed all images personally.  Serologies 09/05/15 CRP 0.4, sedimentation rate 43 Aldolase 11.2 P-ANCA 1:40 dsDNA- 6 Negative- Jo-1, centromere, RNP, RO, LA RA, CCP, Scl 70  PFTs 09/29/15 FVC 2.45 (80%), FEV1 2.18 (93%), F/F 89, TLC 79%, DLCO 53% Mild restriction, moderately  severe diffusion defect.  6-minute walk test 04/11/2019-510 m, exertional O2 sat 93%  Assessment:  Idiopathic pulmonary fibrosis Although there is minimal progression on recent CT compared to 2019 there is clearly worsening compared to her earliest high-res CT in 2017.  The pattern is UIP suggestive of idiopathic pulmonary fibrosis.  She does have a strong family history of pulmonary fibrosis in her mother and aunt. She does not have any exposure  history or symptoms suggestive of connective tissue, autoimmune disease. Serologies are negative except for intermediate elevation in double-stranded DNA and mild elevation in ANCA of unclear significance.   Given progression over time we discussed again next steps for work-up and diagnosis.  She had declined lung biopsy in the past but at this point I do not think we need to proceed with that as the pattern is now in UIP.  Given elevation of double-stranded DNA and ANCA I will refer her to rheumatology but suspicion for any connective tissue disease is low. Discuss case at multidisciplinary conference  Recommended that she go on antifibrotics.  Patient had been reluctant to try these in the past due to concern for liver damage in the setting of cirrhosis.  I have given her literature on these medications to review and she will consider  Health maintenance Does not want to get vaccinations.  Plan/Recommendations: - Ongoing discussion on antifibrotics - Referral to rheumatology - Multidisciplinary ILD conference discussion  Follow-up in 4 weeks.  Marshell Garfinkel MD [facility] 04/11/2019, 11:46 AM  CC: Marin Olp, MD  Addendum: Received rheumatology clinic note from Dr. [facility] dated 04/11/2019. Repeat labs including CK, aldolase, CRP negative, sed rate 40.    No concern for active connective tissue disease.   Marshell Garfinkel MD [facility] 06/22/2019, 1:31 PM

## 2019-04-11 NOTE — Progress Notes (Signed)
SIX MIN WALK 04/11/2019 01/13/2018  Medications Metformin at 0900 none  Supplimental Oxygen during Test? (L/min) No No  Laps 15 11  Partial Lap (in Meters) 0 33  Baseline BP (sitting) 124/88 124/80  Baseline Heartrate 91 89  Baseline Dyspnea (Borg Scale) 1 0  Baseline Fatigue (Borg Scale) 0 0  Baseline SPO2 100 96  BP (sitting) 134/80 152/80  Heartrate 104 131  Dyspnea (Borg Scale) 1 1  Fatigue (Borg Scale) 0 1  SPO2 93 88  BP (sitting) 124/76 138/80  Heartrate 82 95  SPO2 94 95  Stopped or Paused before Six Minutes No No  Distance Completed [phone]  Tech Comments: Desmond Dike, CMA -

## 2019-04-11 NOTE — Progress Notes (Signed)
Phone [phone] In person visit   Subjective:   Heather Townsend is a 73 y.o. year old very pleasant female patient who presents for/with See problem oriented charting Chief Complaint  Patient presents with  . Follow-up  . Diabetes   ROS-no fever/chills/nausea/vomiting  Past Medical History-  Patient Active Problem List   Diagnosis Date Noted  . Pulmonary fibrosis ([place]) 02/14/2019    Priority: High  . Shortness of breath 08/16/2015    Priority: High  . Type 2 diabetes mellitus ([place]) 03/08/2014    Priority: High  . NASH (nonalcoholic steatohepatitis) 04/04/2013    Priority: High  . 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 Left thyroid nodule 11/06/2015    Priority: Medium  . Cervical disc disease 04/16/2014    Priority: Medium  . GERD 09/05/2007    Priority: Medium  . Dyslipidemia 08/16/2007    Priority: Medium  . Vitamin B12 deficiency 01/17/2018    Priority: Low  . Osteopenia 03/07/2014    Priority: Low  . Rash, skin 01/25/2013    Priority: Low  . Chest pain 05/11/2012    Priority: Low  . Renal calculus or stone 04/27/2011    Priority: Low  . Uncontrolled type 2 diabetes mellitus with hyperglycemia ([place]) 02/14/2019  . Trigeminal neuralgia 02/14/2019    Medications- reviewed and updated Current Outpatient Medications  Medication Sig Dispense Refill  . aspirin 81 MG chewable tablet Chew 81 mg by mouth daily as needed for mild pain.    . Cyanocobalamin 1000 MCG/ML KIT Inject 1,000 mg as directed. Pt does self injections twice a month.    . esomeprazole (NEXIUM) 40 MG packet Take 40 mg by mouth daily before breakfast.    . metFORMIN (GLUCOPHAGE) 500 MG tablet Take 1 tablet (500 mg total) by mouth 2 (two) times daily with a meal. 180 tablet 3  . oxyCODONE-acetaminophen (PERCOCET/ROXICET) 5-325 MG per tablet Take 0.5 tablets by mouth every 8 (eight) hours as needed for moderate pain or severe pain.      No current  facility-administered medications for this visit.      Objective:  BP 120/70   Pulse 73   Temp 98 F (36.7 C)   Ht 5' 4.5" (1.638 m)   Wt 163 lb (73.9 kg)   SpO2 96%   BMI 27.55 kg/m  Gen: NAD, resting comfortably behind mask today CV: RRR no murmurs rubs or gallops Lungs: CTAB no crackles, wheeze, rhonchi  Ext: no edema Skin: warm, dry Neuro: Normal gait and speech     Assessment and Plan   # Diabetes S: Compliant with metformin 500 mg twice a day, she thinks metformin is causing the back of her legs and muscles to not feel as strong CBGs-has meter but not checking Exercise and diet-  Some push mowing yard.  She admits to not eating well-she thought the Metformin would be adequate to lower blood sugars Lab Results  Component Value Date   HGBA1C 11.8 (H) 02/14/2019   HGBA1C 7.9 (H) 01/17/2018   HGBA1C 7.1 (H) 05/02/2015    A/P: Long discussion today-I was clear with patient I did not think Metformin alone would be nearly adequate to lower her A1c.  I actually told her she is in a range where I would consider insulin but she is not interested in this.  She feels like she has been able to lower blood sugar in the past but on my review of record have not seen  level is high.  Recommended we at least increase Metformin to 1000 mg twice a day but she declines for now. -She wants to continue Metformin at current dose -She wants to focus on diet and exercise and follow-up in 3 months -Given YouTube videos for diabetes education  #Pulmonary fibrosis-stable to slightly worse and most recent CT.  Pulmonology reviewed chart and had prior p-ANCA positive in April 2017 and referred her to rheumatology.  She saw Dr. Kathlene November and had extensive blood work with plan for 6-week repeat  #NASH-has follow-up with [name] december.  Last set of LFTs improving #Hyperlipidemia-despite diabetes she firmly declines statin despite potential reduced risk of heart attack or stroke Hepatic Function  Latest Ref Rng & Units 02/14/2019 01/17/2018 09/05/2015  Total Protein 6.0 - 8.3 g/dL 7.7 8.3 8.4(H)  Albumin 3.5 - 5.2 g/dL 4.0 4.2 4.3  AST 0 - 37 U/L 75(H) 83(H) 128(H)  ALT 0 - 35 U/L 71(H) 83(H) 118(H)  Alk Phosphatase 39 - 117 U/L 112 97 120(H)  Total Bilirubin 0.2 - 1.2 mg/dL 0.7 0.8 0.6  Bilirubin, Direct 0.0 - 0.3 mg/dL - - -    Recommended follow up: 19-monthfollow-up with A1c at that time-20 care likely to help with management that day Future Appointments  Date Time Provider [place] 07/11/2019  4:20 PM HMarin Olp MD [facility]   Lab/Order associations:   ICD-10-CM   1. Poorly controlled type 2 diabetes mellitus (HCC)  E11.65   2. Dyslipidemia  E78.5   3. NASH (nonalcoholic steatohepatitis)  K75.81   4. Pulmonary fibrosis ([place]  J84.10     No orders of the defined types were placed in this encounter.   Time Stamp The duration of face-to-face time during this visit was greater than 25 minutes. Greater than 50% of this time was spent in counseling, explanation of diagnosis, planning of further management, and/or coordination of care including discussing my concern about adequately lowering her A1c with current regimen, discussing importance of lifestyle changes.     Return precautions advised.  SGarret Reddish MD

## 2019-05-29 ENCOUNTER — Other Ambulatory Visit: Payer: Self-pay | Admitting: Nurse Practitioner

## 2019-05-29 DIAGNOSIS — K7469 Other cirrhosis of liver: Secondary | ICD-10-CM

## 2019-06-13 ENCOUNTER — Ambulatory Visit: Payer: Self-pay | Admitting: Pulmonary Disease

## 2019-06-16 ENCOUNTER — Telehealth: Payer: Self-pay | Admitting: Family Medicine

## 2019-06-16 NOTE — Telephone Encounter (Signed)
I left a message asking the patient to call and schedule Medicare AWV with Loma Sousa ([place]).  If patient calls back, please schedule Medicare Wellness Visit at next available opening. Last AWV 06/23/16 VDM ([place])

## 2019-06-16 NOTE — Progress Notes (Signed)
   Interstitial Lung Disease Multidisciplinary Conference   Heather Townsend    MRN [id number]    DOB 1945/11/19  Primary Care Physician:Hunter, Brayton Mars, MD  Referring Physician: Marshell Garfinkel MD  Time of Conference: 7.30am- 8.30am Date of conference: 06/12/2018 Location of Conference: -  Virtual  Participating Pulmonary: Dr. Brand Males, MD,  Dr Marshell Garfinkel, MD Pathology: Dr Jaquita Folds, MD Radiology: Dr Eddie Candle Others:   Brief History: 74 year old with cirrhosis, hernia with progressive pulmonary fibrosis with strong family history.  Serology:  Positive RNP,intermediate elevation in double-stranded DNA and mild elevation in ANCA of unclear significance.   MDD discussion of CT scan   CT 04/04/2019-basal pattern of pulmonary fibrosis with traction bronchiectasis, honeycombing. Consistent with UIP pattern.  Mild progression since 2016  PFTs:  FVC 2.45 (80%), FEV1 2.18 (93%), F/F 89, TLC 79%, DLCO 53% Mild restriction, moderately severe diffusion defect.   MDD Impression/Recs:  UIP pattern pulmonary fibrosis.  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  Agree with referral to rheumatology  Time Spent in preparation and discussion:  > 30 min   SIGNATURE   Marshell Garfinkel MD [facility] 06/16/2019, 4:41 PM   ...................................................................................................................Marland Kitchen References: Diagnosis of Idiopathic Pulmonary Fibrosis. An Official ATS/ERS/JRS/ALAT Clinical Practice Guideline. Raghu G et al, [place]. 2018 Sep 1;198(5):e44-e68.   IPF Suspected   Histopath ology Pattern      UIP  Probable UIP  Indeterminate for  UIP  Alternative  diagnosis    UIP  IPF  IPF  IPF  Non-IPF dx   HRCT   Probabe UIP  IPF  IPF  IPF (Likely)**  Non-IPF dx  Pattern  Indeterminate for  UIP  IPF  IPF (Likely)**  Indeterminate  for IPF**  Non-IPF dx    Alternative diagnosis  IPF (Likely)**/ non-IPF dx  Non-IPF dx  Non-IPF dx  Non-IPF dx     Idiopathic pulmonary fibrosis diagnosis based upon HRCT and Biopsy paterns.  ** IPF is the likely diagnosis when any of following features are present:  . Moderate-to-severe traction bronchiectasis/bronchiolectasis (defined as mild traction bronchiectasis/bronchiolectasis in four or more lobes including the lingual as a lobe, or moderate to severe traction bronchiectasis in two or more lobes) in a man over age 64 years or in a woman over age 40 years . Extensive (>30%) reticulation on HRCT and an age >70 years  . Increased neutrophils and/or absence of lymphocytosis in BAL fluid  . Multidisciplinary discussion reaches a confident diagnosis of IPF.   **Indeterminate for IPF  . Without an adequate biopsy is unlikely to be IPF  . With an adequate biopsy may be reclassified to a more specific diagnosis after multidisciplinary discussion and/or additional consultation.   dx = diagnosis; HRCT = high-resolution computed tomography; IPF = idiopathic pulmonary fibrosis; UIP = usual interstitial pneumonia.

## 2019-07-04 ENCOUNTER — Other Ambulatory Visit: Payer: Self-pay

## 2019-07-04 ENCOUNTER — Ambulatory Visit
Admission: RE | Admit: 2019-07-04 | Discharge: 2019-07-04 | Disposition: A | Discharge status: Home / Self Care | Payer: Medicare Other | Source: Ambulatory Visit | Attending: Nurse Practitioner | Admitting: Nurse Practitioner

## 2019-07-04 DIAGNOSIS — K7469 Other cirrhosis of liver: Secondary | ICD-10-CM

## 2019-07-04 MED ORDER — GADOBENATE DIMEGLUMINE 529 MG/ML IV SOLN
15.0000 mL | Freq: Once | INTRAVENOUS | Status: AC | PRN
  Administered 2019-07-04: 12:00:00 15 mL via INTRAVENOUS

## 2019-07-11 ENCOUNTER — Ambulatory Visit: Payer: Medicare Other | Admitting: Family Medicine

## 2019-08-30 ENCOUNTER — Telehealth: Payer: Self-pay | Admitting: Family Medicine

## 2019-08-30 NOTE — Telephone Encounter (Signed)
I left a message asking the patient to call and schedule Medicare AWV with Courtney ([facility]).  If patient calls back, please schedule Medicare Wellness Visit (initial) at next available opening.  VDM (Dee-Dee) 

## 2019-10-11 ENCOUNTER — Other Ambulatory Visit: Payer: Self-pay | Admitting: Physician Assistant

## 2019-10-11 DIAGNOSIS — R1012 Left upper quadrant pain: Secondary | ICD-10-CM

## 2019-10-26 ENCOUNTER — Other Ambulatory Visit: Payer: Medicare Other

## 2019-10-26 ENCOUNTER — Ambulatory Visit
Admission: RE | Admit: 2019-10-26 | Discharge: 2019-10-26 | Disposition: A | Discharge status: Home / Self Care | Payer: Medicare Other | Source: Ambulatory Visit | Attending: Physician Assistant | Admitting: Physician Assistant

## 2019-10-26 ENCOUNTER — Other Ambulatory Visit: Payer: Self-pay

## 2019-10-26 DIAGNOSIS — R1012 Left upper quadrant pain: Secondary | ICD-10-CM

## 2019-10-26 MED ORDER — IOPAMIDOL (ISOVUE-300) INJECTION 61%
100.0000 mL | Freq: Once | INTRAVENOUS | Status: AC | PRN
  Administered 2019-10-26: 100 mL via INTRAVENOUS

## 2020-05-29 ENCOUNTER — Other Ambulatory Visit: Payer: Self-pay | Admitting: Family Medicine

## 2020-06-06 NOTE — Progress Notes (Signed)
Phone [phone] In person visit   Subjective:   Heather Townsend is a 74 y.o. year old very pleasant female patient who presents for/with See problem oriented charting Chief Complaint  Patient presents with  . Medication Refill    Metformin    This visit occurred during the SARS-CoV-2 public health emergency.  Safety protocols were in place, including screening questions prior to the visit, additional usage of staff PPE, and extensive cleaning of exam room while observing appropriate contact time as indicated for disinfecting solutions.   Past Medical History-  Patient Active Problem List   Diagnosis Date Noted  . Pulmonary fibrosis (HCC) 02/14/2019    Priority: High  . Shortness of breath 08/16/2015    Priority: High  . Type 2 diabetes mellitus (HCC) 03/08/2014    Priority: High  . NASH (nonalcoholic steatohepatitis) 04/04/2013    Priority: High  . Esophageal varices without bleeding (HCC) 02/14/2019    Priority: Medium  . Left thyroid nodule 11/06/2015    Priority: Medium  . Cervical disc disease 04/16/2014    Priority: Medium  . GERD 09/05/2007    Priority: Medium  . Dyslipidemia 08/16/2007    Priority: Medium  . Vitamin B12 deficiency 01/17/2018    Priority: Low  . Osteopenia 03/07/2014    Priority: Low  . Rash, skin 01/25/2013    Priority: Low  . Chest pain 05/11/2012    Priority: Low  . Renal calculus or stone 04/27/2011    Priority: Low  . Uncontrolled type 2 diabetes mellitus with hyperglycemia (HCC) 02/14/2019  . Trigeminal neuralgia 02/14/2019    Medications- reviewed and updated Current Outpatient Medications  Medication Sig Dispense Refill  . aspirin 81 MG chewable tablet Chew 81 mg by mouth daily as needed for mild pain.    . Cyanocobalamin 1000 MCG/ML KIT Inject 1,000 mg as directed. Pt does self injections twice a month.    . esomeprazole (NEXIUM) 40 MG packet Take 40 mg by mouth 3 (three) times a week.    . Multiple Vitamin (MULTIVITAMIN ADULT  PO) Take by mouth. "One a day"    . oxyCODONE-acetaminophen (PERCOCET/ROXICET) 5-325 MG per tablet Take 0.5 tablets by mouth every 8 (eight) hours as needed for moderate pain or severe pain.     . Probiotic Product (PROBIOTIC PO) Take by mouth.    . metFORMIN (GLUCOPHAGE) 500 MG tablet Take 1 tablet (500 mg total) by mouth 2 (two) times daily with a meal. 180 tablet 3   No current facility-administered medications for this visit.     Objective:  BP (!) 154/86   Pulse 75   Temp (!) 97.2 F (36.2 C) (Temporal)   Ht 5' 5" (1.651 m)   Wt 150 lb 6.4 oz (68.2 kg)   SpO2 95%   BMI 25.03 kg/m  Gen: NAD, resting comfortably CV: RRR no murmurs rubs or gallops Lungs: CTAB no crackles, wheeze, rhonchi Abdomen: soft/nontender/nondistended/normal bowel sounds.  Ext: no edema Skin: warm, dry  Diabetic Foot Exam - Simple   Simple Foot Form Diabetic Foot exam was performed with the following findings: Yes 06/07/2020 10:25 AM  Visual Inspection No deformities, no ulcerations, no other skin breakdown bilaterally: Yes Sensation Testing Intact to touch and monofilament testing bilaterally: Yes Pulse Check Posterior Tibialis and Dorsalis pulse intact bilaterally: Yes Comments        Assessment and Plan   # Diabetes S: Medication: Metformin 500Mg twice daily (taking 3 doses per week actually)-last year I told patient I   was very concerned that Metformin alone would not be adequate to control her A1c-she declined an increase to 1000 mg twice daily or any other intervention.  Plan was for 3-month follow-up but she did not follow-up at that time (was concerned about covid).  I also gave her diabetes education videos to review at that time CBGs- not checking sugar Exercise and diet- down 10 lbs and trying to lose weight- has tried tried to improve diet. Walking 3-4x a day a week- up to an hour at a time Lab Results  Component Value Date   HGBA1C 11.8 (H) 02/14/2019   HGBA1C 7.9 (H) 01/17/2018    HGBA1C 7.1 (H) 05/02/2015  A/P: we will update a1c with labs- she would like to look at other options than metformin- I suspect we may need multiple medicine to help.  -she is worried about UTIs/yeast with jardiance -worried about thyroid issues and pancreatitis with ozempic and hesitant -not interest in insulin -discussed possible endocrine referral depending on results -hopeful lifestyle changes have made a difference  #Pulmonary fibrosis-followed by pulmonology S: Patient was referred to rheumatology in 2020 Dr. Aryal-at April 11, 2019 visit patient was supposed to have 6-week follow-up. Last visit [name] 04/2019. Did not see Dr. Aryal again in follo wup  Breathing has been stable A/P: appears stable- encouraged starting with follow up with [name]   #NASH S: Patient has followed up with [name] of [facility].  Lab Results  Component Value Date   ALT 71 (H) 02/14/2019   AST 75 (H) 02/14/2019   ALKPHOS 112 02/14/2019   BILITOT 0.7 02/14/2019  A/P: Patient with mild elevation of LFTs last visit but was continuing to improve.  She has been hesitant about statins due to LFT elevation -also check INR -encouraged follow up with [name]  #hyperlipidemia/aortic atherosclerosis S: Medication:Patient has firmly declined statin medications despite having diabetes.  She does take aspirin 81 mg for primary prevention Lab Results  Component Value Date   CHOL 171 02/14/2019   HDL 50.20 02/14/2019   LDLCALC 102 (H) 02/14/2019   LDLDIRECT 148.3 11/28/2012   TRIG 93.0 02/14/2019   CHOLHDL 3 02/14/2019   A/P: she firmly declines statin even if once a week low dose and we monitor liver closely.   Aortic atherosclerosis- discussed lowering lipids with medicine- she is not interested   #Esophageal varices without history of bleeding-last endoscopy September 2020-Dr. Stark has not recommended beta-blocker-instead continued surveillance-we discussed today that she is overdue  for follow-up-encouraged her to call to schedule this.  Protonic 3 times a week is likely helpful in reducing  #b12 deficiency- update b12 today-patient does self injections twice a month-may reduce frequency if levels are high Lab Results  Component Value Date   VITAMINB12 611 02/14/2019     #Blood pressure elevation-patient's blood pressure is typically excellent-she took last year off of doctors visits due to stress for multiple visits-I believe going over the number of visits that she needs to update overall health may have been overwhelming-we are going to do some home monitoring before we make any decision on possible blood pressure medicine-also can watch salt in the diet as well as caffeine as sometimes these can be drivers for elevated blood pressure  #Health maintenance-patient has failed to follow-up with bone density, mammogram previously ordered- she agrees to do mammogram. Wants to hold off on dexa  #GERD/central chest pain- when laying down gets sensation of bra strap being tight in middle of   chest for months. No exertional symptoms. Just last for a few minutes then resolves.asked her to monitor and see if related to days that she does not take reflux medicine- possible reflux. -although this could be GERD- she would like to be evaluated by cardiology- referral placed  Recommended follow up: 3 month and 1 day follow up with concern about a1c being high  Lab/Order associations: FASTING   ICD-10-CM   1. Uncontrolled type 2 diabetes mellitus with hyperglycemia (HCC)  E11.65 Microalbumin / creatinine urine ratio    Hemoglobin A1c    CBC with Differential/Platelet    Comprehensive metabolic panel    Lipid panel  2. Secondary esophageal varices without bleeding (HCC) Chronic I85.10   3. Pulmonary fibrosis (HCC) Chronic J84.10   4. Vitamin B12 deficiency  E53.8 Vitamin B12  5. NASH (nonalcoholic steatohepatitis)  K75.81 Comprehensive metabolic panel    Protime-INR   Meds ordered  this encounter  Medications  . metFORMIN (GLUCOPHAGE) 500 MG tablet    Sig: Take 1 tablet (500 mg total) by mouth 2 (two) times daily with a meal.    Dispense:  180 tablet    Refill:  3   Time Spent: 41 minutes of total time (9:32 AM- 10:33 AM) was spent on the date of the encounter performing the following actions: chart review prior to seeing the patient, obtaining history, performing a medically necessary exam, counseling on the treatment plan, placing orders, and documenting in our EHR.   Return precautions advised.  Garret Reddish, MD

## 2020-06-06 NOTE — Patient Instructions (Addendum)
Health Maintenance Due  Topic Date Due   COVID-19 Vaccine (1) - she declines covid vaccine. I would strongly recommend this  Declines flu shot as well as pneumonia shot Never done   MAMMOGRAM Will Schedule  06/28/2016   TETANUS/TDAP patient declined.  07/30/2016   OPHTHALMOLOGY EXAM please call to schedule diabetic eye exam and have them send [place] a copy 06/14/2019  ' Please stop by lab before you go If you have mychart- we will send your results within 3 business days of [place] receiving them.  If you do not have mychart- we will call you about results within 5 business days of [place] receiving them.  *please also note that you will see labs on mychart as soon as they post. I will later go in and write notes on them- will say "notes from Dr. Yong Channel"  Blood pressure slightly high today-with home readings would prefer less than 135/85-please check daily for the next week or 2 and then send [place] an update or call us. Watch salt in diet, continue exercise efforts  We will call you within two weeks about your referral to cardiology. If you do not hear within 3 weeks, give [place] a call.   Recommended follow up: 3 month and 1 day follow up with concern about a1c being high

## 2020-06-07 ENCOUNTER — Encounter: Payer: Self-pay | Admitting: Family Medicine

## 2020-06-07 ENCOUNTER — Ambulatory Visit (INDEPENDENT_AMBULATORY_CARE_PROVIDER_SITE_OTHER): Payer: Medicare Other | Admitting: Family Medicine

## 2020-06-07 ENCOUNTER — Other Ambulatory Visit: Payer: Self-pay

## 2020-06-07 VITALS — BP 154/86 | HR 75 | Temp 97.2°F | Ht 65.0 in | Wt 150.4 lb

## 2020-06-07 DIAGNOSIS — I8510 Secondary esophageal varices without bleeding: Secondary | ICD-10-CM | POA: Diagnosis not present

## 2020-06-07 DIAGNOSIS — R079 Chest pain, unspecified: Secondary | ICD-10-CM | POA: Diagnosis not present

## 2020-06-07 DIAGNOSIS — E1165 Type 2 diabetes mellitus with hyperglycemia: Secondary | ICD-10-CM

## 2020-06-07 DIAGNOSIS — E538 Deficiency of other specified B group vitamins: Secondary | ICD-10-CM | POA: Diagnosis not present

## 2020-06-07 DIAGNOSIS — K7581 Nonalcoholic steatohepatitis (NASH): Secondary | ICD-10-CM

## 2020-06-07 DIAGNOSIS — J8410 Pulmonary fibrosis, unspecified: Secondary | ICD-10-CM | POA: Diagnosis not present

## 2020-06-07 LAB — MICROALBUMIN / CREATININE URINE RATIO
Creatinine,U: 224.6 mg/dL
Microalb Creat Ratio: 1 mg/g (ref 0.0–30.0)
Microalb, Ur: 2.2 mg/dL — ABNORMAL HIGH (ref 0.0–1.9)

## 2020-06-07 LAB — COMPREHENSIVE METABOLIC PANEL
ALT: 88 U/L — ABNORMAL HIGH (ref 0–35)
AST: 100 U/L — ABNORMAL HIGH (ref 0–37)
Albumin: 4.1 g/dL (ref 3.5–5.2)
Alkaline Phosphatase: 112 U/L (ref 39–117)
BUN: 12 mg/dL (ref 6–23)
CO2: 30 mEq/L (ref 19–32)
Calcium: 9.5 mg/dL (ref 8.4–10.5)
Chloride: 101 mEq/L (ref 96–112)
Creatinine, Ser: 0.7 mg/dL (ref 0.40–1.20)
GFR: 86.14 mL/min (ref >60.00)
Glucose, Bld: 230 mg/dL — ABNORMAL HIGH (ref 70–99)
Potassium: 3.8 mEq/L (ref 3.5–5.1)
Sodium: 138 mEq/L (ref 135–145)
Total Bilirubin: 1.1 mg/dL (ref 0.2–1.2)
Total Protein: 7.7 g/dL (ref 6.0–8.3)

## 2020-06-07 LAB — LIPID PANEL
Cholesterol: 179 mg/dL (ref 0–200)
HDL: 47.7 mg/dL (ref >39.00)
LDL Cholesterol: 112 mg/dL — ABNORMAL HIGH (ref 0–99)
NonHDL: 131.19
Total CHOL/HDL Ratio: 4
Triglycerides: 96 mg/dL (ref 0.0–149.0)
VLDL: 19.2 mg/dL (ref 0.0–40.0)

## 2020-06-07 LAB — CBC WITH DIFFERENTIAL/PLATELET
Basophils Absolute: 0 10*3/uL (ref 0.0–0.1)
Basophils Relative: 0.7 % (ref 0.0–3.0)
Eosinophils Absolute: 0.2 10*3/uL (ref 0.0–0.7)
Eosinophils Relative: 4.5 % (ref 0.0–5.0)
HCT: 46.4 % — ABNORMAL HIGH (ref 36.0–46.0)
Hemoglobin: 15.6 g/dL — ABNORMAL HIGH (ref 12.0–15.0)
Lymphocytes Relative: 25.3 % (ref 12.0–46.0)
Lymphs Abs: 1.2 10*3/uL (ref 0.7–4.0)
MCHC: 33.6 g/dL (ref 30.0–36.0)
MCV: 93.6 fl (ref 78.0–100.0)
Monocytes Absolute: 0.4 10*3/uL (ref 0.1–1.0)
Monocytes Relative: 8.3 % (ref 3.0–12.0)
Neutro Abs: 2.9 10*3/uL (ref 1.4–7.7)
Neutrophils Relative %: 61.2 % (ref 43.0–77.0)
Platelets: 106 10*3/uL — ABNORMAL LOW (ref 150.0–400.0)
RBC: 4.96 Mil/uL (ref 3.87–5.11)
RDW: 13.4 % (ref 11.5–15.5)
WBC: 4.7 10*3/uL (ref 4.0–10.5)

## 2020-06-07 LAB — PROTIME-INR
INR: 1.1 ratio — ABNORMAL HIGH (ref 0.8–1.0)
Prothrombin Time: 12 s (ref 9.6–13.1)

## 2020-06-07 LAB — VITAMIN B12: Vitamin B-12: 983 pg/mL — ABNORMAL HIGH (ref 211–911)

## 2020-06-07 LAB — HEMOGLOBIN A1C: Hgb A1c MFr Bld: 10.6 % — ABNORMAL HIGH (ref 4.6–6.5)

## 2020-06-07 MED ORDER — METFORMIN HCL 500 MG PO TABS: 500.0000 mg | ORAL_TABLET | Freq: Two times a day (BID) | ORAL | 3 refills | Status: DC

## 2020-06-27 ENCOUNTER — Encounter: Payer: Self-pay | Admitting: *Deleted

## 2020-06-27 ENCOUNTER — Ambulatory Visit (INDEPENDENT_AMBULATORY_CARE_PROVIDER_SITE_OTHER): Payer: Medicare Other | Admitting: Internal Medicine

## 2020-06-27 ENCOUNTER — Other Ambulatory Visit: Payer: Self-pay

## 2020-06-27 ENCOUNTER — Ambulatory Visit (INDEPENDENT_AMBULATORY_CARE_PROVIDER_SITE_OTHER): Payer: Medicare Other

## 2020-06-27 ENCOUNTER — Encounter: Payer: Self-pay | Admitting: Internal Medicine

## 2020-06-27 VITALS — BP 140/78 | HR 77 | Ht 64.5 in | Wt 153.0 lb

## 2020-06-27 DIAGNOSIS — R079 Chest pain, unspecified: Secondary | ICD-10-CM | POA: Diagnosis not present

## 2020-06-27 DIAGNOSIS — K7460 Unspecified cirrhosis of liver: Secondary | ICD-10-CM | POA: Insufficient documentation

## 2020-06-27 DIAGNOSIS — K7581 Nonalcoholic steatohepatitis (NASH): Secondary | ICD-10-CM

## 2020-06-27 DIAGNOSIS — I10 Essential (primary) hypertension: Secondary | ICD-10-CM

## 2020-06-27 DIAGNOSIS — R002 Palpitations: Secondary | ICD-10-CM

## 2020-06-27 DIAGNOSIS — E119 Type 2 diabetes mellitus without complications: Secondary | ICD-10-CM

## 2020-06-27 DIAGNOSIS — E785 Hyperlipidemia, unspecified: Secondary | ICD-10-CM | POA: Diagnosis not present

## 2020-06-27 NOTE — Patient Instructions (Addendum)
Medication Instructions:  Your provider recommends that you continue on your current medications as directed. Please refer to the Current Medication list given to you today.   *If you need a refill on your cardiac medications before your next appointment, please call your pharmacy*  Testing/Procedures: Dr. Gasper Sells recommends you have a NUCLEAR STRESS TEST. You will need a Covid test prior to that appointment. COVID SCREENING INFORMATION: Pre-Procedural COVID-19 Testing Site [phone] [place], [place] [address] You will need to go home after your screening and quarantine until your procedure.  Dr. Gasper Sells recommends you wear a monitor for 14 days.  Follow-Up: At [facility], you and your health needs are our priority.  As part of our continuing mission to provide you with exceptional heart care, we have created designated Provider Care Teams.  These Care Teams include your primary Cardiologist (physician) and Advanced Practice Providers (APPs -  Physician Assistants and Nurse Practitioners) who all work together to provide you with the care you need, when you need it. Your next appointment:   3 month(s) The format for your next appointment:   In Person Provider:   You may see Dr. Dennison Bulla or one of the following Advanced Practice Providers on your designated Care Team:    Melina Copa, PA-C  Ermalinda Barrios, PA-C   [place] Monitor Instructions   Your physician has requested you wear your ZIO patch monitor 14 days.   This is a single patch monitor.  [employer] supplies one patch monitor per enrollment.  Additional stickers are not available.   Please do not apply patch if you will be having a Nuclear Stress Test, Echocardiogram, Cardiac CT, MRI, or Chest Xray during the time frame you would be wearing the monitor. The patch cannot be worn during these tests.  You cannot remove and re-apply the ZIO XT patch monitor.   Your ZIO patch monitor will be sent USPS  Priority mail from [facility] directly to your home address. The monitor may also be mailed to a PO BOX if home delivery is not available.   It may take 3-5 days to receive your monitor after you have been enrolled.   Once you have received you monitor, please review enclosed instructions.  Your monitor has already been registered assigning a specific monitor serial # to you.   Applying the monitor   Shave hair from upper left chest.   Hold abrader disc by orange tab.  Rub abrader in 40 strokes over left upper chest as indicated in your monitor instructions.   Clean area with 4 enclosed alcohol pads .  Use all pads to assure are is cleaned thoroughly.  Let dry.   Apply patch as indicated in monitor instructions.  Patch will be place under collarbone on left side of chest with arrow pointing upward.   Rub patch adhesive wings for 2 minutes.Remove white label marked "1".  Remove white label marked "2".  Rub patch adhesive wings for 2 additional minutes.   While looking in a mirror, press and release button in center of patch.  A small green light will flash 3-4 times .  This will be your only indicator the monitor has been turned on.     Do not shower for the first 24 hours.  You may shower after the first 24 hours.   Press button if you feel a symptom. You will hear a small click.  Record Date, Time and Symptom in the Patient Log Book.  When you are ready to remove patch, follow instructions on last 2 pages of Patient Log Book.  Stick patch monitor onto last page of Patient Log Book.   Place Patient Log Book in [place] box.  Use locking tab on box and tape box closed securely.  The [employer] and [employer] has [employer] on it.  Please place in mailbox as soon as possible.  Your physician should have your test results approximately 7 days after the monitor has been mailed back to [facility].   Call [facility] at [phone] if you have questions regarding  your ZIO XT patch monitor.  Call them immediately if you see an orange light blinking on your monitor.   If your monitor falls off in less than 4 days contact our Monitor department at [phone].  If your monitor becomes loose or falls off after 4 days call [employer] at [phone] for suggestions on securing your monitor.

## 2020-06-27 NOTE — Progress Notes (Signed)
Cardiology Office Note:    Date:  06/27/2020   ID:  Heather Townsend, Heather Townsend 04/30/1946, MRN [id number]  PCP:  Shelva Majestic, MD  [facility] Cardiologist:  No primary care provider on file.  [facility] Electrophysiologist:  None   CC: chest pain in her backbone Consulted for the evaluation of chest pain at the behest of Shelva Majestic, MD  History of Present Illness:    Heather Townsend is a 75 y.o. female with a hx of DM with HLD. Aortic atherosclerosis, NASH with Cirrhosis (L[facility]), ILD NOS, MVP who presented for evaluation 06/27/20.  Patient notes that she is feeling constant chest pain.   Discomfort occurs with independent of activity, worsens with position or activity; and improves spontaneously.  Patient exertion notable for walking her dog two to three times a week for at least 20-30 minutes and feels no symptoms.  No shortness of breath at rest, DOE unless walking up a hill.  No PND or orthopnea.  No bendopnea, weight gain, leg swelling , or abdominal swelling.   Notes no palpitations or funny heart beats since 2002 (heart beats with low potassium) (had 2002 heart monitor NOS) until recently Now is having different heart poundings that improve with her holding her breath.  She has these three times in the last month.  Can always break it with holding her breath.  No syncope or near syncope.  Ambulatory BP 130/70.   Past Medical History:  Diagnosis Date  . Allergy   . Cirrhosis (HCC)   . Diabetes mellitus without complication (HCC)   . Diverticulosis   . DIVERTICULOSIS, COLON 01/03/2007  . Fatty infiltration of liver   . GERD (gastroesophageal reflux disease)   . Hepatitis, chronic active (HCC)    NASH  . Hiatal hernia   . HIATAL HERNIA 12/07/2006  . Hyperlipidemia   . Interstitial lung disease (HCC)   . Low back pain   . 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    Past Surgical History:  Procedure Laterality Date  . APPENDECTOMY    . EYE  SURGERY     macular hole  . ganglion cysts     left hand  . HAND SURGERY  1996  . SHOULDER SURGERY  2006   rt shoulder  . TONSILLECTOMY    . TUBAL LIGATION    . UTERINE SUSPENSION      Current Medications: Current Meds  Medication Sig  . aspirin 81 MG chewable tablet Chew 81 mg by mouth daily as needed for mild pain.  . Cyanocobalamin 1000 MCG/ML KIT Inject 1,000 mg as directed. Pt does self injections twice a month.  . esomeprazole (NEXIUM) 40 MG packet Take 40 mg by mouth 3 (three) times a week.  . metFORMIN (GLUCOPHAGE) 500 MG tablet Take 1 tablet (500 mg total) by mouth 2 (two) times daily with a meal.  . Multiple Vitamin (MULTIVITAMIN ADULT PO) Take by mouth. "One a day"  . oxyCODONE-acetaminophen (PERCOCET/ROXICET) 5-325 MG per tablet Take 0.5 tablets by mouth every 8 (eight) hours as needed for moderate pain or severe pain.   . Probiotic Product (PROBIOTIC PO) Take by mouth.     Allergies:   Celecoxib, Codeine, Ibuprofen, Meloxicam, Nsaids, Rofecoxib, Sulfa antibiotics, and Sulfonamide derivatives   Social History   Socioeconomic History  . Marital status: Divorced    Spouse name: Not on file  . Number of children: Not on file  . Years of  education: Not on file  . Highest education level: Not on file  Occupational History  . Occupation: Glass blower/designer  Tobacco Use  . Smoking status: Former Smoker    Packs/day: 0.75    Years: 15.00    Pack years: 11.25    Types: Cigarettes    Quit date: 06/01/1986    Years since quitting: 34.0  . Smokeless tobacco: Never Used  Vaping Use  . Vaping Use: Never used  Substance and Sexual Activity  . Alcohol use: Not Currently    Alcohol/week: 0.0 standard drinks    Comment: rare  . Drug use: No  . Sexual activity: Not Currently  Other Topics Concern  . Not on file  Social History Narrative   Divorced. Long term boyfriend passed 2018.  Lives alone. 2 children (son in [place], daughter in [place]). 1 granddaughter in [place].        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    Caregiving for 3 people as of 2020   Social Determinants of Health   Financial Resource Strain: Not on file  Food Insecurity: Not on file  Transportation Needs: Not on file  Physical Activity: Not on file  Stress: Not on file  Social Connections: Not on file     Family History: The patient's family history includes Cancer in her maternal aunt; Colon cancer in her paternal uncle; Diabetes in her mother; Esophageal cancer in her maternal aunt and maternal uncle; Heart disease in her mother; Pancreatic cancer in her paternal uncle; Stomach cancer in her maternal aunt, maternal uncle, and paternal uncle. There is no history of Rectal cancer.  History of coronary artery disease notable for no members. History of heart failure notable for mother. History of arrhythmia notable for atrial fibrillation in mother and sisters.  ROS:   Please see the history of present illness.    Notes blue toes left foot on occasion (has resolved)  All other systems reviewed and are negative.  EKGs/Labs/Other Studies Reviewed:    The following studies were reviewed today:  EKG:   06/27/20: SR rate 77 with nonspecific TWI  Transthoracic Echocardiogram: Date: 08/12/2007 Results: normal biventricular function SUMMARY  - Overall left ventricular systolic function was normal. Left     ventricular ejection fraction was estimated to be 65 %. There     were no left ventricular regional wall motion abnormalities.  - The left atrium was mildly dilated.    NonCardiac CT: Date: 10/26/19 Results:Aortic root atherosclerosis without coronary artery calcification and with notable hepatomegaly  NM Stress Testing : Date: 12/12006 Results: static perfusion imaging normal  Stress Echo: Date:05/31/2012 Results: No resting or exercise WMAs   Recent Labs: 06/07/2020: ALT 88; BUN 12; Creatinine, Ser  0.70; Hemoglobin 15.6; Platelets 106.0; Potassium 3.8; Sodium 138  Recent Lipid Panel    Component Value Date/Time   CHOL 179 06/07/2020 1045   TRIG 96.0 06/07/2020 1045   HDL 47.70 06/07/2020 1045   CHOLHDL 4 06/07/2020 1045   VLDL 19.2 06/07/2020 1045   LDLCALC 112 (H) 06/07/2020 1045   LDLDIRECT 148.3 11/28/2012 0905     Risk Assessment/Calculations:     N/A  Physical Exam:    VS:  BP 140/78   Pulse 77   Ht 5' 4.5" (1.638 m)   Wt 153 lb (69.4 kg)   SpO2 96%   BMI 25.86 kg/m     Wt Readings from Last 3 Encounters:  06/27/20 153 lb (69.4 kg)  06/07/20 150 lb 6.4 oz (68.2 kg)  04/11/19 163 lb (73.9 kg)    GEN:  Well nourished, well developed in no acute distress HEENT: Normal NECK: No JVD; No carotid bruits LYMPHATICS: No lymphadenopathy CARDIAC: RRR, no murmurs, rubs, gallops RESPIRATORY:  Good air movement course breath sounds bilaterally ABDOMEN: Soft, non-tender, non-distended MUSCULOSKELETAL:  No edema; No deformity no blue toes left foot SKIN: Warm and dry NEUROLOGIC:  Alert and oriented x 3 PSYCHIATRIC:  Normal affect   ASSESSMENT:    1. Chest pain of uncertain etiology   2. Essential hypertension   3. Hyperlipidemia, unspecified hyperlipidemia type   4. Palpitations   5. NASH (nonalcoholic steatohepatitis)   6. Cirrhosis of liver without ascites, unspecified hepatic cirrhosis type (HCC)   7. Chest pain, unspecified type   8. Diabetes mellitus with coincident hypertension (HCC)    PLAN:    Chest Pain Syndrome  - The patient presents with possibly cardiac - Would recommend exercise nuclear medicine stress test (NPO at midnight); discussed risks, benefits, and alternatives of the diagnostic procedure including chest pain, arrhythmia, and death.  Patient amenable for testing.  Palpitations; possible SVT - will obtain 14-day non live heart monitor (ZioPatch) - AV Nodal Therapy: will decide on BB based on results  Diabetes with Hypertension -  ambulatory blood pressure 132/74, will continue ambulatory BP monitoring; gave education on how to perform ambulatory blood pressure monitoring including the frequency and technique; goal ambulatory blood pressure < 135/85 on average - after results will decided type of BB based on ambulatory BP - discussed diet (DASH/low sodium), and exercise/weight loss interventions   Hyperlipidemia (mixed) Aortic Atherosclerosis Cirrhosis -LDL goal less than 70 - will confirm with her GI MD and if in agreement would start rosuvastatin 10 mg PO daily (patient presently hesitant) - gave education on dietary changes   Shared Decision Making/Informed Consent The risks [chest pain, shortness of breath, cardiac arrhythmias, dizziness, blood pressure fluctuations, myocardial infarction, stroke/transient ischemic attack, nausea, vomiting, allergic reaction, radiation exposure, metallic taste sensation and life-threatening complications (estimated to be 1 in 10,000)], benefits (risk stratification, diagnosing coronary artery disease, treatment guidance) and alternatives of a nuclear stress test were discussed in detail with Ms. Netzley and she agrees to proceed.     Medication Adjustments/Labs and Tests Ordered: Current medicines are reviewed at length with the patient today.  Concerns regarding medicines are outlined above.  Orders Placed This Encounter  Procedures  . Cardiac Stress Test: Informed Consent Details: Physician/Practitioner Attestation; Transcribe to consent form and obtain patient signature  . LONG TERM MONITOR (3-14 DAYS)  . MYOCARDIAL PERFUSION IMAGING  . EKG 12-Lead   No orders of the defined types were placed in this encounter.   There are no Patient Instructions on file for this visit.   Signed, Werner Lean, MD  06/27/2020 10:26 AM    [facility]

## 2020-06-27 NOTE — Progress Notes (Signed)
Patient ID: Heather Townsend, female   DOB: [birth year], 75 y.o.   MRN: [id number] Patient enrolled for [employer] to ship a 14 day ZIO XT long term holter monitor to [address], [place], [place]  [address].

## 2020-06-28 ENCOUNTER — Telehealth: Payer: Self-pay

## 2020-06-28 NOTE — Telephone Encounter (Signed)
Patient is calling in asking for a call back about lab results.

## 2020-07-01 NOTE — Telephone Encounter (Signed)
Returned pt call and was unsuccessful, will have [employer].

## 2020-07-01 NOTE — Telephone Encounter (Signed)
Patient is returning Keba's call about lab results. Asked if the results could be mailed out to [address]

## 2020-07-01 NOTE — Telephone Encounter (Signed)
Called and lm on pt vm tcb. 

## 2020-07-02 ENCOUNTER — Telehealth (HOSPITAL_COMMUNITY): Payer: Self-pay | Admitting: *Deleted

## 2020-07-02 NOTE — Telephone Encounter (Signed)
Patient given detailed instructions per Myocardial Perfusion Study Information Sheet for the test on 07/05/20. Patient notified to arrive 15 minutes early and that it is imperative to arrive on time for appointment to keep from having the test rescheduled.  If you need to cancel or reschedule your appointment, please call the office within 24 hours of your appointment. . Patient verbalized understanding. Kirstie Peri

## 2020-07-03 ENCOUNTER — Other Ambulatory Visit (HOSPITAL_COMMUNITY): Payer: Medicare Other

## 2020-07-04 DIAGNOSIS — R002 Palpitations: Secondary | ICD-10-CM | POA: Diagnosis not present

## 2020-07-05 ENCOUNTER — Encounter (HOSPITAL_COMMUNITY): Payer: Medicare Other

## 2020-07-08 ENCOUNTER — Telehealth (HOSPITAL_COMMUNITY): Payer: Self-pay | Admitting: *Deleted

## 2020-07-08 ENCOUNTER — Other Ambulatory Visit (HOSPITAL_COMMUNITY)
Admission: RE | Admit: 2020-07-08 | Discharge: 2020-07-08 | Disposition: A | Discharge status: Home / Self Care | Payer: Medicare Other | Source: Ambulatory Visit | Attending: Internal Medicine | Admitting: Internal Medicine

## 2020-07-08 DIAGNOSIS — Z01812 Encounter for preprocedural laboratory examination: Secondary | ICD-10-CM | POA: Diagnosis not present

## 2020-07-08 DIAGNOSIS — Z20822 Contact with and (suspected) exposure to covid-19: Secondary | ICD-10-CM | POA: Diagnosis not present

## 2020-07-08 NOTE — Telephone Encounter (Signed)
Attempted to call patient regarding upcoming appt, no answer, unable to leave a message.  Heather Townsend

## 2020-07-08 NOTE — Telephone Encounter (Signed)
Patient is returning call.  °

## 2020-07-09 LAB — SARS CORONAVIRUS 2 (TAT 6-24 HRS): SARS Coronavirus 2: NEGATIVE

## 2020-07-11 ENCOUNTER — Other Ambulatory Visit: Payer: Self-pay

## 2020-07-11 ENCOUNTER — Ambulatory Visit (HOSPITAL_COMMUNITY): Payer: Medicare Other | Attending: Cardiology

## 2020-07-11 DIAGNOSIS — R079 Chest pain, unspecified: Secondary | ICD-10-CM | POA: Diagnosis not present

## 2020-07-11 LAB — MYOCARDIAL PERFUSION IMAGING
LV dias vol: 38 mL (ref 46–106)
LV sys vol: 10 mL
SDS: 1
SRS: 0
SSS: 1
TID: 0.77

## 2020-07-11 MED ORDER — REGADENOSON 0.4 MG/5ML IV SOLN: 0.4000 mg | Freq: Once | INTRAVENOUS | Status: DC

## 2020-07-11 MED ORDER — TECHNETIUM TC 99M TETROFOSMIN IV KIT
30.5000 | PACK | Freq: Once | INTRAVENOUS | Status: AC | PRN
  Administered 2020-07-11: 30.5 via INTRAVENOUS
  Filled 2020-07-11: qty 31

## 2020-07-11 MED ORDER — TECHNETIUM TC 99M TETROFOSMIN IV KIT
10.9000 | PACK | Freq: Once | INTRAVENOUS | Status: AC | PRN
  Administered 2020-07-11: 10.9 via INTRAVENOUS
  Filled 2020-07-11: qty 11

## 2020-07-12 DIAGNOSIS — M79672 Pain in left foot: Secondary | ICD-10-CM | POA: Diagnosis not present

## 2020-07-12 DIAGNOSIS — S9032XA Contusion of left foot, initial encounter: Secondary | ICD-10-CM | POA: Diagnosis not present

## 2020-07-15 ENCOUNTER — Ambulatory Visit (INDEPENDENT_AMBULATORY_CARE_PROVIDER_SITE_OTHER): Payer: Medicare Other

## 2020-07-15 DIAGNOSIS — Z1231 Encounter for screening mammogram for malignant neoplasm of breast: Secondary | ICD-10-CM

## 2020-07-15 DIAGNOSIS — Z0000 Encounter for general adult medical examination without abnormal findings: Secondary | ICD-10-CM

## 2020-07-15 NOTE — Progress Notes (Signed)
Virtual Visit via Telephone Note  I connected with  Heather Townsend on 07/15/20 at 11:00 AM EST by telephone and verified that I am speaking with the correct person using two identifiers.  Medicare Annual Wellness visit completed telephonically due to Covid-19 pandemic.   Persons participating in this call: This Health Coach and this patient.   Location: Patient: Home Provider: Office   I discussed the limitations, risks, security and privacy concerns of performing an evaluation and management service by telephone and the availability of in person appointments. The patient expressed understanding and agreed to proceed.  Unable to perform video visit due to video visit attempted and failed and/or patient does not have video capability.   Some vital signs may be absent or patient reported.   Willette Brace, LPN    Subjective:   Heather Townsend is a 75 y.o. female who presents for Medicare Annual (Subsequent) preventive examination.  Review of Systems     Cardiac Risk Factors include: advanced age (>75mn, >>19women);diabetes mellitus;hypertension;dyslipidemia     Objective:    There were no vitals filed for this visit. There is no height or weight on file to calculate BMI.  Advanced Directives 07/15/2020 01/13/2019 08/13/2018 08/22/2016 06/23/2016 12/07/2012  Does Patient Have a Medical Advance Directive? _0  Patient does not have advance directive  Would patient like information on creating a medical advance directive? No - Patient declined - - - - -  Pre-existing out of facility DNR order (yellow form or pink MOST form) - - - - - No    Current Medications (verified) Outpatient Encounter Medications as of 07/15/2020  Medication Sig   aspirin 81 MG chewable tablet Chew 81 mg by mouth daily as needed for mild pain.   Cyanocobalamin 1000 MCG/ML KIT Inject 1,000 mg as directed. Pt does self injections twice a month.   esomeprazole (NEXIUM) 40 MG packet Take 40 mg by  mouth 3 (three) times a week.   metFORMIN (GLUCOPHAGE) 500 MG tablet Take 1 tablet (500 mg total) by mouth 2 (two) times daily with a meal.   Multiple Vitamin (MULTIVITAMIN ADULT PO) Take by mouth. "One a day"   oxyCODONE-acetaminophen (PERCOCET/ROXICET) 5-325 MG per tablet Take 0.5 tablets by mouth every 8 (eight) hours as needed for moderate pain or severe pain.    Probiotic Product (PROBIOTIC PO) Take by mouth.   Facility-Administered Encounter Medications as of 07/15/2020  Medication   regadenoson (LEXISCAN) injection SOLN 0.4 mg    Allergies (verified) Celecoxib, Codeine, Ibuprofen, Meloxicam, Nsaids, Rofecoxib, Sulfa antibiotics, and Sulfonamide derivatives   History: Past Medical History:  Diagnosis Date   Allergy    Cirrhosis ([place]    Diabetes mellitus without complication ([facility]    Diverticulosis    DIVERTICULOSIS, COLON 01/03/2007   Fatty infiltration of liver    GERD (gastroesophageal reflux disease)    Hepatitis, chronic active ([place]    NASH   Hiatal hernia    HIATAL HERNIA 12/07/2006   Hyperlipidemia    Interstitial lung disease ([place]    Low back pain    Tuberculosis    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   Past Surgical History:  Procedure Laterality Date   APPENDECTOMY     EYE SURGERY     macular hole   ganglion cysts     left hand   HAND SURGERY  1996   SHOULDER SURGERY  2006   rt shoulder   TONSILLECTOMY  TUBAL LIGATION     UTERINE SUSPENSION     Family History  Problem Relation Age of Onset   Heart disease Mother    Diabetes Mother    Pancreatic cancer Paternal Uncle    Colon cancer Paternal Uncle    Stomach cancer Paternal Uncle    Cancer Maternal Aunt        Stomach   Esophageal cancer Maternal Aunt    Stomach cancer Maternal Aunt    Esophageal cancer Maternal Uncle    Stomach cancer Maternal Uncle    Rectal cancer Neg Hx    Social History   Socioeconomic History   Marital status: Divorced     Spouse name: Not on file   Number of children: Not on file   Years of education: Not on file   Highest education level: Not on file  Occupational History   Occupation: Glass blower/designer   Occupation: care giver  Tobacco Use   Smoking status: Former Smoker    Packs/day: 0.75    Years: 15.00    Pack years: 11.25    Types: Cigarettes    Quit date: 06/01/1986    Years since quitting: 34.1   Smokeless tobacco: Never Used  Vaping Use   Vaping Use: Never used  Substance and Sexual Activity   Alcohol use: Not Currently    Alcohol/week: 0.0 standard drinks    Comment: rare   Drug use: No   Sexual activity: Not Currently  Other Topics Concern   Not on file  Social History Narrative   Divorced. Long term boyfriend passed 2018.  Lives alone. 2 children (son in [place], daughter in [place]). 1 granddaughter in [place].       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    Caregiving for 3 people as of 2020   Social Determinants of Health   Financial Resource Strain: Low Risk    Difficulty of Paying Living Expenses: Not hard at all  Food Insecurity: No Food Insecurity   Worried About Charity fundraiser in the Last Year: Never true   Arboriculturist in the Last Year: Never true  Transportation Needs: No Transportation Needs   Lack of Transportation (Medical): No   Lack of Transportation (Non-Medical): No  Physical Activity: Insufficiently Active   Days of Exercise per Week: 4 days   Minutes of Exercise per Session: 20 min  Stress: No Stress Concern Present   Feeling of Stress : Only a little  Social Connections: Moderately Integrated   Frequency of Communication with Friends and Family: More than three times a week   Frequency of Social Gatherings with Friends and Family: More than three times a week   Attends Religious Services: 1 to 4 times per year   Active Member of [employer] or Organizations: Yes   Attends  Archivist Meetings: 1 to 4 times per year   Marital Status: Divorced    Tobacco Counseling Counseling given: Not Answered   Clinical Intake:  Pre-visit preparation completed: Yes  Pain : No/denies pain     BMI - recorded: 25.86 Nutritional Status: BMI 25 -29 Overweight Nutritional Risks: None Diabetes: Yes CBG done?: No Did pt. bring in CBG monitor from home?: No  How often do you need to have someone help you when you read instructions, pamphlets, or other written materials from your doctor or pharmacy?: 1 - Never  Diabetic?Nutrition Risk Assessment:  Has the patient had any N/V/D within the  last 2 months?  No  Does the patient have any non-healing wounds?  No  Has the patient had any unintentional weight loss or weight gain?  No   Diabetes:  Is the patient diabetic?  Yes  If diabetic, was a CBG obtained today?  No  Did the patient bring in their glucometer from home?  No  How often do you monitor your CBG's? N/A.   Financial Strains and Diabetes Management:  Are you having any financial strains with the device, your supplies or your medication? No .  Does the patient want to be seen by Chronic Care Management for management of their diabetes?  No  Would the patient like to be referred to a Nutritionist or for Diabetic Management?  No   Diabetic Exams:  Diabetic Eye Exam: Overdue for diabetic eye exam. Pt has been advised about the importance in completing this exam. Patient advised to call and schedule an eye exam. Diabetic Foot Exam: Completed 06/07/20   Interpreter Needed?: No  Information entered by :: Charlott Rakes, LPN   Activities of Daily Living In your present state of health, do you have any difficulty performing the following activities: 07/15/2020  Hearing? N  Vision? N  Difficulty concentrating or making decisions? N  Walking or climbing stairs? N  Comment foot injury  Dressing or bathing? N  Doing errands, shopping? N  Preparing  Food and eating ? N  Using the Toilet? N  In the past six months, have you accidently leaked urine? N  Do you have problems with loss of bowel control? N  Managing your Medications? N  Managing your Finances? N  Housekeeping or managing your Housekeeping? N  Some recent data might be hidden    Patient Care Team: Marin Olp, MD as PCP - General (Family Medicine) Werner Lean, MD as PCP - Cardiology (Cardiology)  Indicate any recent Medical Services you may have received from other than [facility] providers in the past year (date may be approximate).     Assessment:   This is a routine wellness examination for [place].  Hearing/Vision screen  Hearing Screening   _0  _1  _2  _3  _4  _5  _6  _7  _8   Right ear:           Left ear:           Comments: Pt denies any hearing issues   Vision Screening Comments: Pt follows up with the provider in [place] bi annually for eye exams  Dietary issues and exercise activities discussed: Current Exercise Habits: Home exercise routine, Type of exercise: walking;Other - see comments (dancing class), Time (Minutes): > 60, Frequency (Times/Week): 3, Weekly Exercise (Minutes/Week): 0  Goals     Patient Stated     Lose 5lbs       Depression Screen PHQ 2/9 Scores 07/15/2020 06/07/2020 04/11/2019 02/14/2019 01/17/2018 06/23/2016 05/02/2015  PHQ - 2 Score 0 0 0 0 0 0 0  PHQ- 9 Score - 1 - - - - -    Fall Risk Fall Risk  07/15/2020 06/07/2020 02/14/2019 01/17/2018 12/31/2017  Falls in the past year? 1 0 0 No No  Comment - - - - Emmi Telephone Survey: data to providers prior to load  Number falls in past yr: 1 0 0 - -  Injury with Fall? 1 0 0 - -  Comment foot injury - - - -  Risk for fall due to : Impaired mobility;Impaired balance/gait;Impaired vision - - - -  Risk for fall due  to: Comment foot injury - - - -    FALL RISK PREVENTION PERTAINING TO THE HOME:  Any stairs in or around the home? Yes  If so, are there  any without handrails? Yes  Home free of loose throw rugs in walkways, pet beds, electrical Townsend, etc? Yes  Adequate lighting in your home to reduce risk of falls? Yes   ASSISTIVE DEVICES UTILIZED TO PREVENT FALLS:  Life alert? No  Use of a cane, walker or w/c? No  Grab bars in the bathroom? No  Shower chair or bench in shower? No  Elevated toilet seat or a handicapped toilet? Yes   TIMED UP AND GO:  Was the test performed? No .      Cognitive Function:     6CIT Screen 07/15/2020  What Year? 0 points  What month? 0 points  Count back from 20 0 points  Months in reverse 0 points  Repeat phrase 0 points    Immunizations Immunization History  Administered Date(s) Administered   Td 07/31/2006    TDAP status: Due, Education has been provided regarding the importance of this vaccine. Advised may receive this vaccine at local pharmacy or Health Dept. Aware to provide a copy of the vaccination record if obtained from local pharmacy or Health Dept. Verbalized acceptance and understanding.  Flu Vaccine status: Declined, Education has been provided regarding the importance of this vaccine but patient still declined. Advised may receive this vaccine at local pharmacy or Health Dept. Aware to provide a copy of the vaccination record if obtained from local pharmacy or Health Dept. Verbalized acceptance and understanding.  Pneumococcal vaccine status: Declined,  Education has been provided regarding the importance of this vaccine but patient still declined. Advised may receive this vaccine at local pharmacy or Health Dept. Aware to provide a copy of the vaccination record if obtained from local pharmacy or Health Dept. Verbalized acceptance and understanding.   Covid-19 vaccine status: Declined, Education has been provided regarding the importance of this vaccine but patient still declined. Advised may receive this vaccine at local pharmacy or Health Dept.or vaccine clinic. Aware to  provide a copy of the vaccination record if obtained from local pharmacy or Health Dept. Verbalized acceptance and understanding.  Qualifies for Shingles Vaccine? Yes   Zostavax completed No   Shingrix Completed?: No.    Education has been provided regarding the importance of this vaccine. Patient has been advised to call insurance company to determine out of pocket expense if they have not yet received this vaccine. Advised may also receive vaccine at local pharmacy or Health Dept. Verbalized acceptance and understanding.  Screening Tests Health Maintenance  Topic Date Due   MAMMOGRAM  06/28/2016   TETANUS/TDAP  07/30/2016   OPHTHALMOLOGY EXAM  06/14/2019   INFLUENZA VACCINE  08/29/2020 (Originally 12/31/2019)   HEMOGLOBIN A1C  12/05/2020   FOOT EXAM  06/07/2021   URINE MICROALBUMIN  06/07/2021   COLONOSCOPY (Pts 45-55yr Insurance coverage will need to be confirmed)  02/28/2024   DEXA SCAN  Completed   Hepatitis C Screening  Completed   COVID-19 Vaccine  Discontinued   PNA vac Low Risk Adult  Discontinued    Health Maintenance  Health Maintenance Due  Topic Date Due   MAMMOGRAM  06/28/2016   TETANUS/TDAP  07/30/2016   OPHTHALMOLOGY EXAM  06/14/2019    Colorectal cancer screening: Type of screening: Colonoscopy. Completed 02/28/19. Repeat every 5 years  Mammogram status: Ordered 07/15/20. Pt provided with contact info and advised  to call to schedule appt.   Bone Density status: Completed 08/22/14. Results reflect: Bone density results: OSTEOPENIA. Repeat every 2 years.   Additional Screening:  Hepatitis C Screening:  Completed 3/31/8  Vision Screening: Recommended annual ophthalmology exams for early detection of glaucoma and other disorders of the eye. Is the patient up to date with their annual eye exam?  No  Who is the provider or what is the name of the office in which the patient attends annual eye exams? Pt will make an appt with provider in [place] If  pt is not established with a provider, would they like to be referred to a provider to establish care? No .   Dental Screening: Recommended annual dental exams for proper oral hygiene  Community Resource Referral / Chronic Care Management: CRR required this visit?  No   CCM required this visit?  No      Plan:     I have personally reviewed and noted the following in the patients chart:    Medical and social history  Use of alcohol, tobacco or illicit drugs   Current medications and supplements  Functional ability and status  Nutritional status  Physical activity  Advanced directives  List of other physicians  Hospitalizations, surgeries, and ER visits in previous 12 months  Vitals  Screenings to include cognitive, depression, and falls  Referrals and appointments  In addition, I have reviewed and discussed with patient certain preventive protocols, quality metrics, and best practice recommendations. A written personalized care plan for preventive services as well as general preventive health recommendations were provided to patient.     Willette Brace, LPN   [id number]   Nurse Notes: None

## 2020-07-15 NOTE — Patient Instructions (Signed)
[name] , Thank you for taking time to come for your Medicare Wellness Visit. I appreciate your ongoing commitment to your health goals. Please review the following plan we discussed and let me know if I can assist you in the future.   Screening recommendations/referrals: Colonoscopy: Done 02/28/19 Mammogram: order placed today Bone Density: Done 08/22/14 Recommended yearly ophthalmology/optometry visit for glaucoma screening and checkup Recommended yearly dental visit for hygiene and checkup  Vaccinations: Influenza vaccine: Declined and discussed  Pneumococcal vaccine: Declined and discussed Tdap vaccine: due and discussed Shingles vaccine: Declined and Shingrix discussed. Please contact your pharmacy for coverage information.   Covid-19:Declined and discussed  Advanced directives: Advance directive discussed with you today. Even though you declined this today please call our office should you change your mind and we can give you the proper paperwork for you to fill out.  Conditions/risks identified: lose 5 lbs  Next appointment: Follow up in one year for your annual wellness visit    Preventive Care 65 Years and Older, Female Preventive care refers to lifestyle choices and visits with your health care provider that can promote health and wellness. What does preventive care include?  A yearly physical exam. This is also called an annual well check.  Dental exams once or twice a year.  Routine eye exams. Ask your health care provider how often you should have your eyes checked.  Personal lifestyle choices, including:  Daily care of your teeth and gums.  Regular physical activity.  Eating a healthy diet.  Avoiding tobacco and drug use.  Limiting alcohol use.  Practicing safe sex.  Taking low-dose aspirin every day.  Taking vitamin and mineral supplements as recommended by your health care provider. What happens during an annual well check? The services and  screenings done by your health care provider during your annual well check will depend on your age, overall health, lifestyle risk factors, and family history of disease. Counseling  Your health care provider may ask you questions about your:  Alcohol use.  Tobacco use.  Drug use.  Emotional well-being.  Home and relationship well-being.  Sexual activity.  Eating habits.  History of falls.  Memory and ability to understand (cognition).  Work and work Statistician.  Reproductive health. Screening  You may have the following tests or measurements:  Height, weight, and BMI.  Blood pressure.  Lipid and cholesterol levels. These may be checked every 5 years, or more frequently if you are over 44 years old.  Skin check.  Lung cancer screening. You may have this screening every year starting at age 73 if you have a 30-pack-year history of smoking and currently smoke or have quit within the past 15 years.  Fecal occult blood test (FOBT) of the stool. You may have this test every year starting at age 11.  Flexible sigmoidoscopy or colonoscopy. You may have a sigmoidoscopy every 5 years or a colonoscopy every 10 years starting at age 90.  Hepatitis C blood test.  Hepatitis B blood test.  Sexually transmitted disease (STD) testing.  Diabetes screening. This is done by checking your blood sugar (glucose) after you have not eaten for a while (fasting). You may have this done every 1-3 years.  Bone density scan. This is done to screen for osteoporosis. You may have this done starting at age 16.  Mammogram. This may be done every 1-2 years. Talk to your health care provider about how often you should have regular mammograms. Talk with your health care provider  about your test results, treatment options, and if necessary, the need for more tests. Vaccines  Your health care provider may recommend certain vaccines, such as:  Influenza vaccine. This is recommended every  year.  Tetanus, diphtheria, and acellular pertussis (Tdap, Td) vaccine. You may need a Td booster every 10 years.  Zoster vaccine. You may need this after age 58.  Pneumococcal 13-valent conjugate (PCV13) vaccine. One dose is recommended after age 75.  Pneumococcal polysaccharide (PPSV23) vaccine. One dose is recommended after age 67. Talk to your health care provider about which screenings and vaccines you need and how often you need them. This information is not intended to replace advice given to you by your health care provider. Make sure you discuss any questions you have with your health care provider. Document Released: 06/14/2015 Document Revised: 02/05/2016 Document Reviewed: 03/19/2015 Elsevier Interactive Patient Education  2017 [employer] Prevention in the Home Falls can cause injuries. They can happen to people of all ages. There are many things you can do to make your home safe and to help prevent falls. What can I do on the outside of my home?  Regularly fix the edges of walkways and driveways and fix any cracks.  Remove anything that might make you trip as you walk through a door, such as a raised step or threshold.  Trim any bushes or trees on the path to your home.  Use bright outdoor lighting.  Clear any walking paths of anything that might make someone trip, such as rocks or tools.  Regularly check to see if handrails are loose or broken. Make sure that both sides of any steps have handrails.  Any raised decks and porches should have guardrails on the edges.  Have any leaves, snow, or ice cleared regularly.  Use sand or salt on walking paths during winter.  Clean up any spills in your garage right away. This includes oil or grease spills. What can I do in the bathroom?  Use night lights.  Install grab bars by the toilet and in the tub and shower. Do not use towel bars as grab bars.  Use non-skid mats or decals in the tub or shower.  If you  need to sit down in the shower, use a plastic, non-slip stool.  Keep the floor dry. Clean up any water that spills on the floor as soon as it happens.  Remove soap buildup in the tub or shower regularly.  Attach bath mats securely with double-sided non-slip rug tape.  Do not have throw rugs and other things on the floor that can make you trip. What can I do in the bedroom?  Use night lights.  Make sure that you have a light by your bed that is easy to reach.  Do not use any sheets or blankets that are too big for your bed. They should not hang down onto the floor.  Have a firm chair that has side arms. You can use this for support while you get dressed.  Do not have throw rugs and other things on the floor that can make you trip. What can I do in the kitchen?  Clean up any spills right away.  Avoid walking on wet floors.  Keep items that you use a lot in easy-to-reach places.  If you need to reach something above you, use a strong step stool that has a grab bar.  Keep electrical cords out of the way.  Do not use floor polish or  wax that makes floors slippery. If you must use wax, use non-skid floor wax.  Do not have throw rugs and other things on the floor that can make you trip. What can I do with my stairs?  Do not leave any items on the stairs.  Make sure that there are handrails on both sides of the stairs and use them. Fix handrails that are broken or loose. Make sure that handrails are as long as the stairways.  Check any carpeting to make sure that it is firmly attached to the stairs. Fix any carpet that is loose or worn.  Avoid having throw rugs at the top or bottom of the stairs. If you do have throw rugs, attach them to the floor with carpet tape.  Make sure that you have a light switch at the top of the stairs and the bottom of the stairs. If you do not have them, ask someone to add them for you. What else can I do to help prevent falls?  Wear shoes  that:  Do not have high heels.  Have rubber bottoms.  Are comfortable and fit you well.  Are closed at the toe. Do not wear sandals.  If you use a stepladder:  Make sure that it is fully opened. Do not climb a closed stepladder.  Make sure that both sides of the stepladder are locked into place.  Ask someone to hold it for you, if possible.  Clearly mark and make sure that you can see:  Any grab bars or handrails.  First and last steps.  Where the edge of each step is.  Use tools that help you move around (mobility aids) if they are needed. These include:  Canes.  Walkers.  Scooters.  Crutches.  Turn on the lights when you go into a dark area. Replace any light bulbs as soon as they burn out.  Set up your furniture so you have a clear path. Avoid moving your furniture around.  If any of your floors are uneven, fix them.  If there are any pets around you, be aware of where they are.  Review your medicines with your doctor. Some medicines can make you feel dizzy. This can increase your chance of falling. Ask your doctor what other things that you can do to help prevent falls. This information is not intended to replace advice given to you by your health care provider. Make sure you discuss any questions you have with your health care provider. Document Released: 03/14/2009 Document Revised: 10/24/2015 Document Reviewed: 06/22/2014 Elsevier Interactive Patient Education  2017 [employer].

## 2020-07-23 ENCOUNTER — Other Ambulatory Visit: Payer: Self-pay

## 2020-07-23 ENCOUNTER — Ambulatory Visit (INDEPENDENT_AMBULATORY_CARE_PROVIDER_SITE_OTHER): Payer: Medicare Other | Admitting: Pulmonary Disease

## 2020-07-23 ENCOUNTER — Encounter: Payer: Self-pay | Admitting: Pulmonary Disease

## 2020-07-23 VITALS — BP 136/70 | HR 75 | Temp 97.5°F | Ht 64.5 in | Wt 155.0 lb

## 2020-07-23 DIAGNOSIS — J8410 Pulmonary fibrosis, unspecified: Secondary | ICD-10-CM

## 2020-07-23 DIAGNOSIS — Z1231 Encounter for screening mammogram for malignant neoplasm of breast: Secondary | ICD-10-CM | POA: Diagnosis not present

## 2020-07-23 LAB — HM MAMMOGRAPHY

## 2020-07-23 NOTE — Progress Notes (Signed)
TYRELL BRERETON    [id number]    [birth year]  Primary Care Physician:Hunter, Brayton Mars, MD  Referring Physician: Marin Olp, MD [address],  [place] [address] [phone] Chief complaint:   Follow up for lung fibrosis.  HPI: [name] is a 75 year old with history of Karlene Lineman cirrhosis, GERD, hiatal hernia. She had an MRI of the abdomen as a part of follow-up for cirrhosis. It showed fibrosis at lung bases. She has been referred here for further follow-up. She has some mild dyspnea on exertion but no other symptoms confined to the chest. She denies any cough, wheezing, sputum production, hemoptysis. She does not have any symptoms suggestive of connective tissue, autoimmune disease. She does have history of unspecified lung fibrosis in her mother and aunt. She does not have any exposures at work to asbestos or other inhalants.  Evaluated by Dr. Kathlene November on November 2020 for elevated antibodies with no evidence of autoimmune disease Received rheumatology clinic note from Dr. Kathlene November, [facility] dated 04/11/2019. Repeat labs including CK, aldolase, CRP negative, sed rate 40.   No concern for active connective tissue disease.  Discussed at multidisciplinary ILD conference in Jan 2021 with diagnosis of likely IPF  Interim History:  States mild increase in dyspnea on exertion overall Had a CT scan last year which showed stable pulmonary fibrosis  Outpatient Encounter Medications as of 07/23/2020  Medication Sig  . aspirin 81 MG chewable tablet Chew 81 mg by mouth daily as needed for mild pain.  . Cyanocobalamin 1000 MCG/ML KIT Inject 1,000 mg as directed. Pt does self injections twice a month.  . esomeprazole (NEXIUM) 40 MG packet Take 40 mg by mouth 3 (three) times a week.  . metFORMIN (GLUCOPHAGE) 500 MG tablet Take 1 tablet (500 mg total) by mouth 2 (two) times daily with a meal.  . Multiple Vitamin (MULTIVITAMIN ADULT PO) Take by mouth. "One a day"  .  oxyCODONE-acetaminophen (PERCOCET/ROXICET) 5-325 MG per tablet Take 0.5 tablets by mouth every 8 (eight) hours as needed for moderate pain or severe pain.   . Probiotic Product (PROBIOTIC PO) Take by mouth.   Facility-Administered Encounter Medications as of 07/23/2020  Medication  . regadenoson (LEXISCAN) injection SOLN 0.4 mg   Physical Exam: Blood pressure 136/70, pulse 75, temperature (!) 97.5 F (36.4 C), temperature source Temporal, height 5' 4.5" (1.638 m), weight 155 lb (70.3 kg), SpO2 95 %. Gen:      No acute distress HEENT:  EOMI, sclera anicteric Neck:     No masses; no thyromegaly Lungs:    Clear to auscultation bilaterally; normal respiratory effort CV:         Regular rate and rhythm; no murmurs Abd:      + bowel sounds; soft, non-tender; no palpable masses, no distension Ext:    No edema; adequate peripheral perfusion Skin:      Warm and dry; no rash Neuro: alert and oriented x 3 Psych: normal mood and affect  Data Reviewed: CT abdomen 12/04/14- Reticulation at bases, unchanged since 2011 CT abdomen 06/02/11- Reticulation at bases. Images reviewed. 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 NSIP fibrosis High res CT 10/01/16- ILD, fibrosis, unchanged.  High res CT 10/20/17-mild progression pulmonary fibrosis, possible honeycombing High-res CT 04/04/2019-mild progression of pulmonary fibrosis in UIP pattern. CT chest 10/26/2019-stable pulmonary fibrosis I have reviewed all images personally.  Serologies 09/05/15 CRP 0.4, sedimentation rate 43 Aldolase 11.2 P-ANCA 1:40 dsDNA- 6  Negative- Jo-1, centromere, RNP, RO, LA RA, CCP, Scl 70  PFTs 09/29/15 FVC 2.45 (80%), FEV1 2.18 (93%), F/F 89, TLC 79%, DLCO 53% Mild restriction, moderately severe diffusion defect.  6-minute walk test 04/11/2019-510 m, exertional O2 sat 93%  Assessment:  Idiopathic pulmonary fibrosis Although there is minimal progression on recent CT compared to 2019 there is  clearly worsening compared to her earliest high-res CT in 2017.  The pattern is UIP suggestive of idiopathic pulmonary fibrosis.  She does have a strong family history of pulmonary fibrosis in her mother and aunt. She does not have any exposure history or symptoms suggestive of connective tissue, autoimmune disease. Serologies are negative except for intermediate elevation in double-stranded DNA and mild elevation in ANCA of unclear significance.   Given progression over time we discussed again next steps for work-up and diagnosis.  She had declined lung biopsy in the past but at this point I do not think we need to proceed with that as the pattern is now is UIP.    Discussed antifibrotic's but patient had been reluctant to try these in the past due to concern for liver damage in the setting of Nash cirrhosis.  I have given her literature on these medications to review and she will consider.  She will also discuss [facility]  Get high-res CT and PFTs in 3 months and follow-up in clinic  Health maintenance Does not want to get vaccinations.  Plan/Recommendations: - Ongoing discussion on antifibrotics - High-res CT, PFTs in 3 months  Marshell Garfinkel MD [facility] 07/23/2020, 10:01 AM  CC: Marin Olp, MD

## 2020-07-23 NOTE — Patient Instructions (Signed)
We have discussed the medications for pulmonary fibrosis which are Ofev or Esbriet.  These may affect the liver which is a concern given your history of Heather Townsend cirrhosis Please discuss at your upcoming appointment with a hepatologist.  We will schedule a high-resolution CT and PFTs in 3 months Follow-up in clinic in 3 months.  At that time we will reassess and plan for next steps.

## 2020-07-24 ENCOUNTER — Encounter: Payer: Self-pay | Admitting: Family Medicine

## 2020-07-25 DIAGNOSIS — R002 Palpitations: Secondary | ICD-10-CM | POA: Diagnosis not present

## 2020-07-31 DIAGNOSIS — H524 Presbyopia: Secondary | ICD-10-CM | POA: Diagnosis not present

## 2020-07-31 DIAGNOSIS — H25813 Combined forms of age-related cataract, bilateral: Secondary | ICD-10-CM | POA: Diagnosis not present

## 2020-07-31 DIAGNOSIS — H35342 Macular cyst, hole, or pseudohole, left eye: Secondary | ICD-10-CM | POA: Diagnosis not present

## 2020-08-07 ENCOUNTER — Other Ambulatory Visit: Payer: Self-pay | Admitting: Nurse Practitioner

## 2020-08-07 DIAGNOSIS — K7469 Other cirrhosis of liver: Secondary | ICD-10-CM

## 2020-08-07 DIAGNOSIS — I8500 Esophageal varices without bleeding: Secondary | ICD-10-CM | POA: Diagnosis not present

## 2020-08-07 DIAGNOSIS — K7689 Other specified diseases of liver: Secondary | ICD-10-CM | POA: Diagnosis not present

## 2020-08-07 DIAGNOSIS — K7581 Nonalcoholic steatohepatitis (NASH): Secondary | ICD-10-CM | POA: Diagnosis not present

## 2020-08-12 ENCOUNTER — Other Ambulatory Visit: Payer: Self-pay

## 2020-08-12 ENCOUNTER — Encounter (HOSPITAL_COMMUNITY): Payer: Self-pay

## 2020-08-12 ENCOUNTER — Emergency Department (HOSPITAL_COMMUNITY): Payer: Medicare Other

## 2020-08-12 ENCOUNTER — Emergency Department (HOSPITAL_COMMUNITY)
Admission: EM | Admit: 2020-08-12 | Discharge: 2020-08-13 | Disposition: A | Discharge status: Home / Self Care | Payer: Medicare Other | Attending: Emergency Medicine | Admitting: Emergency Medicine

## 2020-08-12 DIAGNOSIS — E119 Type 2 diabetes mellitus without complications: Secondary | ICD-10-CM | POA: Diagnosis not present

## 2020-08-12 DIAGNOSIS — Z7982 Long term (current) use of aspirin: Secondary | ICD-10-CM | POA: Diagnosis not present

## 2020-08-12 DIAGNOSIS — I10 Essential (primary) hypertension: Secondary | ICD-10-CM | POA: Insufficient documentation

## 2020-08-12 DIAGNOSIS — N132 Hydronephrosis with renal and ureteral calculous obstruction: Secondary | ICD-10-CM | POA: Diagnosis not present

## 2020-08-12 DIAGNOSIS — R197 Diarrhea, unspecified: Secondary | ICD-10-CM | POA: Diagnosis not present

## 2020-08-12 DIAGNOSIS — K219 Gastro-esophageal reflux disease without esophagitis: Secondary | ICD-10-CM | POA: Diagnosis not present

## 2020-08-12 DIAGNOSIS — K7460 Unspecified cirrhosis of liver: Secondary | ICD-10-CM | POA: Diagnosis not present

## 2020-08-12 DIAGNOSIS — N23 Unspecified renal colic: Secondary | ICD-10-CM | POA: Diagnosis not present

## 2020-08-12 DIAGNOSIS — M545 Low back pain, unspecified: Secondary | ICD-10-CM | POA: Insufficient documentation

## 2020-08-12 DIAGNOSIS — R109 Unspecified abdominal pain: Secondary | ICD-10-CM

## 2020-08-12 DIAGNOSIS — Z87891 Personal history of nicotine dependence: Secondary | ICD-10-CM | POA: Diagnosis not present

## 2020-08-12 DIAGNOSIS — Z7984 Long term (current) use of oral hypoglycemic drugs: Secondary | ICD-10-CM | POA: Insufficient documentation

## 2020-08-12 DIAGNOSIS — Z9851 Tubal ligation status: Secondary | ICD-10-CM | POA: Insufficient documentation

## 2020-08-12 LAB — URINALYSIS, ROUTINE W REFLEX MICROSCOPIC
Bacteria, UA: NONE SEEN
Bilirubin Urine: NEGATIVE
Glucose, UA: >=500 mg/dL — AB
Ketones, ur: 5 mg/dL — AB
Leukocytes,Ua: NEGATIVE
Nitrite: NEGATIVE
Protein, ur: NEGATIVE mg/dL
RBC / HPF: >50 RBC/hpf — ABNORMAL HIGH (ref 0–5)
Specific Gravity, Urine: 1.033 — ABNORMAL HIGH (ref 1.005–1.030)
pH: 5 (ref 5.0–8.0)

## 2020-08-12 LAB — LIPASE, BLOOD: Lipase: 29 U/L (ref 11–51)

## 2020-08-12 LAB — COMPREHENSIVE METABOLIC PANEL
ALT: 67 U/L — ABNORMAL HIGH (ref 0–44)
AST: 70 U/L — ABNORMAL HIGH (ref 15–41)
Albumin: 4 g/dL (ref 3.5–5.0)
Alkaline Phosphatase: 142 U/L — ABNORMAL HIGH (ref 38–126)
Anion gap: 10 (ref 5–15)
BUN: 12 mg/dL (ref 8–23)
CO2: 26 mmol/L (ref 22–32)
Calcium: 9.4 mg/dL (ref 8.9–10.3)
Chloride: 99 mmol/L (ref 98–111)
Creatinine, Ser: 0.66 mg/dL (ref 0.44–1.00)
GFR, Estimated: >60 mL/min (ref >60)
Glucose, Bld: 369 mg/dL — ABNORMAL HIGH (ref 70–99)
Potassium: 4.3 mmol/L (ref 3.5–5.1)
Sodium: 135 mmol/L (ref 135–145)
Total Bilirubin: 0.7 mg/dL (ref 0.3–1.2)
Total Protein: 8.3 g/dL — ABNORMAL HIGH (ref 6.5–8.1)

## 2020-08-12 LAB — CBC
HCT: 44.6 % (ref 36.0–46.0)
Hemoglobin: 14.9 g/dL (ref 12.0–15.0)
MCH: 31.4 pg (ref 26.0–34.0)
MCHC: 33.4 g/dL (ref 30.0–36.0)
MCV: 94.1 fL (ref 80.0–100.0)
Platelets: 94 10*3/uL — ABNORMAL LOW (ref 150–400)
RBC: 4.74 MIL/uL (ref 3.87–5.11)
RDW: 13.2 % (ref 11.5–15.5)
WBC: 5.2 10*3/uL (ref 4.0–10.5)
nRBC: 0 % (ref 0.0–0.2)

## 2020-08-12 MED ORDER — ONDANSETRON HCL 4 MG/2ML IJ SOLN
4.0000 mg | Freq: Once | INTRAMUSCULAR | Status: AC
  Administered 2020-08-13: 4 mg via INTRAVENOUS
  Filled 2020-08-12: qty 2

## 2020-08-12 MED ORDER — MORPHINE SULFATE (PF) 4 MG/ML IV SOLN
4.0000 mg | Freq: Once | INTRAVENOUS | Status: AC
Start: 2020-08-12 — End: 2020-08-13
  Administered 2020-08-13: 4 mg via INTRAVENOUS
  Filled 2020-08-12: qty 1

## 2020-08-12 NOTE — ED Provider Notes (Signed)
[facility] DEPT Provider Note   CSN: [id number] Arrival date & time: 08/12/20  1549     History Chief Complaint  Patient presents with  . Abdominal Pain    Heather Townsend is a 75 y.o. female presents to the Emergency Department complaining of gradual, persistent, progressively worsening left flank and low back pain onset 11am. Associated symptoms include nausea and vomiting in the waiting room.  No fever,chills, hematuria.  Denies falls or trauma to the flank.  No specific aggravating or alleviating factors.  Pt reports hx of kidney stones with similar symptoms in 2016 - none since that time.  Patient reports that 1 renal stone prior to 2016 required lithotripsy.   The history is provided by the patient and medical records. No language interpreter was used.       Past Medical History:  Diagnosis Date  . Allergy   . Cirrhosis ([place])   . Diabetes mellitus without complication ([place])   . Diverticulosis   . DIVERTICULOSIS, COLON 01/03/2007  . Fatty infiltration of liver   . GERD (gastroesophageal reflux disease)   . Hepatitis, chronic active ([place])    NASH  . Hiatal hernia   . HIATAL HERNIA 12/07/2006  . Hyperlipidemia   . Interstitial lung disease ([place])   . Low back pain   . 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    Patient Active Problem List   Diagnosis Date Noted  . Chest pain of uncertain etiology 38/03/1750  . Diabetes mellitus with coincident hypertension ([place]) 06/27/2020  . Hyperlipidemia 06/27/2020  . Palpitations 06/27/2020  . Cirrhosis of liver without ascites ([place]) 06/27/2020  . 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 Uncontrolled type 2 diabetes mellitus with hyperglycemia ([place]) 02/14/2019  . Pulmonary fibrosis ([place]) 02/14/2019  . Trigeminal neuralgia 02/14/2019  . Vitamin B12 deficiency 01/17/2018  . Left thyroid nodule 11/06/2015  . Shortness of breath 08/16/2015  . Cervical disc disease  04/16/2014  . Type 2 diabetes mellitus ([place]) 03/08/2014  . Osteopenia 03/07/2014  . NASH (nonalcoholic steatohepatitis) 04/04/2013  . Rash, skin 01/25/2013  . Chest pain 05/11/2012  . Renal calculus or stone 04/27/2011  . GERD 09/05/2007  . Dyslipidemia 08/16/2007    Past Surgical History:  Procedure Laterality Date  . APPENDECTOMY    . EYE SURGERY     macular hole  . ganglion cysts     left hand  . HAND SURGERY  1996  . SHOULDER SURGERY  2006   rt shoulder  . TONSILLECTOMY    . TUBAL LIGATION    . UTERINE SUSPENSION       OB History   No obstetric history on file.     Family History  Problem Relation Age of Onset  . Heart disease Mother   . Diabetes Mother   . Pancreatic cancer Paternal Uncle   . Colon cancer Paternal Uncle   . Stomach cancer Paternal Uncle   . Cancer Maternal Aunt        Stomach  . Esophageal cancer Maternal Aunt   . Stomach cancer Maternal Aunt   . Esophageal cancer Maternal Uncle   . Stomach cancer Maternal Uncle   . Rectal cancer Neg Hx     Social History   Tobacco Use  . Smoking status: Former Smoker    Packs/day: 0.75    Years: 15.00    Pack years: 11.25    Types: Cigarettes    Quit date: 06/01/1986  Years since quitting: 34.2  . Smokeless tobacco: Never Used  Vaping Use  . Vaping Use: Never used  Substance Use Topics  . Alcohol use: Not Currently    Alcohol/week: 0.0 standard drinks    Comment: rare  . Drug use: No    Home Medications Prior to Admission medications   Medication Sig Start Date End Date Taking? Authorizing Provider  ondansetron (ZOFRAN) 4 MG tablet Take 1 tablet (4 mg total) by mouth every 8 (eight) hours as needed for nausea or vomiting. 08/13/20  Yes Fredna Stricker, Jarrett Soho, PA-C  oxyCODONE (ROXICODONE) 5 MG immediate release tablet Take 1 tablet (5 mg total) by mouth every 6 (six) hours as needed for severe pain. 08/13/20  Yes Marlys Stegmaier, Jarrett Soho, PA-C  tamsulosin (FLOMAX) 0.4 MG CAPS capsule Take 1 capsule  (0.4 mg total) by mouth 2 (two) times daily. 08/13/20  Yes Hardie Veltre, Jarrett Soho, PA-C  aspirin 81 MG chewable tablet Chew 81 mg by mouth daily as needed for mild pain.    [provider]  Cyanocobalamin 1000 MCG/ML KIT Inject 1,000 mg as directed. Pt does self injections twice a month.    [provider]  esomeprazole (NEXIUM) 40 MG packet Take 40 mg by mouth 3 (three) times a week.    [provider]  metFORMIN (GLUCOPHAGE) 500 MG tablet Take 1 tablet (500 mg total) by mouth 2 (two) times daily with a meal. 06/07/20   Marin Olp, MD  Multiple Vitamin (MULTIVITAMIN ADULT PO) Take by mouth. "One a day"    [provider]  Probiotic Product (PROBIOTIC PO) Take by mouth.    [provider]    Allergies    Celecoxib, Codeine, Ibuprofen, Meloxicam, Nsaids, Rofecoxib, Sulfa antibiotics, and Sulfonamide derivatives  Review of Systems   Review of Systems  Constitutional: Negative for appetite change, diaphoresis, fatigue, fever and unexpected weight change.  HENT: Negative for mouth sores.   Eyes: Negative for visual disturbance.  Respiratory: Negative for cough, chest tightness, shortness of breath and wheezing.   Cardiovascular: Negative for chest pain.  Gastrointestinal: Positive for abdominal pain. Negative for constipation, diarrhea, nausea and vomiting.  Endocrine: Negative for polydipsia, polyphagia and polyuria.  Genitourinary: Positive for flank pain. Negative for dysuria, frequency, hematuria and urgency.  Musculoskeletal: Positive for back pain. Negative for neck stiffness.  Skin: Negative for rash.  Allergic/Immunologic: Negative for immunocompromised state.  Neurological: Negative for syncope, light-headedness and headaches.  Hematological: Does not bruise/bleed easily.  Psychiatric/Behavioral: Negative for sleep disturbance. The patient is not nervous/anxious.     Physical Exam Updated Vital Signs BP (!) 165/80   Pulse 80   Temp  98.5 F (36.9 C) (Oral)   Resp 18   Ht 5' 4.5" (1.638 m)   Wt 70.3 kg   SpO2 96%   BMI 26.19 kg/m   Physical Exam Vitals and nursing note reviewed.  Constitutional:      General: She is not in acute distress.    Appearance: She is not diaphoretic.  HENT:     Head: Normocephalic.  Eyes:     General: No scleral icterus.    Conjunctiva/sclera: Conjunctivae normal.  Cardiovascular:     Rate and Rhythm: Normal rate and regular rhythm.     Pulses: Normal pulses.          Radial pulses are 2+ on the right side and 2+ on the left side.  Pulmonary:     Effort: Pulmonary effort is normal. No tachypnea, accessory muscle usage, prolonged expiration,  respiratory distress or retractions.     Breath sounds: Normal breath sounds. No stridor.     Comments: Equal chest rise. No increased work of breathing. Abdominal:     General: There is no distension.     Palpations: Abdomen is soft.     Tenderness: There is no abdominal tenderness. There is left CVA tenderness. There is no right CVA tenderness, guarding or rebound.  Musculoskeletal:     Cervical back: Normal range of motion.     Comments: Moves all extremities equally and without difficulty.  Skin:    General: Skin is warm and dry.     Capillary Refill: Capillary refill takes less than 2 seconds.  Neurological:     Mental Status: She is alert.     GCS: GCS eye subscore is 4. GCS verbal subscore is 5. GCS motor subscore is 6.     Comments: Speech is clear and goal oriented.  Psychiatric:        Mood and Affect: Mood normal.     ED Results / Procedures / Treatments   Labs (all labs ordered are listed, but only abnormal results are displayed) Labs Reviewed  COMPREHENSIVE METABOLIC PANEL - Abnormal; Notable for the following components:      Result Value   Glucose, Bld 369 (*)    Total Protein 8.3 (*)    AST 70 (*)    ALT 67 (*)    Alkaline Phosphatase 142 (*)    All other components within normal limits  CBC - Abnormal;  Notable for the following components:   Platelets 94 (*)    All other components within normal limits  URINALYSIS, ROUTINE W REFLEX MICROSCOPIC - Abnormal; Notable for the following components:   APPearance HAZY (*)    Specific Gravity, Urine 1.033 (*)    Glucose, UA >=500 (*)    Hgb urine dipstick LARGE (*)    Ketones, ur 5 (*)    RBC / HPF >50 (*)    All other components within normal limits  LIPASE, BLOOD    Radiology CT Renal Stone Study  Result Date: 08/13/2020 CLINICAL DATA:  Left-sided abdominal pain with diarrhea hematuria EXAM: CT ABDOMEN AND PELVIS WITHOUT CONTRAST TECHNIQUE: Multidetector CT imaging of the abdomen and pelvis was performed following the standard protocol without IV contrast. COMPARISON:  CT 10/26/2019, MRI 07/04/2019 FINDINGS: Lower chest: Lung bases demonstrate pulmonary fibrosis. No acute consolidation or effusion. Hepatobiliary: Lobulated liver contour consistent with cirrhosis. Small calcified gallstone. No biliary dilatation. Pancreas: Unremarkable. No pancreatic ductal dilatation or surrounding inflammatory changes. Spleen: Borderline to slightly enlarged. Adrenals/Urinary Tract: Adrenal glands are normal. Punctate stones in the right kidney. Small stones in the left kidney measuring up to 4 mm in size at the lower pole. Minimal left hydronephrosis and proximal hydroureter, secondary to a 3 mm stone within the mid left ureter. Urinary bladder is unremarkable. Stomach/Bowel: Stomach is within normal limits. No evidence of bowel wall thickening, distention, or inflammatory changes. Vascular/Lymphatic: Moderate aortic atherosclerosis. No aneurysm. Stable small periportal and retroperitoneal lymph nodes. Recanalized paraumbilical vein consistent with portal hypertension. Reproductive: Uterus and bilateral adnexa are unremarkable. Other: Negative for free air or free fluid. Musculoskeletal: Chronic superior endplate deformity at L2. IMPRESSION: 1. Minimal left  hydronephrosis and proximal hydroureter, secondary to a 3 mm stone within the mid left ureter. 2. Bilateral intrarenal calculi. 3. 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 4. Pulmonary fibrosis. Aortic Atherosclerosis (ICD10-I70.0). Electronically Signed   By: Donavan Foil  M.D.   On: 08/13/2020 00:05    Procedures Procedures   Medications Ordered in ED Medications  morphine 4 MG/ML injection 4 mg (4 mg Intravenous Given 08/13/20 0020)  ondansetron (ZOFRAN) injection 4 mg (4 mg Intravenous Given 08/13/20 0020)    ED Course  I have reviewed the triage vital signs and the nursing notes.  Pertinent labs & imaging results that were available during my care of the patient were reviewed by me and considered in my medical decision making (see chart for details).  Clinical Course as of 08/13/20 0039  Mon Aug 12, 2020  2341 AST(!): 70 History of cirrhosis and fatty liver.  AST/ALT appears to be baseline. [HM]  9381 BP(!): 165/80 Hypertensive however patient rates pain at 5/10. [HM]  2342 Hgb urine dipstick(!): LARGE Large-volume hemoglobin but no evidence of urinary tract infection.  Doubt infected stone. [HM]    Clinical Course User Index [HM] Verlena Marlette, Gwenlyn Perking   MDM Rules/Calculators/A&P                            Patient presents with left-sided flank pain and previous history of nephrolithiasis.   Considering renal colic, infected ureteral stone, pyelonephritis.  Additional history obtained:  Previous records obtained and reviewed.    Lab Tests:  I Ordered, reviewed, and interpreted labs, which included:  CBC without leukocytosis or anemia, CMP with elevated AST and ALT to baseline, lipase within normal limits and urinalysis with large amount of hemoglobin but no nitrites or leukocytes or bacteria to suggest UTI.   Imaging Studies ordered:  I have placed order for imaging including CT renal. I personally reviewed and interpreted imaging.  Mild  hydronephrosis of the left due to a 3 mm ureteral stone.   ED Course:  Patient with flank pain rated at 5/10 on my evaluation.  Mild left-sided CVA tenderness.  Labs reassuring.  CT scan pending.  Pain control and nausea medication given.  12:33 AM CT renal with mild left-sided hydronephrosis secondary to 3 mm stone.  Patient reports her pain is significantly better.  She is eating, drinking and walking here in the emergency department without any difficulty.  Remains A&O x4.  Will discharge home with prescription for pain control, Flomax, nausea medication.  Precautions for narcotic pain medication discussed including increased potential for lightheadedness, dizziness.  Patient encouraged not to drive while taking this medication.  She will have close follow-up with primary care and urology.  Patient states understanding and is in agreement with the plan.  Patient voiced understanding and agreement wit the current medical evaluation and treatment plan.  Questions were answered to expressed satisfaction.    Strict return precautions given and patient is stable at time of discharge.    Portions of this note were generated with Lobbyist. Dictation errors may occur despite best attempts at proofreading.   Final Clinical Impression(s) / ED Diagnoses Final diagnoses:  Acute left flank pain  Ureteral colic    Rx / DC Orders ED Discharge Orders         Ordered    tamsulosin (FLOMAX) 0.4 MG CAPS capsule  2 times daily        08/13/20 0038    ondansetron (ZOFRAN) 4 MG tablet  Every 8 hours PRN        08/13/20 0038    oxyCODONE (ROXICODONE) 5 MG immediate release tablet  Every 6 hours PRN  08/13/20 0038           Jermie Hippe, Jarrett Soho, PA-C 08/13/20 9290    Orpah Greek, MD 08/13/20 [phone]

## 2020-08-12 NOTE — ED Notes (Signed)
Urine culture sent to lab.

## 2020-08-12 NOTE — ED Triage Notes (Signed)
Patient c/o left upper and lower abdominal pain. Patient also reports that she had left lower back pain last week. Patient also c/o nausea and diarrhea.

## 2020-08-13 DIAGNOSIS — N132 Hydronephrosis with renal and ureteral calculous obstruction: Secondary | ICD-10-CM | POA: Diagnosis not present

## 2020-08-13 MED ORDER — OXYCODONE HCL 5 MG PO TABS: 5.0000 mg | ORAL_TABLET | Freq: Four times a day (QID) | ORAL | 0 refills | Status: DC | PRN

## 2020-08-13 MED ORDER — ONDANSETRON HCL 4 MG PO TABS: 4.0000 mg | ORAL_TABLET | Freq: Three times a day (TID) | ORAL | 0 refills | Status: DC | PRN

## 2020-08-13 MED ORDER — TAMSULOSIN HCL 0.4 MG PO CAPS: 0.4000 mg | ORAL_CAPSULE | Freq: Two times a day (BID) | ORAL | 0 refills | Status: DC

## 2020-08-13 NOTE — Discharge Instructions (Addendum)
1. Medications: Oxycodone Flomax, Zofran, usual home medications 2. Treatment: rest, drink plenty of fluids. Do not drive or operate heavy machinery while taking narcotic pain medication.  Please be aware that it can make you sleepy and off-balance increasing falls. 3. Follow Up: Please followup with your primary doctor and/or urology in 2-3 days for discussion of your diagnoses and further evaluation after today's visit; Please return to the ER for worsening pain, high fevers or persistent vomiting.

## 2020-08-13 NOTE — ED Notes (Addendum)
Patient provided water and crackers.

## 2020-08-19 DIAGNOSIS — N200 Calculus of kidney: Secondary | ICD-10-CM | POA: Diagnosis not present

## 2020-08-23 ENCOUNTER — Telehealth: Payer: Self-pay

## 2020-08-23 NOTE — Telephone Encounter (Signed)
Called patient to schedule her Upper Endoscopy for cirrhosis with varices. [name] Plan reports he would like patient to be scheduled at [facility] hospital in case varices needs banding. Offered patient 09/30/20 which is the only opening we have at this time. Patient states she cannot have a procedure on Monday because she does work on Mondays. Informed patient that this is the only day I have right now available and future dates will also be on a Monday. Patient states she has to be scheduled on a Tuesday or Wednesday. Asked patient if she can take a day off work and she reports she cannot. Patient states she never had it scheduled at the hospital in the past and is concerned that she does now. Informed patient that she needs to be scheduled at the hospital due to her varices that could possibly need to be banded. Patient verbalized understanding. Please advise [name] Plan if another option is available. Possibly during your hospital week?

## 2020-08-23 NOTE — Telephone Encounter (Signed)
Please wait for the June or July schedule and hopefully I will have a Tuesday available.

## 2020-08-26 DIAGNOSIS — K766 Portal hypertension: Secondary | ICD-10-CM | POA: Diagnosis not present

## 2020-08-26 DIAGNOSIS — N200 Calculus of kidney: Secondary | ICD-10-CM | POA: Diagnosis not present

## 2020-08-26 DIAGNOSIS — K8020 Calculus of gallbladder without cholecystitis without obstruction: Secondary | ICD-10-CM | POA: Diagnosis not present

## 2020-08-26 DIAGNOSIS — K7460 Unspecified cirrhosis of liver: Secondary | ICD-10-CM | POA: Diagnosis not present

## 2020-08-26 NOTE — Telephone Encounter (Signed)
Left message for patient to return my call.

## 2020-08-26 NOTE — Telephone Encounter (Signed)
Pt is returning a missed  A call

## 2020-08-27 ENCOUNTER — Other Ambulatory Visit: Payer: Self-pay

## 2020-08-27 DIAGNOSIS — K7460 Unspecified cirrhosis of liver: Secondary | ICD-10-CM

## 2020-08-27 DIAGNOSIS — I8500 Esophageal varices without bleeding: Secondary | ICD-10-CM

## 2020-08-27 NOTE — Telephone Encounter (Signed)
Scheduled patient for EGD at [facility] on 11/18/20 at 7:30am and covid screen on 11/14/20 at 10am.

## 2020-08-27 NOTE — Telephone Encounter (Signed)
Patient states she can now have her procedure on a Monday. She states she will just have to change her work schedule. Informed patient that the 09/30/20 date is full now and we will be booking into June or July. Informed patient I will find a date in June and contact her once it is scheduled to review instructions. Patient verbalized understanding.

## 2020-08-29 NOTE — Telephone Encounter (Signed)
Patient informed of dates and times of procedure and covid screening test. Informed patient I will mail her instructions to her home address and once she receives the instructions to please call our office with any questions or concerns. Patient verbalized understanding.

## 2020-09-09 ENCOUNTER — Telehealth: Payer: Self-pay | Admitting: Pulmonary Disease

## 2020-09-09 NOTE — Telephone Encounter (Signed)
Spoke with patient. She stated that she has had 2 CT scans within the last 30 days. She had a CT renal study done at [facility] in March as well as a CT abd/pelvis in March as well. She does not want to proceed with the high res CT that is scheduled for May 2022. I advised her that I would send a message to [name] to make him aware.   [name], please advise. Thanks.

## 2020-09-11 NOTE — Progress Notes (Deleted)
Phone [phone] In person visit   Subjective:   Heather Townsend is a 75 y.o. year old very pleasant female patient who presents for/with See problem oriented charting No chief complaint on file.   This visit occurred during the SARS-CoV-2 public health emergency.  Safety protocols were in place, including screening questions prior to the visit, additional usage of staff PPE, and extensive cleaning of exam room while observing appropriate contact time as indicated for disinfecting solutions.   Past Medical History-  Patient Active Problem List   Diagnosis Date Noted  . Chest pain of uncertain etiology 03/70/4888  . Diabetes mellitus with coincident hypertension ([place]) 06/27/2020  . Hyperlipidemia 06/27/2020  . Palpitations 06/27/2020  . Cirrhosis of liver without ascites ([place]) 06/27/2020  . Esophageal varices without bleeding ([place]) 02/14/2019  . Uncontrolled type 2 diabetes mellitus with hyperglycemia ([place]) 02/14/2019  . Pulmonary fibrosis ([place]) 02/14/2019  . Trigeminal neuralgia 02/14/2019  . Vitamin B12 deficiency 01/17/2018  . Left thyroid nodule 11/06/2015  . Shortness of breath 08/16/2015  . Cervical disc disease 04/16/2014  . Type 2 diabetes mellitus ([place]) 03/08/2014  . Osteopenia 03/07/2014  . NASH (nonalcoholic steatohepatitis) 04/04/2013  . Rash, skin 01/25/2013  . Chest pain 05/11/2012  . Renal calculus or stone 04/27/2011  . GERD 09/05/2007  . Dyslipidemia 08/16/2007    Medications- reviewed and updated Current Outpatient Medications  Medication Sig Dispense Refill  . aspirin 81 MG chewable tablet Chew 81 mg by mouth daily as needed for mild pain.    . Cyanocobalamin 1000 MCG/ML KIT Inject 1,000 mg as directed. Pt does self injections twice a month.    . esomeprazole (NEXIUM) 40 MG packet Take 40 mg by mouth 3 (three) times a week.    . metFORMIN (GLUCOPHAGE) 500 MG tablet Take 1 tablet (500 mg total) by mouth 2 (two) times daily with a meal. 180 tablet 3  .  Multiple Vitamin (MULTIVITAMIN ADULT PO) Take by mouth. "One a day"    . ondansetron (ZOFRAN) 4 MG tablet Take 1 tablet (4 mg total) by mouth every 8 (eight) hours as needed for nausea or vomiting. 10 tablet 0  . oxyCODONE (ROXICODONE) 5 MG immediate release tablet Take 1 tablet (5 mg total) by mouth every 6 (six) hours as needed for severe pain. 7 tablet 0  . Probiotic Product (PROBIOTIC PO) Take by mouth.    . tamsulosin (FLOMAX) 0.4 MG CAPS capsule Take 1 capsule (0.4 mg total) by mouth 2 (two) times daily. 10 capsule 0   No current facility-administered medications for this visit.   Facility-Administered Medications Ordered in Other Visits  Medication Dose Route Frequency Provider Last Rate Last Admin  . regadenoson (LEXISCAN) injection SOLN 0.4 mg  0.4 mg Intravenous Once Dorothy Spark, MD         Objective:  There were no vitals taken for this visit. Gen: NAD, resting comfortably CV: RRR no murmurs rubs or gallops Lungs: CTAB no crackles, wheeze, rhonchi Abdomen: soft/nontender/nondistended/normal bowel sounds. No rebound or guarding.  Ext: no edema Skin: warm, dry Neuro: grossly normal, moves all extremities  ***    Assessment and Plan   # Diabetes S: Medication:Metformin 500Mg CBGs- *** Exercise and diet- *** Lab Results  Component Value Date   HGBA1C 10.6 (H) 06/07/2020   HGBA1C 11.8 (H) 02/14/2019   HGBA1C 7.9 (H) 01/17/2018    A/P: ***   ***didnt do dexa from 02/14/2019 or mammogram  No problem-specific Assessment & Plan notes found for  this encounter.   Recommended follow up: ***No follow-ups on file. Future Appointments  Date Time Provider [place]  09/16/2020  1:20 PM Marin Olp, MD [facility]  10/04/2020  9:00 AM Werner Lean, MD [facility]  10/08/2020 10:00 AM GI-315 CT 1 GI-315CT [facility]  11/14/2020 10:00 AM [facility] None  07/21/2021 11:45 AM [facility] HEALTH COACH [facility]     Lab/Order associations:   ICD-10-CM   1. Diabetes mellitus with coincident hypertension (HCC)  E11.9    I10     No orders of the defined types were placed in this encounter.   Time Spent: *** minutes of total time (11:28 AM***- 11:28 AM***) was spent on the date of the encounter performing the following actions: chart review prior to seeing the patient, obtaining history, performing a medically necessary exam, counseling on the treatment plan, placing orders, and documenting in our EHR.   Return precautions advised.  Clyde Lundborg, CMA

## 2020-09-11 NOTE — Patient Instructions (Incomplete)
Please stop by lab before you go If you have mychart- we will send your results within 3 business days of [place] receiving them.  If you do not have mychart- we will call you about results within 5 business days of [place] receiving them.  *please also note that you will see labs on mychart as soon as they post. I will later go in and write notes on them- will say "notes from Dr. Yong Channel"  Health Maintenance Due  Topic Date Due  . TETANUS/TDAP  07/30/2016  . OPHTHALMOLOGY EXAM  06/14/2019   Depression screen [facility] 2/9 07/15/2020 06/07/2020 04/11/2019  Decreased Interest 0 0 0  Down, Depressed, Hopeless 0 0 0  PHQ - 2 Score 0 0 0  Altered sleeping - 1 -  Tired, decreased energy - 0 -  Change in appetite - 0 -  Feeling bad or failure about yourself  - 0 -  Trouble concentrating - 0 -  Moving slowly or fidgety/restless - 0 -  Suicidal thoughts - 0 -  PHQ-9 Score - 1 -  Difficult doing work/chores - Not difficult at all -

## 2020-09-16 ENCOUNTER — Ambulatory Visit: Payer: Medicare Other | Admitting: Family Medicine

## 2020-09-16 DIAGNOSIS — E119 Type 2 diabetes mellitus without complications: Secondary | ICD-10-CM

## 2020-09-16 DIAGNOSIS — Z0289 Encounter for other administrative examinations: Secondary | ICD-10-CM

## 2020-09-16 NOTE — Telephone Encounter (Signed)
I have called and LM on VM for the pt to call back.  She will just need to be scheduled in 3 months for PFT and APPT with PM.  Thanks

## 2020-09-16 NOTE — Telephone Encounter (Signed)
Ok to cancel the HRCT order.  Can you order PFTs in the next 3 months and follow up in clinic

## 2020-09-17 NOTE — Telephone Encounter (Signed)
ATC pt on home number but she does not accept blocked numbers.  lmtcb for pt on mobile number.   Pt needs PFT, COVID test, and OV with [name] in 3 months.

## 2020-09-19 ENCOUNTER — Encounter: Payer: Self-pay | Admitting: Emergency Medicine

## 2020-09-19 NOTE — Telephone Encounter (Signed)
Letter printed and mailed to the pt  °

## 2020-09-19 NOTE — Telephone Encounter (Signed)
Due to several attempts to reach pt a letter will be sent to pts home to advise her to call us back to schedule these appts.   Will forward to triage to print and mail letter to pt's home, can close encounter once complete.

## 2020-09-27 ENCOUNTER — Encounter: Payer: Self-pay | Admitting: Family Medicine

## 2020-10-01 NOTE — Progress Notes (Deleted)
Cardiology Office Note:    Date:  10/01/2020   ID:  Heather Townsend, DOB 1946/03/05, MRN [id number]  PCP:  Heather Olp, MD  [facility] Cardiologist:  Heather Lean, MD  [place] Electrophysiologist:  None   CC: Chest pain follow up  History of Present Illness:    Heather Townsend is a 75 y.o. female with a hx of DM with HLD. Aortic atherosclerosis, NASH with Cirrhosis ([facility]), pulmonary fibrosis, MVP who presented for evaluation 06/27/20. In interim of this visit, patient had negative stress test; seen 5/22.  Patient notes that she is doing ***.  Since day prior/last visit notes *** changes.  Relevant interval testing or therapy include ***.  Had interval admission for kidney stone.  No chest pain or pressure ***.  No SOB/DOE*** and no PND/Orthopnea***.  No weight gain or leg swelling***.  No palpitations or syncope ***.  Ambulatory blood pressure ***.    Past Medical History:  Diagnosis Date  . Allergy   . Cirrhosis ([place])   . Diabetes mellitus without complication ([place])   . Diverticulosis   . DIVERTICULOSIS, COLON 01/03/2007  . Fatty infiltration of liver   . GERD (gastroesophageal reflux disease)   . Hepatitis, chronic active ([place])    NASH  . Hiatal hernia   . HIATAL HERNIA 12/07/2006  . Hyperlipidemia   . Interstitial lung disease ([place])   . Low back pain   . 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    Past Surgical History:  Procedure Laterality Date  . APPENDECTOMY    . EYE SURGERY     macular hole  . ganglion cysts     left hand  . HAND SURGERY  1996  . SHOULDER SURGERY  2006   rt shoulder  . TONSILLECTOMY    . TUBAL LIGATION    . UTERINE SUSPENSION      Current Medications: No outpatient medications have been marked as taking for the 10/04/20 encounter (Appointment) with Heather Lean, MD.     Allergies:   Celecoxib, Codeine, Ibuprofen, Meloxicam, Nsaids, Rofecoxib, Sulfa antibiotics, and Sulfonamide  derivatives   Social History   Socioeconomic History  . Marital status: Divorced    Spouse name: Not on file  . Number of children: Not on file  . Years of education: Not on file  . Highest education level: Not on file  Occupational History  . Occupation: Glass blower/designer  . Occupation: care giver  Tobacco Use  . Smoking status: Former Smoker    Packs/day: 0.75    Years: 15.00    Pack years: 11.25    Types: Cigarettes    Quit date: 06/01/1986    Years since quitting: 34.3  . Smokeless tobacco: Never Used  Vaping Use  . Vaping Use: Never used  Substance and Sexual Activity  . Alcohol use: Not Currently    Alcohol/week: 0.0 standard drinks    Comment: rare  . Drug use: No  . Sexual activity: Not Currently  Other Topics Concern  . Not on file  Social History Narrative   Divorced. Long term boyfriend passed 2018.  Lives alone. 2 children (son in [place], daughter in [place]). 1 granddaughter in [place].       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    Caregiving for 3 people as of 2020   Social Determinants of Health   Financial Resource Strain: Low Risk   .  Difficulty of Paying Living Expenses: Not hard at all  Food Insecurity: No Food Insecurity  . Worried About Charity fundraiser in the Last Year: Never true  . Ran Out of Food in the Last Year: Never true  Transportation Needs: No Transportation Needs  . Lack of Transportation (Medical): No  . Lack of Transportation (Non-Medical): No  Physical Activity: Insufficiently Active  . Days of Exercise per Week: 4 days  . Minutes of Exercise per Session: 20 min  Stress: No Stress Concern Present  . Feeling of Stress : Only a little  Social Connections: Moderately Integrated  . Frequency of Communication with Friends and Family: More than three times a week  . Frequency of Social Gatherings with Friends and Family: More than three times a week  . Attends Religious  Services: 1 to 4 times per year  . Active Member of Clubs or Organizations: Yes  . Attends Archivist Meetings: 1 to 4 times per year  . Marital Status: Divorced     Family History: The patient's family history includes Cancer in her maternal aunt; Colon cancer in her paternal uncle; Diabetes in her mother; Esophageal cancer in her maternal aunt and maternal uncle; Heart disease in her mother; Pancreatic cancer in her paternal uncle; Stomach cancer in her maternal aunt, maternal uncle, and paternal uncle. There is no history of Rectal cancer.  History of coronary artery disease notable for no members. History of heart failure notable for mother. History of arrhythmia notable for atrial fibrillation in mother and sisters.  ROS:   Please see the history of present illness.     All other systems reviewed and are negative.  EKGs/Labs/Other Studies Reviewed:    The following studies were reviewed today:  EKG:   06/27/20: SR rate 77 with nonspecific TWI  Transthoracic Echocardiogram: Date: 08/12/2007 Results: normal biventricular function SUMMARY  - Overall left ventricular systolic function was normal. Left     ventricular ejection fraction was estimated to be 65 %. There     were no left ventricular regional wall motion abnormalities.  - The left atrium was mildly dilated.    NonCardiac CT: Date: 10/26/19 Results:Aortic root atherosclerosis without coronary artery calcification and with notable hepatomegaly  NM Stress Testing : Date: 05/01/2005 Results: static perfusion imaging normal  Date: 07/11/20 Results:   The left ventricular ejection fraction is hyperdynamic (>65%).  Nuclear stress EF: 74%.  There was no ST segment deviation noted during stress.  Blood pressure demonstrated a normal response to exercise.  The study is normal.  This is a low risk study.   Normal exercise nuclear stress test with no evidence for prior infarct or ischemia.  Normal LVEF. Normal exercise tolerance. Normal BP response to exertion.    Stress Echo: Date:05/31/2012 Results: No resting or exercise WMAs   Recent Labs: 08/12/2020: ALT 67; BUN 12; Creatinine, Ser 0.66; Hemoglobin 14.9; Platelets 94; Potassium 4.3; Sodium 135  Recent Lipid Panel    Component Value Date/Time   CHOL 179 06/07/2020 1045   TRIG 96.0 06/07/2020 1045   HDL 47.70 06/07/2020 1045   CHOLHDL 4 06/07/2020 1045   VLDL 19.2 06/07/2020 1045   LDLCALC 112 (H) 06/07/2020 1045   LDLDIRECT 148.3 11/28/2012 0905     Risk Assessment/Calculations:     N/A  Physical Exam:    VS:  There were no vitals taken for this visit.    Wt Readings from Last 3 Encounters:  08/12/20 155 lb (70.3 kg)  07/23/20 155 lb (70.3 kg)  07/11/20 153 lb (69.4 kg)    GEN:  Well nourished, well developed in no acute distress HEENT: Normal NECK: No JVD; No carotid bruits LYMPHATICS: No lymphadenopathy CARDIAC: RRR, no murmurs, rubs, gallops RESPIRATORY:  Good air movement course breath sounds bilaterally ABDOMEN: Soft, non-tender, non-distended MUSCULOSKELETAL:  No edema; No deformity no blue toes left foot SKIN: Warm and dry NEUROLOGIC:  Alert and oriented x 3 PSYCHIATRIC:  Normal affect   ASSESSMENT:    No diagnosis found. PLAN:     Palpitations; possible SVT - will obtain 14-day non live heart monitor (ZioPatch) - AV Nodal Therapy: will decide on BB based on results  Diabetes with Hypertension - ambulatory blood pressure 132/74, will continue ambulatory BP monitoring; gave education on how to perform ambulatory blood pressure monitoring including the frequency and technique; goal ambulatory blood pressure < 135/85 on average - after results will decided type of BB based on ambulatory BP - discussed diet (DASH/low sodium), and exercise/weight loss interventions   Hyperlipidemia (mixed) Aortic Atherosclerosis Cirrhosis -LDL goal less than 70 - had declined medical therapy in the  past and with Primary MD - gave education on dietary changes    Medication Adjustments/Labs and Tests Ordered: Current medicines are reviewed at length with the patient today.  Concerns regarding medicines are outlined above.  No orders of the defined types were placed in this encounter.  No orders of the defined types were placed in this encounter.   There are no Patient Instructions on file for this visit.   Signed, Heather Lean, MD  10/01/2020 2:03 PM    [place]

## 2020-10-04 ENCOUNTER — Ambulatory Visit: Payer: Medicare Other | Admitting: Internal Medicine

## 2020-10-07 ENCOUNTER — Telehealth: Payer: Self-pay | Admitting: Pulmonary Disease

## 2020-10-07 NOTE — Telephone Encounter (Signed)
I have called and LM on VM for pt to call back.  Looks like she had a CT renal and was scheduled for CT HR on 05/10.

## 2020-10-08 ENCOUNTER — Other Ambulatory Visit: Payer: Medicare Other

## 2020-10-11 NOTE — Telephone Encounter (Signed)
Previous note stated [name] was ok with her not doing the HRCT. Will close encounter.

## 2020-10-21 ENCOUNTER — Telehealth: Payer: Self-pay | Admitting: Gastroenterology

## 2020-10-21 NOTE — Telephone Encounter (Signed)
Called patient with no answer and no voicemail to leave a message.

## 2020-10-21 NOTE — Telephone Encounter (Signed)
Inbound call from patient. Need to cancel procedure at [facility] 11/18/20. States she will be out of town. States she would like to wait till fall to reschedule.

## 2020-10-22 NOTE — Telephone Encounter (Signed)
Called patient with no answer and voicemail box is full, could not leave a message. Will cancel procedure at this time and wait phone call from patient to reschedule.  FY [name] Plan

## 2020-11-14 ENCOUNTER — Other Ambulatory Visit (HOSPITAL_COMMUNITY): Payer: Medicare Other

## 2020-11-18 ENCOUNTER — Ambulatory Visit (HOSPITAL_COMMUNITY): Admit: 2020-11-18 | Payer: Medicare Other | Admitting: Gastroenterology

## 2020-11-18 ENCOUNTER — Encounter (HOSPITAL_COMMUNITY): Payer: Self-pay

## 2020-11-18 SURGERY — ESOPHAGOGASTRODUODENOSCOPY (EGD) WITH PROPOFOL
Anesthesia: Monitor Anesthesia Care

## 2021-01-21 DIAGNOSIS — Z20822 Contact with and (suspected) exposure to covid-19: Secondary | ICD-10-CM | POA: Diagnosis not present

## 2021-02-02 DIAGNOSIS — Z20822 Contact with and (suspected) exposure to covid-19: Secondary | ICD-10-CM | POA: Diagnosis not present

## 2021-05-13 ENCOUNTER — Telehealth: Payer: Self-pay | Admitting: Family Medicine

## 2021-05-13 NOTE — Progress Notes (Signed)
°  Care Management   Follow Up Note   05/13/2021 Name: Heather Townsend MRN: [id number] DOB: January 19, 1946   Referred by: Marin Olp, MD Reason for referral : No chief complaint on file.   An unsuccessful telephone outreach was attempted today. The patient was referred to the case management team for assistance with care management and care coordination.   Follow Up Plan: The care management team will reach out to the patient again over the next 7 days.   [place]

## 2021-07-21 ENCOUNTER — Ambulatory Visit (INDEPENDENT_AMBULATORY_CARE_PROVIDER_SITE_OTHER): Payer: Medicare Other

## 2021-07-21 ENCOUNTER — Other Ambulatory Visit: Payer: Self-pay

## 2021-07-21 DIAGNOSIS — Z0000 Encounter for general adult medical examination without abnormal findings: Secondary | ICD-10-CM

## 2021-07-21 NOTE — Progress Notes (Addendum)
Virtual Visit via Telephone Note  I connected with  Heather Townsend on 07/21/21 at 11:45 AM EST by telephone and verified that I am speaking with the correct person using two identifiers.  Medicare Annual Wellness visit completed telephonically due to Covid-19 pandemic.   Persons participating in this call: This Health Coach and this patient.   Location: Patient: Home Provider: Office    I discussed the limitations, risks, security and privacy concerns of performing an evaluation and management service by telephone and the availability of in person appointments. The patient expressed understanding and agreed to proceed.  Unable to perform video visit due to video visit attempted and failed and/or patient does not have video capability.   Some vital signs may be absent or patient reported.   Willette Brace, LPN   Subjective:   Heather Townsend is a 76 y.o. female who presents for Medicare Annual (Subsequent) preventive examination.  Review of Systems     Cardiac Risk Factors include: advanced age (>68mn, >>19women)     Objective:    There were no vitals filed for this visit. There is no height or weight on file to calculate BMI.  Advanced Directives 07/21/2021 08/12/2020 07/15/2020 01/13/2019 08/13/2018 08/22/2016 06/23/2016  Does Patient Have a Medical Advance Directive? _0  No No  Does patient want to make changes to medical advance directive? No - Patient declined - - - - - -  Would patient like information on creating a medical advance directive? - No - Patient declined No - Patient declined - - - -  Pre-existing out of facility DNR order (yellow form or pink MOST form) - - - - - - -    Current Medications (verified) Outpatient Encounter Medications as of 07/21/2021  Medication Sig   aspirin 81 MG chewable tablet Chew 81 mg by mouth daily as needed for mild pain.   Cyanocobalamin 1000 MCG/ML KIT Inject 1,000 mg as directed. Pt does self injections twice a month.    esomeprazole (NEXIUM) 40 MG packet Take 40 mg by mouth 3 (three) times a week.   metFORMIN (GLUCOPHAGE) 500 MG tablet Take 1 tablet (500 mg total) by mouth 2 (two) times daily with a meal.   Multiple Vitamin (MULTIVITAMIN ADULT PO) Take by mouth. "One a day"   oxyCODONE (ROXICODONE) 5 MG immediate release tablet Take 1 tablet (5 mg total) by mouth every 6 (six) hours as needed for severe pain.   Probiotic Product (PROBIOTIC PO) Take by mouth.   [DISCONTINUED] ondansetron (ZOFRAN) 4 MG tablet Take 1 tablet (4 mg total) by mouth every 8 (eight) hours as needed for nausea or vomiting.   [DISCONTINUED] tamsulosin (FLOMAX) 0.4 MG CAPS capsule Take 1 capsule (0.4 mg total) by mouth 2 (two) times daily.   Facility-Administered Encounter Medications as of 07/21/2021  Medication   regadenoson (LEXISCAN) injection SOLN 0.4 mg    Allergies (verified) Celecoxib, Codeine, Ibuprofen, Meloxicam, Nsaids, Rofecoxib, Sulfa antibiotics, and Sulfonamide derivatives   History: Past Medical History:  Diagnosis Date   Allergy    Cirrhosis ([facility]    Diabetes mellitus without complication ([facility]    Diverticulosis    DIVERTICULOSIS, COLON 01/03/2007   Fatty infiltration of liver    GERD (gastroesophageal reflux disease)    Hepatitis, chronic active ([facility]    NASH   Hiatal hernia    HIATAL HERNIA 12/07/2006   Hyperlipidemia    Interstitial lung disease ([facility])    Low back pain  Tuberculosis    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   Past Surgical History:  Procedure Laterality Date   APPENDECTOMY     EYE SURGERY     macular hole   ganglion cysts     left hand   HAND SURGERY  1996   SHOULDER SURGERY  2006   rt shoulder   TONSILLECTOMY     TUBAL LIGATION     UTERINE SUSPENSION     Family History  Problem Relation Age of Onset   Heart disease Mother    Diabetes Mother    Pancreatic cancer Paternal Uncle    Colon cancer Paternal Uncle    Stomach cancer Paternal Uncle    Cancer Maternal Aunt         Stomach   Esophageal cancer Maternal Aunt    Stomach cancer Maternal Aunt    Esophageal cancer Maternal Uncle    Stomach cancer Maternal Uncle    Rectal cancer Neg Hx    Social History   Socioeconomic History   Marital status: Divorced    Spouse name: Not on file   Number of children: Not on file   Years of education: Not on file   Highest education level: Not on file  Occupational History   Occupation: Glass blower/designer   Occupation: care giver  Tobacco Use   Smoking status: Former    Packs/day: 0.75    Years: 15.00    Pack years: 11.25    Types: Cigarettes    Quit date: 06/01/1986    Years since quitting: 35.1   Smokeless tobacco: Never  Vaping Use   Vaping Use: Never used  Substance and Sexual Activity   Alcohol use: Not Currently    Alcohol/week: 0.0 standard drinks    Comment: rare   Drug use: No   Sexual activity: Not Currently  Other Topics Concern   Not on file  Social History Narrative   Divorced. Long term boyfriend passed 2018.  Lives alone. 2 children (son in [place], daughter in [place]). 1 granddaughter in [place].       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    Caregiving for 3 people as of 2020   Social Determinants of Health   Financial Resource Strain: Low Risk    Difficulty of Paying Living Expenses: Not hard at all  Food Insecurity: No Food Insecurity   Worried About Charity fundraiser in the Last Year: Never true   Arboriculturist in the Last Year: Never true  Transportation Needs: No Transportation Needs   Lack of Transportation (Medical): No   Lack of Transportation (Non-Medical): No  Physical Activity: Inactive   Days of Exercise per Week: 0 days   Minutes of Exercise per Session: 0 min  Stress: No Stress Concern Present   Feeling of Stress : Not at all  Social Connections: Socially Isolated   Frequency of Communication with Friends and Family: More than three times a week    Frequency of Social Gatherings with Friends and Family: More than three times a week   Attends Religious Services: Never   Marine scientist or Organizations: No   Attends Music therapist: Never   Marital Status: Divorced    Tobacco Counseling Counseling given: Not Answered   Clinical Intake:  Pre-visit preparation completed: Yes  Pain : No/denies pain     BMI - recorded: 26.2 Nutritional Status: BMI 25 -29 Overweight Nutritional Risks:  None Diabetes: Yes CBG done?: No Did pt. bring in CBG monitor from home?: No  How often do you need to have someone help you when you read instructions, pamphlets, or other written materials from your doctor or pharmacy?: 1 - Never  Diabetic?Nutrition Risk Assessment:  Has the patient had any N/V/D within the last 2 months?  No  Does the patient have any non-healing wounds?  No  Has the patient had any unintentional weight loss or weight gain?  No   Diabetes:  Is the patient diabetic?  Yes  If diabetic, was a CBG obtained today?  No  Did the patient bring in their glucometer from home?  No  How often do you monitor your CBG's? N/A.   Financial Strains and Diabetes Management:  Are you having any financial strains with the device, your supplies or your medication? No .  Does the patient want to be seen by Chronic Care Management for management of their diabetes?  No  Would the patient like to be referred to a Nutritionist or for Diabetic Management?  No   Diabetic Exams:  Diabetic Eye Exam: Overdue for diabetic eye exam. Pt has been advised about the importance in completing this exam. Patient advised to call and schedule an eye exam. Diabetic Foot Exam: Overdue, Pt has been advised about the importance in completing this exam. Pt is scheduled for diabetic foot exam on next appt .   Interpreter Needed?: No  Information entered by :: Charlott Rakes, LPN   Activities of Daily Living In your present state of  health, do you have any difficulty performing the following activities: 07/21/2021  Hearing? N  Vision? N  Difficulty concentrating or making decisions? N  Walking or climbing stairs? N  Dressing or bathing? N  Doing errands, shopping? N  Preparing Food and eating ? N  Using the Toilet? N  In the past six months, have you accidently leaked urine? N  Do you have problems with loss of bowel control? N  Managing your Medications? N  Managing your Finances? N  Housekeeping or managing your Housekeeping? N  Some recent data might be hidden    Patient Care Team: Marin Olp, MD as PCP - General (Family Medicine) Werner Lean, MD as PCP - Cardiology (Cardiology)  Indicate any recent Medical Services you may have received from other than [facility] providers in the past year (date may be approximate).     Assessment:   This is a routine wellness examination for [place].  Hearing/Vision screen Hearing Screening - Comments:: Pt denies any hearing issues  Vision Screening - Comments:: Pt follows up with eye provider in [place] for annual eye exams   Dietary issues and exercise activities discussed: Current Exercise Habits: The patient does not participate in regular exercise at present   Goals Addressed             This Visit's Progress    Patient Stated       More exercise        Depression Screen PHQ 2/9 Scores 07/21/2021 07/15/2020 06/07/2020 04/11/2019 02/14/2019 01/17/2018 06/23/2016  PHQ - 2 Score 0 0 0 0 0 0 0  PHQ- 9 Score - - 1 - - - -    Fall Risk Fall Risk  07/21/2021 07/15/2020 06/07/2020 02/14/2019 01/17/2018  Falls in the past year? 0 1 0 0 No  Comment - - - - -  Number falls in past yr: 0 1 0 0 -  Injury  with Fall? 0 1 0 0 -  Comment - foot injury - - -  Risk for fall due to : Impaired vision Impaired mobility;Impaired balance/gait;Impaired vision - - -  Risk for fall due to: Comment - foot injury - - -  Follow up Falls prevention discussed - - - -     FALL RISK PREVENTION PERTAINING TO THE HOME:  Any stairs in or around the home? Yes  If so, are there any without handrails? No  Home free of loose throw rugs in walkways, pet beds, electrical Townsend, etc? Yes  Adequate lighting in your home to reduce risk of falls? Yes   ASSISTIVE DEVICES UTILIZED TO PREVENT FALLS:  Life alert? No  Use of a cane, walker or w/c? No  Grab bars in the bathroom? No  Shower chair or bench in shower? No  Elevated toilet seat or a handicapped toilet? No   TIMED UP AND GO:  Was the test performed? No .   Cognitive Function:     6CIT Screen 07/21/2021 07/15/2020  What Year? 0 points 0 points  What month? 0 points 0 points  What time? 0 points -  Count back from 20 0 points 0 points  Months in reverse 0 points 0 points  Repeat phrase 0 points 0 points  Total Score 0 -    Immunizations Immunization History  Administered Date(s) Administered   Td 07/31/2006    TDAP status: Due, Education has been provided regarding the importance of this vaccine. Advised may receive this vaccine at local pharmacy or Health Dept. Aware to provide a copy of the vaccination record if obtained from local pharmacy or Health Dept. Verbalized acceptance and understanding.  Flu Vaccine status: Declined, Education has been provided regarding the importance of this vaccine but patient still declined. Advised may receive this vaccine at local pharmacy or Health Dept. Aware to provide a copy of the vaccination record if obtained from local pharmacy or Health Dept. Verbalized acceptance and understanding.  Pneumococcal vaccine status: Declined,  Education has been provided regarding the importance of this vaccine but patient still declined. Advised may receive this vaccine at local pharmacy or Health Dept. Aware to provide a copy of the vaccination record if obtained from local pharmacy or Health Dept. Verbalized acceptance and understanding.   Covid-19 vaccine status:  Declined, Education has been provided regarding the importance of this vaccine but patient still declined. Advised may receive this vaccine at local pharmacy or Health Dept.or vaccine clinic. Aware to provide a copy of the vaccination record if obtained from local pharmacy or Health Dept. Verbalized acceptance and understanding.  Qualifies for Shingles Vaccine? Yes   Zostavax completed No   Shingrix Completed?: No.    Education has been provided regarding the importance of this vaccine. Patient has been advised to call insurance company to determine out of pocket expense if they have not yet received this vaccine. Advised may also receive vaccine at local pharmacy or Health Dept. Verbalized acceptance and understanding.  Screening Tests Health Maintenance  Topic Date Due   TETANUS/TDAP  07/30/2016   OPHTHALMOLOGY EXAM  06/14/2019   HEMOGLOBIN A1C  12/05/2020   FOOT EXAM  06/07/2021   URINE MICROALBUMIN  06/07/2021   MAMMOGRAM  07/23/2022   COLONOSCOPY (Pts 45-66yr Insurance coverage will need to be confirmed)  02/28/2024   DEXA SCAN  Completed   Hepatitis C Screening  Completed   HPV VACCINES  Aged Out   Pneumonia Vaccine 74 Years old  Discontinued   INFLUENZA VACCINE  Discontinued   COVID-19 Vaccine  Discontinued   Zoster Vaccines- Shingrix  Discontinued    Health Maintenance  Health Maintenance Due  Topic Date Due   TETANUS/TDAP  07/30/2016   OPHTHALMOLOGY EXAM  06/14/2019   HEMOGLOBIN A1C  12/05/2020   FOOT EXAM  06/07/2021   URINE MICROALBUMIN  06/07/2021    Colorectal cancer screening: Type of screening: Colonoscopy. Completed 02/28/19. Repeat every 5 years  Mammogram status: Completed 07/23/20. Repeat every year  Bone Density status: Completed 08/22/14. Results reflect: Bone density results: OSTEOPENIA. Repeat every 2 years.  Additional Screening:  Hepatitis C Screening:  Completed 08/30/06  Vision Screening: Recommended annual ophthalmology exams for early detection  of glaucoma and other disorders of the eye. Is the patient up to date with their annual eye exam?  Yes  Who is the provider or what is the name of the office in which the patient attends annual eye exams? Provider in [place] If pt is not established with a provider, would they like to be referred to a provider to establish care? No .   Dental Screening: Recommended annual dental exams for proper oral hygiene  Community Resource Referral / Chronic Care Management: CRR required this visit?  No   CCM required this visit?  No      Plan:     I have personally reviewed and noted the following in the patients chart:   Medical and social history Use of alcohol, tobacco or illicit drugs  Current medications and supplements including opioid prescriptions.  Functional ability and status Nutritional status Physical activity Advanced directives List of other physicians Hospitalizations, surgeries, and ER visits in previous 12 months Vitals Screenings to include cognitive, depression, and falls Referrals and appointments  In addition, I have reviewed and discussed with patient certain preventive protocols, quality metrics, and best practice recommendations. A written personalized care plan for preventive services as well as general preventive health recommendations were provided to patient.     Willette Brace, LPN   08/23/4980   Nurse Notes: None

## 2021-07-21 NOTE — Patient Instructions (Signed)
Heather Townsend , Thank you for taking time to come for your Medicare Wellness Visit. I appreciate your ongoing commitment to your health goals. Please review the following plan we discussed and let me know if I can assist you in the future.   Screening recommendations/referrals: Colonoscopy: Done 02/28/19 repeat every 5 years  Mammogram: Done 07/23/20 repeat every year  Bone Density: Done 08/22/14 repeat every 2 years  Recommended yearly ophthalmology/optometry visit for glaucoma screening and checkup Recommended yearly dental visit for hygiene and checkup  Vaccinations: Influenza vaccine: Declined and discussed Pneumococcal vaccine: Declined Tdap vaccine: Due  Shingles vaccine: Shingrix discussed. Please contact your pharmacy for coverage information.    Covid-19:Declined and discussed   Advanced directives: Advance directive discussed with you today. Even though you declined this today please call our office should you change your mind and we can give you the proper paperwork for you to fill out.  Conditions/risks identified: Exercise more   Next appointment: Follow up in one year for your annual wellness visit    Preventive Care 65 Years and Older, Female Preventive care refers to lifestyle choices and visits with your health care provider that can promote health and wellness. What does preventive care include? A yearly physical exam. This is also called an annual well check. Dental exams once or twice a year. Routine eye exams. Ask your health care provider how often you should have your eyes checked. Personal lifestyle choices, including: Daily care of your teeth and gums. Regular physical activity. Eating a healthy diet. Avoiding tobacco and drug use. Limiting alcohol use. Practicing safe sex. Taking low-dose aspirin every day. Taking vitamin and mineral supplements as recommended by your health care provider. What happens during an annual well check? The services and screenings  done by your health care provider during your annual well check will depend on your age, overall health, lifestyle risk factors, and family history of disease. Counseling  Your health care provider may ask you questions about your: Alcohol use. Tobacco use. Drug use. Emotional well-being. Home and relationship well-being. Sexual activity. Eating habits. History of falls. Memory and ability to understand (cognition). Work and work Statistician. Reproductive health. Screening  You may have the following tests or measurements: Height, weight, and BMI. Blood pressure. Lipid and cholesterol levels. These may be checked every 5 years, or more frequently if you are over 74 years old. Skin check. Lung cancer screening. You may have this screening every year starting at age 74 if you have a 30-pack-year history of smoking and currently smoke or have quit within the past 15 years. Fecal occult blood test (FOBT) of the stool. You may have this test every year starting at age 74. Flexible sigmoidoscopy or colonoscopy. You may have a sigmoidoscopy every 5 years or a colonoscopy every 10 years starting at age 74. Hepatitis C blood test. Hepatitis B blood test. Sexually transmitted disease (STD) testing. Diabetes screening. This is done by checking your blood sugar (glucose) after you have not eaten for a while (fasting). You may have this done every 1-3 years. Bone density scan. This is done to screen for osteoporosis. You may have this done starting at age 74. Mammogram. This may be done every 1-2 years. Talk to your health care provider about how often you should have regular mammograms. Talk with your health care provider about your test results, treatment options, and if necessary, the need for more tests. Vaccines  Your health care provider may recommend certain vaccines, such as: Influenza  vaccine. This is recommended every year. Tetanus, diphtheria, and acellular pertussis (Tdap, Td)  vaccine. You may need a Td booster every 10 years. Zoster vaccine. You may need this after age 74. Pneumococcal 13-valent conjugate (PCV13) vaccine. One dose is recommended after age 74. Pneumococcal polysaccharide (PPSV23) vaccine. One dose is recommended after age 18. Talk to your health care provider about which screenings and vaccines you need and how often you need them. This information is not intended to replace advice given to you by your health care provider. Make sure you discuss any questions you have with your health care provider. Document Released: 06/14/2015 Document Revised: 02/05/2016 Document Reviewed: 03/19/2015 Elsevier Interactive Patient Education  2017 [place] Prevention in the Home Falls can cause injuries. They can happen to people of all ages. There are many things you can do to make your home safe and to help prevent falls. What can I do on the outside of my home? Regularly fix the edges of walkways and driveways and fix any cracks. Remove anything that might make you trip as you walk through a door, such as a raised step or threshold. Trim any bushes or trees on the path to your home. Use bright outdoor lighting. Clear any walking paths of anything that might make someone trip, such as rocks or tools. Regularly check to see if handrails are loose or broken. Make sure that both sides of any steps have handrails. Any raised decks and porches should have guardrails on the edges. Have any leaves, snow, or ice cleared regularly. Use sand or salt on walking paths during winter. Clean up any spills in your garage right away. This includes oil or grease spills. What can I do in the bathroom? Use night lights. Install grab bars by the toilet and in the tub and shower. Do not use towel bars as grab bars. Use non-skid mats or decals in the tub or shower. If you need to sit down in the shower, use a plastic, non-slip stool. Keep the floor dry. Clean up any  water that spills on the floor as soon as it happens. Remove soap buildup in the tub or shower regularly. Attach bath mats securely with double-sided non-slip rug tape. Do not have throw rugs and other things on the floor that can make you trip. What can I do in the bedroom? Use night lights. Make sure that you have a light by your bed that is easy to reach. Do not use any sheets or blankets that are too big for your bed. They should not hang down onto the floor. Have a firm chair that has side arms. You can use this for support while you get dressed. Do not have throw rugs and other things on the floor that can make you trip. What can I do in the kitchen? Clean up any spills right away. Avoid walking on wet floors. Keep items that you use a lot in easy-to-reach places. If you need to reach something above you, use a strong step stool that has a grab bar. Keep electrical cords out of the way. Do not use floor polish or wax that makes floors slippery. If you must use wax, use non-skid floor wax. Do not have throw rugs and other things on the floor that can make you trip. What can I do with my stairs? Do not leave any items on the stairs. Make sure that there are handrails on both sides of the stairs and use them. Fix  handrails that are broken or loose. Make sure that handrails are as long as the stairways. Check any carpeting to make sure that it is firmly attached to the stairs. Fix any carpet that is loose or worn. Avoid having throw rugs at the top or bottom of the stairs. If you do have throw rugs, attach them to the floor with carpet tape. Make sure that you have a light switch at the top of the stairs and the bottom of the stairs. If you do not have them, ask someone to add them for you. What else can I do to help prevent falls? Wear shoes that: Do not have high heels. Have rubber bottoms. Are comfortable and fit you well. Are closed at the toe. Do not wear sandals. If you use a  stepladder: Make sure that it is fully opened. Do not climb a closed stepladder. Make sure that both sides of the stepladder are locked into place. Ask someone to hold it for you, if possible. Clearly mark and make sure that you can see: Any grab bars or handrails. First and last steps. Where the edge of each step is. Use tools that help you move around (mobility aids) if they are needed. These include: Canes. Walkers. Scooters. Crutches. Turn on the lights when you go into a dark area. Replace any light bulbs as soon as they burn out. Set up your furniture so you have a clear path. Avoid moving your furniture around. If any of your floors are uneven, fix them. If there are any pets around you, be aware of where they are. Review your medicines with your doctor. Some medicines can make you feel dizzy. This can increase your chance of falling. Ask your doctor what other things that you can do to help prevent falls. This information is not intended to replace advice given to you by your health care provider. Make sure you discuss any questions you have with your health care provider. Document Released: 03/14/2009 Document Revised: 10/24/2015 Document Reviewed: 06/22/2014 Elsevier Interactive Patient Education  2017 [employer].

## 2021-08-08 DIAGNOSIS — M5441 Lumbago with sciatica, right side: Secondary | ICD-10-CM | POA: Diagnosis not present

## 2021-08-11 DIAGNOSIS — M545 Low back pain, unspecified: Secondary | ICD-10-CM | POA: Diagnosis not present

## 2021-08-18 DIAGNOSIS — M545 Low back pain, unspecified: Secondary | ICD-10-CM | POA: Diagnosis not present

## 2021-10-20 ENCOUNTER — Other Ambulatory Visit: Payer: Self-pay | Admitting: Nurse Practitioner

## 2021-10-20 DIAGNOSIS — K7460 Unspecified cirrhosis of liver: Secondary | ICD-10-CM

## 2021-10-20 DIAGNOSIS — K7581 Nonalcoholic steatohepatitis (NASH): Secondary | ICD-10-CM | POA: Diagnosis not present

## 2021-10-20 DIAGNOSIS — I8500 Esophageal varices without bleeding: Secondary | ICD-10-CM | POA: Diagnosis not present

## 2021-11-06 ENCOUNTER — Telehealth: Payer: Self-pay | Admitting: Family Medicine

## 2021-11-06 NOTE — Telephone Encounter (Signed)
See routing note.

## 2021-11-07 NOTE — Telephone Encounter (Signed)
FYI-- Called Pt on 11/07/21 and LVM to schedule an OV as soon as possible.

## 2021-11-10 ENCOUNTER — Ambulatory Visit
Admission: RE | Admit: 2021-11-10 | Discharge: 2021-11-10 | Disposition: A | Discharge status: Home / Self Care | Payer: Medicare Other | Source: Ambulatory Visit | Attending: Nurse Practitioner | Admitting: Nurse Practitioner

## 2021-11-10 DIAGNOSIS — K7460 Unspecified cirrhosis of liver: Secondary | ICD-10-CM | POA: Diagnosis not present

## 2022-02-17 DIAGNOSIS — G603 Idiopathic progressive neuropathy: Secondary | ICD-10-CM | POA: Diagnosis not present

## 2022-02-17 DIAGNOSIS — R202 Paresthesia of skin: Secondary | ICD-10-CM | POA: Diagnosis not present

## 2022-02-17 DIAGNOSIS — R252 Cramp and spasm: Secondary | ICD-10-CM | POA: Diagnosis not present

## 2022-02-17 DIAGNOSIS — M5412 Radiculopathy, cervical region: Secondary | ICD-10-CM | POA: Diagnosis not present

## 2022-02-17 DIAGNOSIS — M5417 Radiculopathy, lumbosacral region: Secondary | ICD-10-CM | POA: Diagnosis not present

## 2022-02-17 DIAGNOSIS — G3184 Mild cognitive impairment, so stated: Secondary | ICD-10-CM | POA: Diagnosis not present

## 2022-02-18 ENCOUNTER — Ambulatory Visit (INDEPENDENT_AMBULATORY_CARE_PROVIDER_SITE_OTHER): Payer: Medicare Other | Admitting: Pulmonary Disease

## 2022-02-18 ENCOUNTER — Encounter: Payer: Self-pay | Admitting: Pulmonary Disease

## 2022-02-18 VITALS — BP 124/66 | HR 89 | Temp 98.2°F | Ht 64.5 in | Wt 139.0 lb

## 2022-02-18 DIAGNOSIS — J8410 Pulmonary fibrosis, unspecified: Secondary | ICD-10-CM | POA: Diagnosis not present

## 2022-02-18 DIAGNOSIS — R0600 Dyspnea, unspecified: Secondary | ICD-10-CM | POA: Diagnosis not present

## 2022-02-18 NOTE — Progress Notes (Signed)
       Heather Townsend    [id number]    04/09/1946  Primary Care Physician:Hunter, Stephen O, MD  Referring Physician: Hunter, Stephen O, MD [address] [place],  [place] [id number] Chief complaint:   Follow up for lung fibrosis.  HPI: Heather Townsend is a 76 y.o. with history of Nash cirrhosis, GERD, hiatal hernia. She had an MRI of the abdomen as a part of follow-up for cirrhosis. It showed fibrosis at lung bases. She has been referred here for further follow-up. She has some mild dyspnea on exertion but no other symptoms confined to the chest. She denies any cough, wheezing, sputum production, hemoptysis. She does not have any symptoms suggestive of connective tissue, autoimmune disease. She does have history of unspecified lung fibrosis in her mother and aunt. She does not have any exposures at work to asbestos or other inhalants.  Evaluated by Dr. Aryal on November 2020 for elevated antibodies with no evidence of autoimmune disease Received rheumatology clinic note from Dr. Aryal, [facility] dated 04/11/2019. Repeat labs including CK, aldolase, CRP negative, sed rate 40.   No concern for active connective tissue disease.  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  Records show that her follow-up CT was canceled and she was unable to be reached to make clinic visit appointment  States that her dyspnea is worse compared to last time.  She would like to have her lungs reevaluated again.  Outpatient Encounter Medications as of 02/18/2022  Medication Sig   aspirin 81 MG chewable tablet Chew 81 mg by mouth daily as needed for mild pain.   Cyanocobalamin 1000 MCG/ML KIT Inject 1,000 mg as directed. Pt does self injections twice a month.   esomeprazole (NEXIUM) 40 MG packet Take 40 mg by mouth 3 (three) times a week.   Multiple Vitamin (MULTIVITAMIN ADULT  PO) Take by mouth. "One a day"   oxyCODONE (ROXICODONE) 5 MG immediate release tablet Take 1 tablet (5 mg total) by mouth every 6 (six) hours as needed for severe pain.   Probiotic Product (PROBIOTIC PO) Take by mouth.   [DISCONTINUED] metFORMIN (GLUCOPHAGE) 500 MG tablet Take 1 tablet (500 mg total) by mouth 2 (two) times daily with a meal.   Facility-Administered Encounter Medications as of 02/18/2022  Medication   regadenoson (LEXISCAN) injection SOLN 0.4 mg   Physical Exam: Blood pressure 124/66, pulse 89, temperature 98.2 F (36.8 C), temperature source Oral, height 5' 4.5" (1.638 m), weight 139 lb (63 kg), SpO2 100 %. Gen:      No acute distress HEENT:  EOMI, sclera anicteric Neck:     No masses; no thyromegaly Lungs:    Clear to auscultation bilaterally; normal respiratory effort CV:         Regular rate and rhythm; no murmurs Abd:      + bowel sounds; soft, non-tender; no palpable masses, no distension Ext:    No edema; adequate peripheral perfusion Skin:      Warm and dry; no rash Neuro: alert and oriented x 3 Psych: normal mood and affect   Data Reviewed: CT abdomen 12/04/14- Reticulation at bases, unchanged since 2011 CT abdomen 06/02/11- Reticulation at bases. Images reviewed. 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 NSIP fibrosis High res CT 10/01/16- ILD, fibrosis, unchanged.  High res CT 10/20/17-mild progression pulmonary fibrosis, possible honeycombing   High-res CT 04/04/2019-mild progression of pulmonary fibrosis in UIP pattern. CT chest 10/26/2019-stable pulmonary fibrosis I have reviewed all images personally.   Serologies 09/05/15 CRP 0.4, sedimentation rate 43 Aldolase 11.2 P-ANCA 1:40 dsDNA- 6 Negative- Jo-1, centromere, RNP, RO, LA RA, CCP, Scl 70   PFTs 09/29/15 FVC 2.45 (80%), FEV1 2.18 (93%), F/F 89, TLC 79%, DLCO 53% Mild restriction, moderately severe diffusion defect.  6-minute walk test 04/11/2019-510 m, exertional O2 sat  93%  Assessment:  Idiopathic pulmonary fibrosis Although there is minimal progression on recent CT compared to 2019 there is clearly worsening compared to her earliest high-res CT in 2017.  The pattern is UIP suggestive of idiopathic pulmonary fibrosis.  She does have a strong family history of pulmonary fibrosis in her mother and aunt. She does not have any exposure history or symptoms suggestive of connective tissue, autoimmune disease. Serologies are negative except for intermediate elevation in double-stranded DNA and mild elevation in ANCA of unclear significance.    Given progression over time we discussed again next steps for work-up and diagnosis.  She had declined lung biopsy in the past but at this point I do not think we need to proceed with that as the pattern is now is UIP.    Discussed antifibrotics but patient had been reluctant to try these in the past due to concern for liver damage in the setting of Nash cirrhosis.  Now with worsening symptoms she would like to reevaluate and is willing to consider treatment We will get follow-up CT, CMP and proBNP.  We discussed PFTs but she would like to avoid these  Health maintenance Does not want to get vaccinations.  Plan/Recommendations: - Ongoing discussion on antifibrotics - High-res CT, CMP, proBNP  Marshell Garfinkel MD [facility] 02/18/2022, 4:25 PM  CC: Marin Olp, MD

## 2022-02-18 NOTE — Patient Instructions (Signed)
We will get some labs today including comprehensive metabolic panel and proBNP We will order high-resolution CT at next available Follow-up in 1 month for reevaluation.

## 2022-02-19 LAB — COMPREHENSIVE METABOLIC PANEL
ALT: 59 U/L — ABNORMAL HIGH (ref 0–35)
AST: 64 U/L — ABNORMAL HIGH (ref 0–37)
Albumin: 3.8 g/dL (ref 3.5–5.2)
Alkaline Phosphatase: 154 U/L — ABNORMAL HIGH (ref 39–117)
BUN: 11 mg/dL (ref 6–23)
CO2: 30 mEq/L (ref 19–32)
Calcium: 9.5 mg/dL (ref 8.4–10.5)
Chloride: 98 mEq/L (ref 96–112)
Creatinine, Ser: 0.66 mg/dL (ref 0.40–1.20)
GFR: 86.33 mL/min (ref >60.00)
Glucose, Bld: 353 mg/dL — ABNORMAL HIGH (ref 70–99)
Potassium: 4.4 mEq/L (ref 3.5–5.1)
Sodium: 134 mEq/L — ABNORMAL LOW (ref 135–145)
Total Bilirubin: 0.7 mg/dL (ref 0.2–1.2)
Total Protein: 7.7 g/dL (ref 6.0–8.3)

## 2022-02-19 LAB — PRO B NATRIURETIC PEPTIDE: NT-Pro BNP: 59 pg/mL (ref 0–301)

## 2022-03-02 ENCOUNTER — Encounter: Payer: Self-pay | Admitting: Family Medicine

## 2022-03-02 ENCOUNTER — Telehealth: Payer: Self-pay | Admitting: Pulmonary Disease

## 2022-03-02 ENCOUNTER — Ambulatory Visit (INDEPENDENT_AMBULATORY_CARE_PROVIDER_SITE_OTHER): Payer: Medicare Other | Admitting: Family Medicine

## 2022-03-02 VITALS — BP 130/70 | HR 77 | Temp 98.3°F | Ht 64.5 in | Wt 138.4 lb

## 2022-03-02 DIAGNOSIS — I8510 Secondary esophageal varices without bleeding: Secondary | ICD-10-CM

## 2022-03-02 DIAGNOSIS — K7581 Nonalcoholic steatohepatitis (NASH): Secondary | ICD-10-CM

## 2022-03-02 DIAGNOSIS — E1165 Type 2 diabetes mellitus with hyperglycemia: Secondary | ICD-10-CM | POA: Diagnosis not present

## 2022-03-02 DIAGNOSIS — E538 Deficiency of other specified B group vitamins: Secondary | ICD-10-CM

## 2022-03-02 DIAGNOSIS — E785 Hyperlipidemia, unspecified: Secondary | ICD-10-CM

## 2022-03-02 DIAGNOSIS — J8410 Pulmonary fibrosis, unspecified: Secondary | ICD-10-CM

## 2022-03-02 DIAGNOSIS — R0602 Shortness of breath: Secondary | ICD-10-CM | POA: Diagnosis not present

## 2022-03-02 DIAGNOSIS — R002 Palpitations: Secondary | ICD-10-CM | POA: Diagnosis not present

## 2022-03-02 NOTE — Progress Notes (Signed)
Phone [phone] In person visit   Subjective:   Heather Townsend is a 76 y.o. year old very pleasant female patient who presents for/with See problem oriented charting Chief Complaint  Patient presents with   Diabetes   Follow-up   Shortness of Breath    Pt c/o of shortness of breath when rolling over in bed, couple of weeks   Chest Pain   Past Medical History-  Patient Active Problem List   Diagnosis Date Noted   Pulmonary fibrosis ([place]) 02/14/2019    Priority: High   Shortness of breath 08/16/2015    Priority: High   Type 2 diabetes mellitus ([place]) 03/08/2014    Priority: High   NASH (nonalcoholic steatohepatitis) 04/04/2013    Priority: High   Esophageal varices without bleeding ([place]) 02/14/2019    Priority: Medium    Left thyroid nodule 11/06/2015    Priority: Medium    Cervical disc disease 04/16/2014    Priority: Medium    GERD 09/05/2007    Priority: Medium    Dyslipidemia 08/16/2007    Priority: Medium    Vitamin B12 deficiency 01/17/2018    Priority: Low   Osteopenia 03/07/2014    Priority: Low   Rash, skin 01/25/2013    Priority: Low   Chest pain 05/11/2012    Priority: Low   Renal calculus or stone 04/27/2011    Priority: Low   Chest pain of uncertain etiology 92/42/6834   Hyperlipidemia 06/27/2020   Palpitations 06/27/2020   Cirrhosis of liver without ascites ([place]) 06/27/2020   Uncontrolled type 2 diabetes mellitus with hyperglycemia ([place]) 02/14/2019   Trigeminal neuralgia 02/14/2019    Medications- reviewed and updated Current Outpatient Medications  Medication Sig Dispense Refill   aspirin 81 MG chewable tablet Chew 81 mg by mouth daily as needed for mild pain.     Cyanocobalamin 1000 MCG/ML KIT Inject 1,000 mg as directed. Pt does self injections twice a month.     esomeprazole (NEXIUM) 40 MG packet Take 40 mg by mouth 3 (three) times a week.     Multiple Vitamin (MULTIVITAMIN ADULT PO) Take by mouth. "One a day"     No current  facility-administered medications for this visit.     Objective:  BP 130/70   Pulse 77   Temp 98.3 F (36.8 C)   Ht 5' 4.5" (1.638 m)   Wt 138 lb 6.4 oz (62.8 kg)   SpO2 94%   BMI 23.39 kg/m  Gen: NAD, resting comfortably CV: RRR no murmurs rubs or gallops Lungs: CTAB no crackles, wheeze, rhonchi Ext: no edema Skin: warm, dry  Diabetic Foot Exam - Simple   Simple Foot Form Diabetic Foot exam was performed with the following findings: Yes 03/02/2022  3:59 PM  Visual Inspection No deformities, no ulcerations, no other skin breakdown bilaterally: Yes Sensation Testing Intact to touch and monofilament testing bilaterally: Yes Pulse Check Posterior Tibialis and Dorsalis pulse intact bilaterally: Yes Comments    EKG: normal sinus rhythm with rate 84, normal axis, normal intervals, no hypertrophy, no st or t wave changes- unchanged from 05/11/12 ekg     Assessment and Plan   # Pulmonary fibrosis- follows with pulmonology and rheumatology S:seen by [name] 02/18/22 concern for UIP/ possible IPF(states biopsy not required now) - plan was for Ct, CMP, proBNP. Declined PFTs. Wants to hold off on vaccines. Consideration of antifibrotics.  A/P: labs reassuring and pending CT to monitor trend- continue to monitor with [name]- not on antifibrotics  yet but consideirng   # Shortness of breath #Chest pain S: has happened twice in last 2 weeks- was laying on right side both times and rolled to left and felt suddenly short of breath and woke her up.  Resolved after 2-3 minutes even without changing position. Pro bnp with [name] on 02/18/22 was reassuring.   Also had palpitations 3-4 x in last month with activity - after finishes getting dressed goes away.   Also has had left chest pain but not in combo with the SOB. 3-4 x in the last year. Never exertional . Usually at rest. Baby aspirin takes but pain relieves within a minute before aspirin even has chance to take effect A/P:  shortness of breath at night mainly positional- does not appear to be PND or orthopnea plus had reassuring pro bnp recently. Does have baseline shortness of breath with activity that could be from pulmonary fibrosis changes but also has had palpitations- we discussed referral back to cardiology- and she agrees.   # NASH/overweight S:follows with [name] [facility]  -down 17 lbs from last years  (noted even more weight loss on home scales/comparisons with other offices). Cut down on portions-also hoping to help with a1c A/P: NASH appears to be stable- mild LFT elevations. Continue follow up with [facility] liver care. Encouraged updating EGD as below  # Diabetes S: Medication: off metformin - did not really take much  -possibly  peeing more- wonder if sugar could be higher especially off meds  CBGs- doesn't check her sugar Lab Results  Component Value Date   HGBA1C 10.6 (H) 06/07/2020   HGBA1C 11.8 (H) 02/14/2019   HGBA1C 7.9 (H) 01/17/2018   A/P: unknown control- update a1c with labs -discussed several option  #hyperlipidemia S: Medication: asa 81 mg for primary prevention. Patient declines statin firmly despite having diabetes and aortic atherosclerosis (also she is more hesitant due to liver which I understand) Lab Results  Component Value Date   CHOL 179 06/07/2020   HDL 47.70 06/07/2020   LDLCALC 112 (H) 06/07/2020   LDLDIRECT 148.3 11/28/2012   TRIG 96.0 06/07/2020   CHOLHDL 4 06/07/2020   A/P: not interested in statin- hoping improved with weight loss - update lipids today. Could consider zetia  #esophageal varices- last EGD sept 2020 [name] Plan- protonix 3x a week and not on beta blocker. [name] advised and I did too as well today.   # B12 deficiency S: Current treatment/medication (oral vs. IM):  self injects twice a month Lab Results  Component Value Date   [id number] 673 (H) 06/07/2020  A/P: apparently managed by neurology lately and placed on once a week-  since they are managing we will hold off on repeat today   #HM- wants to hold off on all vaccinations  #pinched nerve in neck- on oxycodone through neurology in [place]  Recommended follow up: Return in about 6 months (around 09/01/2022) for followup or sooner if needed.Schedule b4 you leave. Future Appointments  Date Time Provider [place]  04/08/2022  4:00 PM Marshell Garfinkel, MD [facility] None  08/03/2022 11:45 AM [facility]   Lab/Order associations:   ICD-10-CM   1. Uncontrolled type 2 diabetes mellitus with hyperglycemia (HCC)  E11.65 CBC with Differential/Platelet    Comprehensive metabolic panel    Hemoglobin A1c    Microalbumin / creatinine urine ratio    CANCELED: Hemoglobin A1c    CANCELED: Microalbumin / creatinine urine ratio  2. Pulmonary fibrosis ([place])  J84.10     3. Dyslipidemia  E78.5 TSH    CBC with Differential/Platelet    Comprehensive metabolic panel    Lipid panel    4. Secondary esophageal varices without bleeding (HCC) Chronic I85.10     5. NASH (nonalcoholic steatohepatitis)  K75.81     6. Vitamin B12 deficiency  E53.8 Vitamin B12    7. SOB (shortness of breath)  R06.02 EKG 12-Lead    Ambulatory referral to Cardiology    8. Palpitations  R00.2 Ambulatory referral to Cardiology      No orders of the defined types were placed in this encounter.   Return precautions advised.  Garret Reddish, MD

## 2022-03-02 NOTE — Telephone Encounter (Signed)
Patient inquiring about scheduling CT Scan- per Dr. Matilde Bash notes, "will order high-resolution CT at next available".  Please advise and update patient.

## 2022-03-02 NOTE — Patient Instructions (Addendum)
Sign release of information at the check out desk for Diabetic Eye Exam.  Please stop by lab before you go If you have mychart- we will send your results within 3 business days of [place] receiving them.  If you do not have mychart- we will call you about results within 5 business days of [place] receiving them.  *please also note that you will see labs on mychart as soon as they post. I will later go in and write notes on them- will say "notes from Dr. Yong Channel"   Recommended follow up: Return in about 6 months (around 09/01/2022) for followup or sooner if needed.Schedule b4 you leave.

## 2022-03-02 NOTE — Telephone Encounter (Signed)
Called patient but she did not answer. Left message for her to call back. [name] did mention a CT at her last visit. Order has been placed.

## 2022-03-02 NOTE — Telephone Encounter (Signed)
Sched for 11/1 at 10:00.  Spoke to pt & gave her appt info.  Nothing further needed.

## 2022-03-03 LAB — CBC WITH DIFFERENTIAL/PLATELET
Basophils Absolute: 0.1 10*3/uL (ref 0.0–0.1)
Basophils Relative: 1.2 % (ref 0.0–3.0)
Eosinophils Absolute: 0.4 10*3/uL (ref 0.0–0.7)
Eosinophils Relative: 5.9 % — ABNORMAL HIGH (ref 0.0–5.0)
HCT: 45.9 % (ref 36.0–46.0)
Hemoglobin: 15.8 g/dL — ABNORMAL HIGH (ref 12.0–15.0)
Lymphocytes Relative: 27.9 % (ref 12.0–46.0)
Lymphs Abs: 1.7 10*3/uL (ref 0.7–4.0)
MCHC: 34.4 g/dL (ref 30.0–36.0)
MCV: 92.7 fl (ref 78.0–100.0)
Monocytes Absolute: 0.4 10*3/uL (ref 0.1–1.0)
Monocytes Relative: 6.4 % (ref 3.0–12.0)
Neutro Abs: 3.5 10*3/uL (ref 1.4–7.7)
Neutrophils Relative %: 58.6 % (ref 43.0–77.0)
Platelets: 109 10*3/uL — ABNORMAL LOW (ref 150.0–400.0)
RBC: 4.96 Mil/uL (ref 3.87–5.11)
RDW: 13.7 % (ref 11.5–15.5)
WBC: 6 10*3/uL (ref 4.0–10.5)

## 2022-03-03 LAB — COMPREHENSIVE METABOLIC PANEL
ALT: 59 U/L — ABNORMAL HIGH (ref 0–35)
AST: 69 U/L — ABNORMAL HIGH (ref 0–37)
Albumin: 4 g/dL (ref 3.5–5.2)
Alkaline Phosphatase: 129 U/L — ABNORMAL HIGH (ref 39–117)
BUN: 14 mg/dL (ref 6–23)
CO2: 28 mEq/L (ref 19–32)
Calcium: 9.6 mg/dL (ref 8.4–10.5)
Chloride: 98 mEq/L (ref 96–112)
Creatinine, Ser: 0.67 mg/dL (ref 0.40–1.20)
GFR: 86 mL/min (ref >60.00)
Glucose, Bld: 223 mg/dL — ABNORMAL HIGH (ref 70–99)
Potassium: 3.8 mEq/L (ref 3.5–5.1)
Sodium: 136 mEq/L (ref 135–145)
Total Bilirubin: 0.8 mg/dL (ref 0.2–1.2)
Total Protein: 8 g/dL (ref 6.0–8.3)

## 2022-03-03 LAB — LIPID PANEL
Cholesterol: 168 mg/dL (ref 0–200)
HDL: 54.1 mg/dL (ref >39.00)
LDL Cholesterol: 93 mg/dL (ref 0–99)
NonHDL: 114.18
Total CHOL/HDL Ratio: 3
Triglycerides: 105 mg/dL (ref 0.0–149.0)
VLDL: 21 mg/dL (ref 0.0–40.0)

## 2022-03-03 LAB — VITAMIN B12: Vitamin B-12: 734 pg/mL (ref 211–911)

## 2022-03-03 LAB — HEMOGLOBIN A1C: Hgb A1c MFr Bld: 14.1 % — ABNORMAL HIGH (ref 4.6–6.5)

## 2022-03-03 LAB — MICROALBUMIN / CREATININE URINE RATIO
Creatinine,U: 115.9 mg/dL
Microalb Creat Ratio: 1.4 mg/g (ref 0.0–30.0)
Microalb, Ur: 1.6 mg/dL (ref 0.0–1.9)

## 2022-03-03 LAB — TSH: TSH: 4.24 u[IU]/mL (ref 0.35–5.50)

## 2022-03-04 ENCOUNTER — Ambulatory Visit (INDEPENDENT_AMBULATORY_CARE_PROVIDER_SITE_OTHER): Payer: Medicare Other

## 2022-03-04 ENCOUNTER — Encounter: Payer: Self-pay | Admitting: Internal Medicine

## 2022-03-04 ENCOUNTER — Ambulatory Visit: Payer: Medicare Other | Attending: Internal Medicine | Admitting: Internal Medicine

## 2022-03-04 ENCOUNTER — Other Ambulatory Visit: Payer: Self-pay

## 2022-03-04 VITALS — BP 140/70 | HR 93 | Ht 64.5 in | Wt 138.4 lb

## 2022-03-04 DIAGNOSIS — I700 Atherosclerosis of aorta: Secondary | ICD-10-CM | POA: Insufficient documentation

## 2022-03-04 DIAGNOSIS — E119 Type 2 diabetes mellitus without complications: Secondary | ICD-10-CM | POA: Diagnosis not present

## 2022-03-04 DIAGNOSIS — I10 Essential (primary) hypertension: Secondary | ICD-10-CM | POA: Insufficient documentation

## 2022-03-04 DIAGNOSIS — R0602 Shortness of breath: Secondary | ICD-10-CM | POA: Insufficient documentation

## 2022-03-04 DIAGNOSIS — I471 Supraventricular tachycardia, unspecified: Secondary | ICD-10-CM | POA: Diagnosis not present

## 2022-03-04 MED ORDER — EMPAGLIFLOZIN 10 MG PO TABS: 10.0000 mg | ORAL_TABLET | Freq: Every day | ORAL | 5 refills | Status: DC

## 2022-03-04 NOTE — Progress Notes (Signed)
Cardiology Office Note:    Date:  03/04/2022   ID:  Heather Townsend, DOB 05/07/1946, MRN [id number]  PCP:  Marin Olp, MD  [facility] Cardiologist:  Werner Lean, MD  [facility] Electrophysiologist:  None   CC: SVT f/u  History of Present Illness:    Heather Townsend is a 76 y.o. female with a hx of DM with HLD. Aortic atherosclerosis, NASH with Cirrhosis ([facility]), ILD, MVP who presented for evaluation 06/27/20. 2022: Stress test normal, asymptomatic SVT.  Lost to follow up.  Patient notes that she is doing OK.   Since last visit notes  . There are no interval hospital/ED visit.    No chest pain or pressure.  She has SOB that is related to her ILD (she notes pushing a mower).  She has other SOB that she thinks is heart related.  Sudden onset that can occur during sleep.  She notes that during her ziopatch she was unaware that she was supposed to use the trigger to trigger her symptoms.   Past Medical History:  Diagnosis Date   Allergy    Cirrhosis ([place])    Diabetes mellitus without complication ([place])    Diverticulosis    DIVERTICULOSIS, COLON 01/03/2007   Fatty infiltration of liver    GERD (gastroesophageal reflux disease)    Hepatitis, chronic active ([place])    NASH   Hiatal hernia    HIATAL HERNIA 12/07/2006   Hyperlipidemia    Interstitial lung disease (HCC)    Low back pain    Tuberculosis    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    Past Surgical History:  Procedure Laterality Date   APPENDECTOMY     EYE SURGERY     macular hole   ganglion cysts     left hand   HAND SURGERY  1996   SHOULDER SURGERY  2006   rt shoulder   TONSILLECTOMY     TUBAL LIGATION     UTERINE SUSPENSION      Current Medications: Current Meds  Medication Sig   aspirin 81 MG chewable tablet Chew 81 mg by mouth daily as needed for mild pain.   Cyanocobalamin 1000 MCG/ML KIT Inject 1,000 mg as directed. Pt does self injections twice a month.   empagliflozin  (JARDIANCE) 10 MG TABS tablet Take 1 tablet (10 mg total) by mouth daily before breakfast.   esomeprazole (NEXIUM) 40 MG packet Take 40 mg by mouth 3 (three) times a week.   Multiple Vitamin (MULTIVITAMIN ADULT PO) Take by mouth. "One a day"     Allergies:   Sulfasalazine, Celecoxib, Codeine, Ibuprofen, Meloxicam, Nsaids, Rofecoxib, Sulfa antibiotics, and Sulfonamide derivatives   Social History   Socioeconomic History   Marital status: Divorced    Spouse name: Not on file   Number of children: Not on file   Years of education: Not on file   Highest education level: Not on file  Occupational History   Occupation: Glass blower/designer   Occupation: care giver  Tobacco Use   Smoking status: Former    Packs/day: 0.75    Years: 15.00    Total pack years: 11.25    Types: Cigarettes    Quit date: 06/01/1986    Years since quitting: 35.7   Smokeless tobacco: Never  Vaping Use   Vaping Use: Never used  Substance and Sexual Activity   Alcohol use: Not Currently    Alcohol/week: 0.0 standard drinks of alcohol  Comment: rare   Drug use: No   Sexual activity: Not Currently  Other Topics Concern   Not on file  Social History Narrative   Divorced. Long term boyfriend passed 2018.  Lives alone. 2 children (son in [place], daughter in [place]). 1 granddaughter in [place].       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    Social Determinants of Health   Financial Resource Strain: Low Risk  (07/21/2021)   Overall Financial Resource Strain (CARDIA)    Difficulty of Paying Living Expenses: Not hard at all  Food Insecurity: No Food Insecurity (07/21/2021)   Hunger Vital Sign    Worried About Running Out of Food in the Last Year: Never true    Ran Out of Food in the Last Year: Never true  Transportation Needs: No Transportation Needs (07/21/2021)    PRAPARE - Hydrologist (Medical): No    Lack of Transportation (Non-Medical): No  Physical Activity: Inactive (07/21/2021)   Exercise Vital Sign    Days of Exercise per Week: 0 days    Minutes of Exercise per Session: 0 min  Stress: No Stress Concern Present (07/21/2021)   [place]    Feeling of Stress : Not at all  Social Connections: Socially Isolated (07/21/2021)   Social Connection and Isolation Panel [NHANES]    Frequency of Communication with Friends and Family: More than three times a week    Frequency of Social Gatherings with Friends and Family: More than three times a week    Attends Religious Services: Never    Marine scientist or Organizations: No    Attends Archivist Meetings: Never    Marital Status: Divorced     Family History: The patient's family history includes Cancer in her maternal aunt; Colon cancer in her paternal uncle; Diabetes in her mother; Esophageal cancer in her maternal aunt and maternal uncle; Heart disease in her mother; Pancreatic cancer in her paternal uncle; Stomach cancer in her maternal aunt, maternal uncle, and paternal uncle. There is no history of Rectal cancer.  History of coronary artery disease notable for no members. History of heart failure notable for mother. History of arrhythmia notable for atrial fibrillation in mother and sisters.  ROS:   Please see the history of present illness.     All other systems reviewed and are negative.  EKGs/Labs/Other Studies Reviewed:    The following studies were reviewed today:  EKG:   06/27/20: SR rate 77 with nonspecific TWI  Transthoracic Echocardiogram: Date: 08/12/2007 Results: normal biventricular function SUMMARY   -  Overall left ventricular systolic function was normal. Left         ventricular ejection fraction was estimated to be 65 %. There         were no left ventricular  regional wall motion abnormalities.   -  The left atrium was mildly dilated.    NonCardiac CT: Date: 10/26/19 Results:Aortic root atherosclerosis without coronary artery calcification and with notable hepatomegaly  GATED SPECT MYO PERF W/EXERCISE STRESS 1D 07/11/2020  Narrative  The left ventricular ejection fraction is hyperdynamic (>65%).  Nuclear stress EF: 74%.  There was no ST segment deviation noted during stress.  Blood pressure demonstrated a normal response to exercise.  The study is normal.  This is a low risk study.  Normal exercise nuclear stress test with no evidence for prior infarct or ischemia. Normal LVEF. Normal exercise tolerance. Normal BP response to exertion.   LONG TERM MONITOR (8-14 DAYS) INTERPRETATION 07/25/2020  Narrative  Patient had a minimum heart rate of 53 bpm, maximum heart rate of 187 bpm, and average heart rate of 80 bpm.  Predominant underlying rhythm was sinus rhythm.  Fifteen runs of supraventricular tachycardia occurred lasting 15 beats at longest with a max rate of 187 bpm at fastest.  Isolated PACs were rare (<1.0%), with rare couplets or triplets present.  Isolated PVCs were rare (<1.0%).  No evidence of complete heart block.  No triggered and diary events.  Asymptomatic SVT.   Stress Echo: Date:05/31/2012 Results: No resting or exercise WMAs   Recent Labs: 02/18/2022: NT-Pro BNP 59 03/02/2022: ALT 59; BUN 14; Creatinine, Ser 0.67; Hemoglobin 15.8; Platelets 109.0; Potassium 3.8; Sodium 136; TSH 4.24  Recent Lipid Panel    Component Value Date/Time   CHOL 168 03/02/2022 1604   TRIG 105.0 03/02/2022 1604   HDL 54.10 03/02/2022 1604   CHOLHDL 3 03/02/2022 1604   VLDL 21.0 03/02/2022 1604   LDLCALC 93 03/02/2022 1604   LDLDIRECT 148.3 11/28/2012 0905     Physical Exam:    VS:  BP (!) 140/70   Pulse 93   Ht 5' 4.5" (1.638 m)   Wt 138 lb 6.4 oz (62.8 kg)   SpO2 95%   BMI 23.39 kg/m     Wt Readings from Last  3 Encounters:  03/04/22 138 lb 6.4 oz (62.8 kg)  03/02/22 138 lb 6.4 oz (62.8 kg)  02/18/22 139 lb (63 kg)    Gen: no distress Neck: No JVD Cardiac: No Rubs or Gallops, no murmur, RRR +2 radial pulses Respiratory: Clear to auscultation bilaterally, normal effort, normal  respiratory rate GI: Soft, nontender, non-distended  MS: No edema; moves all extremities Integument: Skin feels warm Neuro:  At time of evaluation, alert and oriented to person/place/time/situation  Psych: Normal affect, patient feels well   ASSESSMENT:    1. SVT (supraventricular tachycardia)   2. SOB (shortness of breath)   3. Diabetes mellitus with coincident hypertension ([place])   4. Aortic atherosclerosis (HCC)     PLAN:    SVT -I would like to treat her wilth diltiazem 120 mg XL - she would like to repeat 1 week no live zio patch, reasonable - will get echo  HTN with DM Controlled on current therapy  Hyperlipidemia (mixed) Aortic Atherosclerosis Cirrhosis -LDL goal less than 70 - patient not amenable for therapy to achieve this goal  APP in 3-4 months   Medication Adjustments/Labs and Tests Ordered: Current medicines are reviewed at length with the patient today.  Concerns regarding medicines are outlined above.  Orders Placed This Encounter  Procedures   LONG TERM MONITOR (3-14 DAYS)   ECHOCARDIOGRAM COMPLETE   No orders of the defined types were placed in this encounter.   Patient Instructions  Medication Instructions:  Your physician recommends that you continue on your current medications as directed. Please refer to the Current Medication list given to you today.  *If you need a refill on your cardiac medications before your next appointment, please call your pharmacy*   Lab Work: NONE If you have labs (blood work) drawn today and your tests are completely normal, you will receive your results only by: [place] (if you have MyChart) OR A paper copy in the  mail If you  have any lab test that is abnormal or we need to change your treatment, we will call you to review the results.   Testing/Procedures: Your physician has requested that you have an echocardiogram. Echocardiography is a painless test that uses sound waves to create images of your heart. It provides your doctor with information about the size and shape of your heart and how well your heart's chambers and valves are working. This procedure takes approximately one hour. There are no restrictions for this procedure.  Your physician has requested that you wear a 7 day heart monitor.    Follow-Up: At [facility], you and your health needs are our priority.  As part of our continuing mission to provide you with exceptional heart care, we have created designated Provider Care Teams.  These Care Teams include your primary Cardiologist (physician) and Advanced Practice Providers (APPs -  Physician Assistants and Nurse Practitioners) who all work together to provide you with the care you need, when you need it.  We recommend signing up for the patient portal called "MyChart".  Sign up information is provided on this After Visit Summary.  MyChart is used to connect with patients for Virtual Visits (Telemedicine).  Patients are able to view lab/test results, encounter notes, upcoming appointments, etc.  Non-urgent messages can be sent to your provider as well.   To learn more about what you can do with MyChart, go to [email or web address].    Your next appointment:   3-4 month(s)  The format for your next appointment:   In Person  Provider:   Werner Lean, MD     Other Instructions ZIO XT- Long Term Monitor Instructions  Your physician has requested you wear a ZIO patch monitor for 7 days.  This is a single patch monitor. [employer] supplies one patch monitor per enrollment. Additional stickers are not available. Please do not apply patch if you will be having a Nuclear Stress Test,   Echocardiogram, Cardiac CT, MRI, or Chest Xray during the period you would be wearing the  monitor. The patch cannot be worn during these tests. You cannot remove and re-apply the  ZIO XT patch monitor.  Your ZIO patch monitor will be mailed 3 day USPS to your address on file. It may take 3-5 days  to receive your monitor after you have been enrolled.  Once you have received your monitor, please review the enclosed instructions. Your monitor  has already been registered assigning a specific monitor serial # to you.  Billing and Patient Assistance Program Information  We have supplied [employer] with any of your insurance information on file for billing purposes. [employer] offers a sliding scale Patient Assistance Program for patients that do not have  insurance, or whose insurance does not completely cover the cost of the ZIO monitor.  You must apply for the Patient Assistance Program to qualify for this discounted rate.  To apply, please call [employer] at [phone], select option 4, select option 2, ask to apply for  Patient Assistance Program. Theodore Demark will ask your household income, and how many people  are in your household. They will quote your out-of-pocket cost based on that information.  [employer] will also be able to set up a 50-month interest-free payment plan if needed.  Applying the monitor   Shave hair from upper left chest.  Hold abrader disc by orange tab. Rub abrader in 40 strokes over the upper left chest as  indicated in your monitor instructions.  Clean area with 4 enclosed alcohol pads. Let dry.  Apply patch as indicated in monitor instructions. Patch will be placed under collarbone on left  side of chest with arrow pointing upward.  Rub patch adhesive wings for 2 minutes. Remove white label marked "1". Remove the white  label marked "2". Rub patch adhesive wings for 2 additional minutes.  While looking in a mirror, press and release button in center of patch. A small  green light will  flash 3-4 times. This will be your only indicator that the monitor has been turned on.  Do not shower for the first 24 hours. You may shower after the first 24 hours.  Press the button if you feel a symptom. You will hear a small click. Record Date, Time and  Symptom in the Patient Logbook.  When you are ready to remove the patch, follow instructions on the last 2 pages of Patient  Logbook. Stick patch monitor onto the last page of Patient Logbook.  Place Patient Logbook in the blue and white box. Use locking tab on box and tape box closed  securely. The blue and white box has prepaid postage on it. Please place it in the mailbox as  soon as possible. Your physician should have your test results approximately 7 days after the  monitor has been mailed back to [facility].  Call [employer] at [phone] if you have questions regarding  your ZIO XT patch monitor. Call them immediately if you see an orange light blinking on your  monitor.  If your monitor falls off in less than 4 days, contact our Monitor department at [phone].  If your monitor becomes loose or falls off after 4 days call [employer] at [phone] for  suggestions on securing your monitor   Important Information About Sugar         Signed, Werner Lean, MD  03/04/2022 6:24 PM    [place]

## 2022-03-04 NOTE — Progress Notes (Unsigned)
Enrolled for [employer] to mail a ZIO XT long term holter monitor to the patients address on file.  

## 2022-03-04 NOTE — Patient Instructions (Addendum)
Medication Instructions:  Your physician recommends that you continue on your current medications as directed. Please refer to the Current Medication list given to you today.  *If you need a refill on your cardiac medications before your next appointment, please call your pharmacy*   Lab Work: NONE If you have labs (blood work) drawn today and your tests are completely normal, you will receive your results only by: [place] (if you have MyChart) OR A paper copy in the mail If you have any lab test that is abnormal or we need to change your treatment, we will call you to review the results.   Testing/Procedures: Your physician has requested that you have an echocardiogram. Echocardiography is a painless test that uses sound waves to create images of your heart. It provides your doctor with information about the size and shape of your heart and how well your heart's chambers and valves are working. This procedure takes approximately one hour. There are no restrictions for this procedure.  Your physician has requested that you wear a 7 day heart monitor.    Follow-Up: At [facility], you and your health needs are our priority.  As part of our continuing mission to provide you with exceptional heart care, we have created designated Provider Care Teams.  These Care Teams include your primary Cardiologist (physician) and Advanced Practice Providers (APPs -  Physician Assistants and Nurse Practitioners) who all work together to provide you with the care you need, when you need it.  We recommend signing up for the patient portal called "MyChart".  Sign up information is provided on this After Visit Summary.  MyChart is used to connect with patients for Virtual Visits (Telemedicine).  Patients are able to view lab/test results, encounter notes, upcoming appointments, etc.  Non-urgent messages can be sent to your provider as well.   To learn more about what you can do with MyChart, go  to [email or web address].    Your next appointment:   3-4 month(s)  The format for your next appointment:   In Person  Provider:   Werner Lean, MD     Other Instructions ZIO XT- Long Term Monitor Instructions  Your physician has requested you wear a ZIO patch monitor for 7 days.  This is a single patch monitor. [employer] supplies one patch monitor per enrollment. Additional stickers are not available. Please do not apply patch if you will be having a Nuclear Stress Test,  Echocardiogram, Cardiac CT, MRI, or Chest Xray during the period you would be wearing the  monitor. The patch cannot be worn during these tests. You cannot remove and re-apply the  ZIO XT patch monitor.  Your ZIO patch monitor will be mailed 3 day USPS to your address on file. It may take 3-5 days  to receive your monitor after you have been enrolled.  Once you have received your monitor, please review the enclosed instructions. Your monitor  has already been registered assigning a specific monitor serial # to you.  Billing and Patient Assistance Program Information  We have supplied [employer] with any of your insurance information on file for billing purposes. [employer] offers a sliding scale Patient Assistance Program for patients that do not have  insurance, or whose insurance does not completely cover the cost of the ZIO monitor.  You must apply for the Patient Assistance Program to qualify for this discounted rate.  To apply, please call [employer] at [phone], select option 4, select option 2, ask to apply for  Patient Assistance Program. Theodore Demark will ask your household income, and how many people  are in your household. They will quote your out-of-pocket cost based on that information.  [employer] will also be able to set up a 38-month interest-free payment plan if needed.  Applying the monitor   Shave hair from upper left chest.  Hold abrader disc by orange tab. Rub abrader in 40 strokes over the  upper left chest as  indicated in your monitor instructions.  Clean area with 4 enclosed alcohol pads. Let dry.  Apply patch as indicated in monitor instructions. Patch will be placed under collarbone on left  side of chest with arrow pointing upward.  Rub patch adhesive wings for 2 minutes. Remove white label marked "1". Remove the white  label marked "2". Rub patch adhesive wings for 2 additional minutes.  While looking in a mirror, press and release button in center of patch. A small green light will  flash 3-4 times. This will be your only indicator that the monitor has been turned on.  Do not shower for the first 24 hours. You may shower after the first 24 hours.  Press the button if you feel a symptom. You will hear a small click. Record Date, Time and  Symptom in the Patient Logbook.  When you are ready to remove the patch, follow instructions on the last 2 pages of Patient  Logbook. Stick patch monitor onto the last page of Patient Logbook.  Place Patient Logbook in the blue and white box. Use locking tab on box and tape box closed  securely. The blue and white box has prepaid postage on it. Please place it in the mailbox as  soon as possible. Your physician should have your test results approximately 7 days after the  monitor has been [facility]  Call [place] [phone] you have questions regarding  your ZIO XT patch monitor. Call them immediately if you see an orange light blinking on your  monitor.  If your monitor falls off in less than 4 days, contact our Monitor department at [phone]  If your monitor becomes loose or falls off after 4 days call [employer] at [phone]  suggestions on securing your monitor   Important Information About Sugar

## 2022-03-05 ENCOUNTER — Ambulatory Visit: Payer: Medicare Other

## 2022-03-08 DIAGNOSIS — R0602 Shortness of breath: Secondary | ICD-10-CM | POA: Diagnosis not present

## 2022-03-08 DIAGNOSIS — I471 Supraventricular tachycardia, unspecified: Secondary | ICD-10-CM

## 2022-03-24 DIAGNOSIS — I471 Supraventricular tachycardia, unspecified: Secondary | ICD-10-CM | POA: Diagnosis not present

## 2022-03-24 DIAGNOSIS — R0602 Shortness of breath: Secondary | ICD-10-CM | POA: Diagnosis not present

## 2022-03-26 ENCOUNTER — Ambulatory Visit (HOSPITAL_COMMUNITY): Payer: Medicare Other | Attending: Internal Medicine

## 2022-03-26 DIAGNOSIS — R0602 Shortness of breath: Secondary | ICD-10-CM

## 2022-03-26 DIAGNOSIS — I471 Supraventricular tachycardia, unspecified: Secondary | ICD-10-CM

## 2022-03-26 LAB — ECHOCARDIOGRAM COMPLETE
Area-P 1/2: 3.72 cm2
S' Lateral: 2.3 cm

## 2022-04-01 ENCOUNTER — Ambulatory Visit
Admission: RE | Admit: 2022-04-01 | Discharge: 2022-04-01 | Disposition: A | Discharge status: Home / Self Care | Payer: Medicare Other | Source: Ambulatory Visit | Attending: Pulmonary Disease | Admitting: Pulmonary Disease

## 2022-04-01 ENCOUNTER — Other Ambulatory Visit: Payer: Medicare Other

## 2022-04-01 DIAGNOSIS — J84112 Idiopathic pulmonary fibrosis: Secondary | ICD-10-CM | POA: Diagnosis not present

## 2022-04-01 DIAGNOSIS — J479 Bronchiectasis, uncomplicated: Secondary | ICD-10-CM | POA: Diagnosis not present

## 2022-04-01 DIAGNOSIS — J8410 Pulmonary fibrosis, unspecified: Secondary | ICD-10-CM

## 2022-04-01 DIAGNOSIS — I700 Atherosclerosis of aorta: Secondary | ICD-10-CM | POA: Diagnosis not present

## 2022-04-01 DIAGNOSIS — K766 Portal hypertension: Secondary | ICD-10-CM | POA: Diagnosis not present

## 2022-04-08 ENCOUNTER — Ambulatory Visit (INDEPENDENT_AMBULATORY_CARE_PROVIDER_SITE_OTHER): Payer: Medicare Other | Admitting: Pulmonary Disease

## 2022-04-08 ENCOUNTER — Encounter: Payer: Self-pay | Admitting: Pulmonary Disease

## 2022-04-08 VITALS — BP 126/70 | HR 84 | Temp 98.2°F | Ht 64.0 in | Wt 137.2 lb

## 2022-04-08 DIAGNOSIS — J8410 Pulmonary fibrosis, unspecified: Secondary | ICD-10-CM

## 2022-04-08 DIAGNOSIS — Z5181 Encounter for therapeutic drug level monitoring: Secondary | ICD-10-CM | POA: Diagnosis not present

## 2022-04-08 NOTE — Progress Notes (Signed)
Heather Townsend    [id number]    06-14-1945  Primary Care Physician:Hunter, Brayton Mars, MD  Referring Physician: Marin Olp, MD [place],  [place] [address]  Chief complaint:   Follow up for lung fibrosis.  HPI: Heather Townsend is a 76 y.o. with history of Nash cirrhosis, GERD, hiatal hernia. She had an MRI of the abdomen as a part of follow-up for cirrhosis. It showed fibrosis at lung bases. She has been referred here for further follow-up. She has some mild dyspnea on exertion but no other symptoms confined to the chest. She denies any cough, wheezing, sputum production, hemoptysis. She does not have any symptoms suggestive of connective tissue, autoimmune disease. She does have history of unspecified lung fibrosis in her mother and aunt. She does not have any exposures at work to asbestos or other inhalants.  Evaluated by Dr. Kathlene November on November 2020 for elevated antibodies with no evidence of autoimmune disease Received rheumatology clinic note from Dr. Kathlene November, [facility] dated 04/11/2019. Repeat labs including CK, aldolase, CRP negative, sed rate 40.   No concern for active connective tissue disease.  Discussed at multidisciplinary ILD conference in Jan 2021 with diagnosis of likely IPF  Interim History:  She is here for review of CT scan States that she has mild dyspnea on exertion which has worsened over the years.  Outpatient Encounter Medications as of 04/08/2022  Medication Sig   aspirin 81 MG chewable tablet Chew 81 mg by mouth daily as needed for mild pain.   Cyanocobalamin 1000 MCG/ML KIT Inject 1,000 mg as directed. Pt does self injections twice a month.   empagliflozin (JARDIANCE) 10 MG TABS tablet Take 1 tablet (10 mg total) by mouth daily before breakfast.   esomeprazole (NEXIUM) 40 MG packet Take 40 mg by mouth 3 (three) times a week.   Multiple Vitamin (MULTIVITAMIN ADULT PO) Take by mouth. "One a day"   No  facility-administered encounter medications on file as of 04/08/2022.   Physical Exam: Blood pressure 126/70, pulse 84, temperature 98.2 F (36.8 C), temperature source Oral, height _0  (1.626 m), weight 137 lb 3.2 oz (62.2 kg), SpO2 93 %. Gen:      No acute distress HEENT:  EOMI, sclera anicteric Neck:     No masses; no thyromegaly Lungs:    Clear to auscultation bilaterally; normal respiratory effort CV:         Regular rate and rhythm; no murmurs Abd:      + bowel sounds; soft, non-tender; no palpable masses, no distension Ext:    No edema; adequate peripheral perfusion Skin:      Warm and dry; no rash Neuro: alert and oriented x 3 Psych: normal mood and affect   Data Reviewed: CT abdomen 12/04/14- Reticulation at bases, unchanged since 2011 CT abdomen 06/02/11- Reticulation at bases. Images reviewed. 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 NSIP fibrosis High res CT 10/01/16- ILD, fibrosis, unchanged.  High res CT 10/20/17-mild progression pulmonary fibrosis, possible honeycombing High-res CT 04/04/2019-mild progression of pulmonary fibrosis in UIP pattern. CT chest 10/26/2019-stable pulmonary fibrosis High-resolution CT chest 04/01/2022-pulmonary pattern of fibrosis in UIP pattern which is likely progressive, cirrhosis with portal hypertension. I have reviewed all images personally.   Serologies 09/05/15 CRP 0.4, sedimentation rate 43 Aldolase 11.2 P-ANCA 1:40 dsDNA- 6 Negative- Jo-1, centromere, RNP, RO, LA RA, CCP, Scl 70   PFTs 09/29/15 FVC 2.45 (80%), FEV1 2.18 (93%),  F/F 89, TLC 79%, DLCO 53% Mild restriction, moderately severe diffusion defect.  6-minute walk test 04/11/2019-510 m, exertional O2 sat 93%  Assessment:  Idiopathic pulmonary fibrosis Although there is minimal progression on recent CT compared to 2019 there is clearly worsening compared to her earliest high-res CT in 2017.  The pattern is UIP suggestive of idiopathic pulmonary  fibrosis.  She does have a strong family history of pulmonary fibrosis in her mother and aunt. She does not have any exposure history or symptoms suggestive of connective tissue, autoimmune disease. Serologies are negative except for intermediate elevation in double-stranded DNA and mild elevation in ANCA of unclear significance.    She had declined lung biopsy in the past but at this point I do not think we need to proceed with that as the pattern is now is UIP.    Discussed antifibrotics but patient had been reluctant to try these in the past due to concern for liver damage in the setting of Nash cirrhosis.  Now with worsening symptoms and progression on CT scan she is willing to try medication We discussed different therapies available and have decided to start paperwork for Esbriet.  Need to follow hepatic panel for close monitoring of liver disease given history of cirrhosis. We discussed PFTs but she would like to avoid these  Health maintenance Does not want to get vaccinations.  Plan/Recommendations: - Start paperwork for Lavone Neri MD [facility] 04/08/2022, 4:16 PM  CC: Marin Olp, MD

## 2022-04-08 NOTE — Patient Instructions (Signed)
Your CT scan shows mild progression of scarring in the lung  Based on our discussion today we will start you on a medication called Esbriet to slow this down.  We will need to monitor your liver tests closely while you are on this medication  The alternate medication that we discussed today is called Ofev.  This can cause diarrhea as a side effect  You can look up these medications to get better educated on them  Follow-up in clinic in 3 months.

## 2022-04-09 ENCOUNTER — Telehealth: Payer: Self-pay | Admitting: Pharmacist

## 2022-04-09 NOTE — Telephone Encounter (Signed)
Please start ESBRIET BIV (try for brand name first due to PAP requirement)  Dose with 234m tabs: Take 1 tab three times daily for 7 days, then 2 tabs three times daily for 7 days, then 3 tabs three times daily thereafter.  Dx: IPF  [employer] paperwork placed in PAP pending info folder pendig PA  DKnox Saliva PharmD, MPH, BCPS, CPP Clinical Pharmacist (Rheumatology and Pulmonology)

## 2022-04-10 NOTE — Telephone Encounter (Signed)
Submitted a Prior Authorization request to [facility] for ESBRIET via CoverMyMeds. Will update once we receive a response.  Key: Ihor Dow, PharmD, MPH, BCPS, CPP Clinical Pharmacist (Rheumatology and Pulmonology)

## 2022-04-10 NOTE — Telephone Encounter (Signed)
Received a fax regarding Prior Authorization from [place] for ESBRIET tablet. Authorization has been DENIED because formulary-preferred alternatives are: ESBRIET CAPSULE, PIRFENIDONE tablets, pirfenidone capsule, Trikafta tablets.  Submitted a Prior Authorization request to [facility] for ESBRIET CAPSULES via CoverMyMeds. Will update once we receive a response.  Key: Rushie Goltz, PharmD, MPH, BCPS, CPP Clinical Pharmacist (Rheumatology and Pulmonology)

## 2022-04-15 ENCOUNTER — Other Ambulatory Visit (HOSPITAL_COMMUNITY): Payer: Self-pay

## 2022-04-15 NOTE — Telephone Encounter (Signed)
Submitted Patient Assistance Application to [employer] for ESBRIET along with provider portion, PA and income documents. Will update patient when we receive a response.  Fax# [phone] Phone# [phone] 

## 2022-04-15 NOTE — Telephone Encounter (Signed)
Received notification from [facility] regarding a prior authorization for ESBRIET. Authorization has been APPROVED from 06/01/2021 to 04/10/2023. Approval letter sent to scan center.  Per test claim, copay for 30 days supply is $3,104.85. Cost for generic is [phone]  Patient can fill through [place]: [phone]   Authorization # [id number]  Will finalize paperwork for PAP submission.

## 2022-04-20 NOTE — Telephone Encounter (Signed)
Received a fax from [place] regarding an approval for [place] patient assistance from 04/17/2022 until patient no longer qualifies due to discontinuation of therapy, changes to patient's current financial status or health insurance and/or patient no longer meets the program eligibility requirements. ATC pt but had to LVM. Provided update and explained next steps to take if they have not already been contacted by either [employer] or [employer]. Phone numbers to both provided. Requested that pt reach out to [place] here at the clinic with any additional questions or concerns.  Relevant Documents have been sent to scan center.  [employer] Phone#: [phone] option 5 [employer] Phone#: [phone]

## 2022-04-22 ENCOUNTER — Telehealth: Payer: Self-pay | Admitting: Pulmonary Disease

## 2022-04-22 NOTE — Telephone Encounter (Signed)
Spoke to pharmacy to clarify quantity of Esbriet 267 MG. Nothing further needed at this time.

## 2022-04-24 NOTE — Telephone Encounter (Signed)
See encounter from 11/09. Will close this encounter.

## 2022-04-27 ENCOUNTER — Telehealth: Payer: Self-pay | Admitting: Pulmonary Disease

## 2022-04-27 NOTE — Telephone Encounter (Signed)
Attempted to call pt but the line rang 4 times and then received a recording stating that the call could not be completed as dialed and to try to call again later.  Tried to call back but received the same message. Will try to call pt back later.

## 2022-04-28 MED ORDER — PAXLOVID (300/100) 20 X 150 MG & 10 X 100MG PO TBPK: 3.0000 | ORAL_TABLET | Freq: Two times a day (BID) | ORAL | 0 refills | Status: DC

## 2022-04-28 NOTE — Telephone Encounter (Signed)
Spoke with pt who states she currently has a sore throat, cough, increased SOB, headache, generalized body aches, fever, chills, ear ache and nausea. These symptoms started on Saturday night (3 days ago). Pt states she did test positive for Covid yesterday. Pt states she is not vaccinated to Covid, flu nor RSV. Pharmacy is [facility].  Pt is using OTC for nausea. Pt instructed if symptoms get worse she needs to seek evaluation at [facility] or ED. [name] please advise.

## 2022-04-28 NOTE — Telephone Encounter (Signed)
Symptoms started Saturday night Has Headache, congestion and low grade fevers  Using OTC tylenol and decongestants I sent in a prescription for paxlovid to her [facility]. Nothing further needed at this point

## 2022-04-29 NOTE — Telephone Encounter (Signed)
Pt currently being treated for COVID with Paxolvid. Will f/u next week.

## 2022-05-04 ENCOUNTER — Encounter: Payer: Self-pay | Admitting: *Deleted

## 2022-05-04 ENCOUNTER — Telehealth: Payer: Self-pay | Admitting: *Deleted

## 2022-05-04 NOTE — Patient Instructions (Signed)
Visit Information  Thank you for taking time to visit with me today. Please don't hesitate to contact me if I can be of assistance to you.   Following are the goals we discussed today:   Goals Addressed               This Visit's Progress     COMPLETED: No needs (pt-stated)        Care Coordination Interventions: Reviewed medications with patient and discussed adherence with all medications with no needed refills Reviewed scheduled/upcoming provider appointments including sufficient transportation source Assessed social determinant of health barriers          Please call the care guide team at [phone] if you need to cancel or reschedule your appointment.   If you are experiencing a Mental Health or [place] or need someone to talk to, please call the Suicide and Crisis Lifeline: 988 call the [place] National Suicide Prevention Lifeline: 3052880861 or TTY: 740 146 8201 TTY (816)851-5504) to talk to a trained counselor call 1-800-273-TALK (toll free, 24 hour hotline)  The patient verbalized understanding of instructions, educational materials, and care plan provided today and DECLINED offer to receive copy of patient instructions, educational materials, and care plan.   No further follow up required: No needs  Raina Mina, RN Care Management Coordinator [place] Office [phone]

## 2022-05-04 NOTE — Patient Outreach (Signed)
  Care Coordination   Initial Visit Note   05/04/2022 Name: IXCHEL DUCK MRN: [id number] DOB: 1945-06-23  NAVEYAH IACOVELLI is a 76 y.o. year old female who sees Yong Channel, Brayton Mars, MD for primary care. I spoke with  Smitty Cords by phone today.  What matters to the patients health and wellness today?  No needs    Goals Addressed               This Visit's Progress     COMPLETED: No needs (pt-stated)        Care Coordination Interventions: Reviewed medications with patient and discussed adherence with all medications with no needed refills Reviewed scheduled/upcoming provider appointments including sufficient transportation source Assessed social determinant of health barriers          SDOH assessments and interventions completed:  Yes  SDOH Interventions Today    Flowsheet Row Most Recent Value  SDOH Interventions   Food Insecurity Interventions Intervention Not Indicated  Housing Interventions Intervention Not Indicated  Transportation Interventions Intervention Not Indicated  Utilities Interventions Intervention Not Indicated        Care Coordination Interventions:  Yes, provided   Follow up plan: No further intervention required.   Encounter Outcome:  Pt. Visit Completed   Raina Mina, RN Care Management Coordinator [place] Office [phone]

## 2022-05-11 ENCOUNTER — Telehealth: Payer: Self-pay | Admitting: Pharmacist

## 2022-05-11 NOTE — Telephone Encounter (Signed)
ATC patient to provide Esbriet counseling. Unable to leave VM. Will try again later this week  Knox Saliva, PharmD, MPH, BCPS, CPP Clinical Pharmacist (Rheumatology and Pulmonology)

## 2022-05-11 NOTE — Telephone Encounter (Signed)
Will f/u in separate telephone encounter  Knox Saliva, PharmD, MPH, BCPS, CPP Clinical Pharmacist (Rheumatology and Pulmonology)

## 2022-07-03 ENCOUNTER — Ambulatory Visit: Payer: Medicare Other | Admitting: Family Medicine

## 2022-07-27 ENCOUNTER — Telehealth: Payer: Self-pay | Admitting: Family Medicine

## 2022-07-27 NOTE — Telephone Encounter (Signed)
Patient requests RX for Metformin be sent to:   [place],  - [address] Phone: [phone]  Fax: [phone]      Last OV 03/02/22

## 2022-07-28 ENCOUNTER — Encounter: Payer: Self-pay | Admitting: Internal Medicine

## 2022-07-28 ENCOUNTER — Ambulatory Visit: Payer: Medicare Other | Attending: Internal Medicine | Admitting: Internal Medicine

## 2022-07-28 VITALS — BP 140/82 | HR 100 | Ht 64.0 in | Wt 140.0 lb

## 2022-07-28 DIAGNOSIS — E119 Type 2 diabetes mellitus without complications: Secondary | ICD-10-CM

## 2022-07-28 DIAGNOSIS — I471 Supraventricular tachycardia, unspecified: Secondary | ICD-10-CM | POA: Insufficient documentation

## 2022-07-28 DIAGNOSIS — I700 Atherosclerosis of aorta: Secondary | ICD-10-CM | POA: Diagnosis not present

## 2022-07-28 DIAGNOSIS — E782 Mixed hyperlipidemia: Secondary | ICD-10-CM | POA: Diagnosis not present

## 2022-07-28 DIAGNOSIS — I10 Essential (primary) hypertension: Secondary | ICD-10-CM | POA: Diagnosis not present

## 2022-07-28 MED ORDER — DILTIAZEM HCL 30 MG PO TABS: 30.0000 mg | ORAL_TABLET | Freq: Four times a day (QID) | ORAL | 11 refills | Status: DC | PRN

## 2022-07-28 NOTE — Telephone Encounter (Signed)
Called and spoke with pt and she states she wants to switch to metformin after looking up the ingredients of Jardiance and realizing that Jardiance contains metformin. She no longer wants to take Jardiance.

## 2022-07-28 NOTE — Telephone Encounter (Signed)
Called and spoke with pt and she prefers metformin still.

## 2022-07-28 NOTE — Progress Notes (Signed)
Cardiology Office Note:    Date:  07/28/2022   ID:  Heather Townsend, Heather Townsend 09/29/1945, MRN [id number]  PCP:  Marin Olp, MD  [facility] Cardiologist:  Werner Lean, MD  [facility] Electrophysiologist:  None   CC: SVT f/u  History of Present Illness:    Heather Townsend is a 77 y.o. female with a hx of DM with HLD. Aortic atherosclerosis, NASH with Cirrhosis ([facility]), ILD, MVP who presented for evaluation 06/27/20. 2022: Stress test normal, asymptomatic SVT.  Lost to follow up. 2023: Repeat heart monitor showed SVT.  She is having frequent and worsening palpitations. No syncope. Patient notes that she is doing ok when they don't occur. She has new leg swelling with small erythema, she brings 5 pictures from yesterday to show. There are no interval hospital/ED visit.     Past Medical History:  Diagnosis Date   Allergy    Cirrhosis ([place])    Diabetes mellitus without complication ([place])    Diverticulosis    DIVERTICULOSIS, COLON 01/03/2007   Fatty infiltration of liver    GERD (gastroesophageal reflux disease)    Hepatitis, chronic active ([place])    NASH   Hiatal hernia    HIATAL HERNIA 12/07/2006   Hyperlipidemia    Interstitial lung disease (HCC)    Low back pain    Tuberculosis    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    Past Surgical History:  Procedure Laterality Date   APPENDECTOMY     EYE SURGERY     macular hole   ganglion cysts     left hand   HAND SURGERY  1996   SHOULDER SURGERY  2006   rt shoulder   TONSILLECTOMY     TUBAL LIGATION     UTERINE SUSPENSION      Current Medications: Current Meds  Medication Sig   aspirin 81 MG chewable tablet Chew 81 mg by mouth daily as needed for mild pain.   Cyanocobalamin 1000 MCG/ML KIT Inject 1,000 mg as directed. Pt does self injections twice a month.   diltiazem (CARDIZEM) 30 MG tablet Take 1 tablet (30 mg total) by mouth every 6 (six) hours as needed (palpitations).   empagliflozin  (JARDIANCE) 10 MG TABS tablet Take 1 tablet (10 mg total) by mouth daily before breakfast.   esomeprazole (NEXIUM) 40 MG packet Take 40 mg by mouth 3 (three) times a week.   metFORMIN (GLUCOPHAGE) 500 MG tablet Take 500 mg by mouth 2 (two) times daily with a meal.   Multiple Vitamin (MULTIVITAMIN ADULT PO) Take by mouth. "One a day"   [DISCONTINUED] nirmatrelvir & ritonavir (PAXLOVID, 300/100,) 20 x 150 MG & 10 x '100MG'$  TBPK Take 3 tablets by mouth 2 (two) times daily. 300 mg nirmatrelvir (two 150 mg tablets) and 100 mg ritonavir (one 100 mg tablet) with all 3 tablets taken together by mouth twice daily for 5 days.     Allergies:   Sulfasalazine, Celecoxib, Codeine, Ibuprofen, Meloxicam, Nsaids, Rofecoxib, Sulfa antibiotics, and Sulfonamide derivatives   Social History   Socioeconomic History   Marital status: Divorced    Spouse name: Not on file   Number of children: Not on file   Years of education: Not on file   Highest education level: Not on file  Occupational History   Occupation: Glass blower/designer   Occupation: care giver  Tobacco Use   Smoking status: Former    Packs/day: 0.75    Years: 15.00  Total pack years: 11.25    Types: Cigarettes    Quit date: 06/01/1986    Years since quitting: 36.1   Smokeless tobacco: Never  Vaping Use   Vaping Use: Never used  Substance and Sexual Activity   Alcohol use: Not Currently    Alcohol/week: 0.0 standard drinks of alcohol    Comment: rare   Drug use: No   Sexual activity: Not Currently  Other Topics Concern   Not on file  Social History Narrative   Divorced. Long term boyfriend passed 2018.  Lives alone. 2 children (son in [place], daughter in [place]). 1 granddaughter in [place].       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    Social Determinants of Health   Financial Resource  Strain: Low Risk  (07/21/2021)   Overall Financial Resource Strain (CARDIA)    Difficulty of Paying Living Expenses: Not hard at all  Food Insecurity: No Food Insecurity (05/04/2022)   Hunger Vital Sign    Worried About Running Out of Food in the Last Year: Never true    Ran Out of Food in the Last Year: Never true  Transportation Needs: No Transportation Needs (05/04/2022)   PRAPARE - Hydrologist (Medical): No    Lack of Transportation (Non-Medical): No  Physical Activity: Inactive (07/21/2021)   Exercise Vital Sign    Days of Exercise per Week: 0 days    Minutes of Exercise per Session: 0 min  Stress: No Stress Concern Present (07/21/2021)   [place]    Feeling of Stress : Not at all  Social Connections: Socially Isolated (07/21/2021)   Social Connection and Isolation Panel [NHANES]    Frequency of Communication with Friends and Family: More than three times a week    Frequency of Social Gatherings with Friends and Family: More than three times a week    Attends Religious Services: Never    Marine scientist or Organizations: No    Attends Archivist Meetings: Never    Marital Status: Divorced      Family History: The patient's family history includes Cancer in her maternal aunt; Colon cancer in her paternal uncle; Diabetes in her mother; Esophageal cancer in her maternal aunt and maternal uncle; Heart disease in her mother; Pancreatic cancer in her paternal uncle; Stomach cancer in her maternal aunt, maternal uncle, and paternal uncle. There is no history of Rectal cancer.  History of coronary artery disease notable for no members. History of heart failure notable for mother. History of arrhythmia notable for atrial fibrillation in mother and sisters.  ROS:   Please see the history of present illness.     All other systems reviewed and are negative.  EKGs/Labs/Other  Studies Reviewed:    The following studies were reviewed today:  EKG:   06/27/20: SR rate 77 with nonspecific TWI  .Cardiac Studies & Procedures     STRESS TESTS  MYOCARDIAL PERFUSION IMAGING 07/11/2020  Narrative  The left ventricular ejection fraction is hyperdynamic (>65%).  Nuclear stress EF: 74%.  There was no ST segment deviation noted during stress.  Blood pressure demonstrated a normal response to exercise.  The study is normal.  This is a low risk study.  Normal exercise nuclear stress test with no evidence for prior  infarct or ischemia. Normal LVEF. Normal exercise tolerance. Normal BP response to exertion.   ECHOCARDIOGRAM  ECHOCARDIOGRAM COMPLETE 03/26/2022  Narrative ECHOCARDIOGRAM REPORT    Patient Name:   TIEKA MANTOVANI Date of Exam: 03/26/2022 Medical Rec #:  [id number]      Height:       64.5 in Accession #:    [id number]     Weight:       138.4 lb Date of Birth:  1945/12/06      BSA:          1.682 m Patient Age:    30 years       BP:           140/70 mmHg Patient Gender: F              HR:           79 bpm. Exam Location:  [address]  Procedure: 2D Echo, 3D Echo, Cardiac Doppler, Color Doppler and Strain Analysis  Indications:    R06.02 Shortness of breath  History:        Patient has no prior history of Echocardiogram examinations. Risk Factors:Diabetes and Dyslipidemia.  Sonographer:    Cresenciano Lick RDCS Referring Phys: [id number] [facility] A Donovon Micheletti  IMPRESSIONS   1. Left ventricular ejection fraction, by estimation, is 60 to 65%. The left ventricle has normal function. The left ventricle has no regional wall motion abnormalities. Left ventricular diastolic parameters are consistent with Grade I diastolic dysfunction (impaired relaxation). The average left ventricular global longitudinal strain is -26.1 %. The global longitudinal strain is normal. 2. Right ventricular systolic function is normal. The right ventricular size  is normal. There is normal pulmonary artery systolic pressure. The estimated right ventricular systolic pressure is Q000111Q mmHg. 3. The mitral valve is normal in structure. No evidence of mitral valve regurgitation. No evidence of mitral stenosis. 4. The aortic valve is tricuspid. Aortic valve regurgitation is not visualized. No aortic stenosis is present. 5. The inferior vena cava is normal in size with greater than 50% respiratory variability, suggesting right atrial pressure of 3 mmHg.  FINDINGS Left Ventricle: Left ventricular ejection fraction, by estimation, is 60 to 65%. The left ventricle has normal function. The left ventricle has no regional wall motion abnormalities. The average left ventricular global longitudinal strain is -26.1 %. The global longitudinal strain is normal. The left ventricular internal cavity size was normal in size. There is no left ventricular hypertrophy. Left ventricular diastolic parameters are consistent with Grade I diastolic dysfunction (impaired relaxation).  Right Ventricle: The right ventricular size is normal. No increase in right ventricular wall thickness. Right ventricular systolic function is normal. There is normal pulmonary artery systolic pressure. The tricuspid regurgitant velocity is 1.82 m/s, and with an assumed right atrial pressure of 3 mmHg, the estimated right ventricular systolic pressure is Q000111Q mmHg.  Left Atrium: Left atrial size was normal in size.  Right Atrium: Right atrial size was normal in size.  Pericardium: There is no evidence of pericardial effusion.  Mitral Valve: The mitral valve is normal in structure. There is mild calcification of the mitral valve leaflet(s). Mild mitral annular calcification. No evidence of mitral valve regurgitation. No evidence of mitral valve stenosis.  Tricuspid Valve: The tricuspid valve is normal in structure. Tricuspid valve regurgitation is trivial.  Aortic Valve: The aortic valve is tricuspid.  Aortic valve regurgitation is not visualized. No aortic stenosis is present.  Pulmonic Valve: The pulmonic valve was normal in structure.  Pulmonic valve regurgitation is not visualized.  Aorta: The aortic root is normal in size and structure.  Venous: The inferior vena cava is normal in size with greater than 50% respiratory variability, suggesting right atrial pressure of 3 mmHg.  IAS/Shunts: No atrial level shunt detected by color flow Doppler.   LEFT VENTRICLE PLAX 2D LVIDd:         4.00 cm   Diastology LVIDs:         2.30 cm   LV e' medial:    5.44 cm/s LV PW:         0.60 cm   LV E/e' medial:  12.2 LV IVS:        0.60 cm   LV e' lateral:   10.80 cm/s LVOT diam:     1.50 cm   LV E/e' lateral: 6.2 LV SV:         38 LV SV Index:   22        2D Longitudinal Strain LVOT Area:     1.77 cm  2D Strain GLS (A2C):   -25.6 % 2D Strain GLS (A3C):   -26.0 % 2D Strain GLS (A4C):   -26.5 % 2D Strain GLS Avg:     -26.1 %  3D Volume EF: 3D EF:        55 % LV EDV:       104 ml LV ESV:       47 ml LV SV:        57 ml  RIGHT VENTRICLE             IVC RV Basal diam:  2.90 cm     IVC diam: 1.20 cm RV S prime:     17.10 cm/s TAPSE (M-mode): 2.2 cm  LEFT ATRIUM             Index        RIGHT ATRIUM          Index LA diam:        4.60 cm 2.73 cm/m   RA Area:     7.97 cm LA Vol (A2C):   29.3 ml 17.42 ml/m  RA Volume:   13.50 ml 8.03 ml/m LA Vol (A4C):   31.0 ml 18.43 ml/m LA Biplane Vol: 31.3 ml 18.61 ml/m AORTIC VALVE LVOT Vmax:   110.67 cm/s LVOT Vmean:  74.867 cm/s LVOT VTI:    0.214 m  AORTA Ao Root diam: 2.80 cm Ao Asc diam:  3.20 cm  MITRAL VALVE               TRICUSPID VALVE MV Area (PHT): 3.72 cm    TR Peak grad:   13.2 mmHg MV Decel Time: 204 msec    TR Vmax:        182.00 cm/s MV E velocity: 66.45 cm/s MV A velocity: 93.65 cm/s  SHUNTS MV E/A ratio:  0.71        Systemic VTI:  0.21 m Systemic Diam: 1.50 cm  Dalton McleanMD Electronically signed by Franki Monte Signature Date/Time: 03/26/2022/5:12:36 PM    Final    MONITORS  LONG TERM MONITOR (3-14 DAYS) 04/02/2022  Narrative   Patient had a minimum heart rate of 55 bpm, maximum heart rate of 193 bpm, and average heart rate of 78 bpm.   Predominant underlying rhythm was sinus rhythm.   Small runs of SVT lasting 6 beats at longest with a max rate of 193 bpm at fastest.   Isolated  PACs were rare (<1.0%).   Isolated PVCs were rare (<1.0%).   Triggered and diary events associated with largely sinus rhythm or PACs (no symptomatic SVT).  Asymptomatic SVT.             Recent Labs: 02/18/2022: NT-Pro BNP 59 03/02/2022: ALT 59; BUN 14; Creatinine, Ser 0.67; Hemoglobin 15.8; Platelets 109.0; Potassium 3.8; Sodium 136; TSH 4.24  Recent Lipid Panel    Component Value Date/Time   CHOL 168 03/02/2022 1604   TRIG 105.0 03/02/2022 1604   HDL 54.10 03/02/2022 1604   CHOLHDL 3 03/02/2022 1604   VLDL 21.0 03/02/2022 1604   LDLCALC 93 03/02/2022 1604   LDLDIRECT 148.3 11/28/2012 0905     Physical Exam:    VS:  BP (!) 140/82   Pulse 100   Ht '5\' 4"'$  (1.626 m)   Wt 140 lb (63.5 kg)   SpO2 93%   BMI 24.03 kg/m     Wt Readings from Last 3 Encounters:  07/28/22 140 lb (63.5 kg)  04/08/22 137 lb 3.2 oz (62.2 kg)  03/04/22 138 lb 6.4 oz (62.8 kg)    Gen: no distress Neck: No JVD Cardiac: No Rubs or Gallops, no murmur, RRR +2 radial pulses Respiratory: Clear to auscultation bilaterally, normal effort, normal  respiratory rate GI: Soft, nontender, non-distended  MS: No edema; moves all extremities Integument: Small redness and swelling over left malleolus Neuro:  At time of evaluation, alert and oriented to person/place/time/situation  Psych: Normal affect, patient feels well  ASSESSMENT:    1. SVT (supraventricular tachycardia)   2. Diabetes mellitus with coincident hypertension ([place])   3. Mixed hyperlipidemia   4. Aortic atherosclerosis (HCC)     PLAN:    SVT - she has  has symptomatic SVT for years and has not wanted to start medication - we have discussed AV nodal therapy for two years - after long discussion she is amenable to try diltiazem 30 mg PO PRN for palpitations - she is not amenable to trying standing AV nodal therapy - she wishes to see an electrophysiologist for this issues  HTN with DM - patient not amenable for therapy to achieve goal  Hyperlipidemia (mixed) Aortic Atherosclerosis Cirrhosis -LDL goal less than 70 - patient not amenable for therapy to achieve this goal  I have over PRN f/u She would like to see me in one year   Medication Adjustments/Labs and Tests Ordered: Current medicines are reviewed at length with the patient today.  Concerns regarding medicines are outlined above.  Orders Placed This Encounter  Procedures   Ambulatory referral to Cardiac Electrophysiology   Meds ordered this encounter  Medications   diltiazem (CARDIZEM) 30 MG tablet    Sig: Take 1 tablet (30 mg total) by mouth every 6 (six) hours as needed (palpitations).    Dispense:  120 tablet    Refill:  11    Patient Instructions  Medication Instructions:  Your physician has recommended you make the following change in your medication:   1) START diltiazem (Cardizem) '30mg'$  every 6 hours as needed for palpitations  *If you need a refill on your cardiac medications before your next appointment, please call your pharmacy*  Lab Work: None  Testing/Procedures: None  Follow-Up: At [facility], you and your health needs are our priority.  As part of our continuing mission to provide you with exceptional heart care, we have created designated Provider Care Teams.  These Care Teams include your primary Cardiologist (physician) and Advanced  Practice Providers (APPs -  Physician Assistants and Nurse Practitioners) who all work together to provide you with the care you need, when you need it.  Your next appointment:   1 year(s)  Provider:    Werner Lean, MD   Other Instructions Your physician has referred you to our electrophysiologist Dr. Doralee Albino. A scheduler will call you to schedule an appointment for a consult.   Signed, Werner Lean, MD  07/28/2022 4:50 PM    [facility]

## 2022-07-28 NOTE — Patient Instructions (Signed)
Medication Instructions:  Your physician has recommended you make the following change in your medication:   1) START diltiazem (Cardizem) '30mg'$  every 6 hours as needed for palpitations  *If you need a refill on your cardiac medications before your next appointment, please call your pharmacy*  Lab Work: None  Testing/Procedures: None  Follow-Up: At [facility], you and your health needs are our priority.  As part of our continuing mission to provide you with exceptional heart care, we have created designated Provider Care Teams.  These Care Teams include your primary Cardiologist (physician) and Advanced Practice Providers (APPs -  Physician Assistants and Nurse Practitioners) who all work together to provide you with the care you need, when you need it.  Your next appointment:   1 year(s)  Provider:   Werner Lean, MD   Other Instructions Your physician has referred you to our electrophysiologist Dr. Doralee Albino. A scheduler will call you to schedule an appointment for a consult.

## 2022-07-28 NOTE — Telephone Encounter (Signed)
Team please send in metformin 500 mg twice daily #180 with 3 refills -Verbally tell her for the first week to just take 1 pill a day with breakfast and after we can add a pill a day with dinner-update me after a few weeks and I can consider increasing further if tolerating

## 2022-07-28 NOTE — Telephone Encounter (Signed)
There are combo pills with jardiance that contain metformin- she is not on a combo pill- she is on jardiance alone(no metformin in it) - please inform her of this and wish her happy early birthday! If she still wants alternate medicine I am open to looking at this again

## 2022-07-29 ENCOUNTER — Other Ambulatory Visit: Payer: Self-pay

## 2022-07-29 MED ORDER — METFORMIN HCL 500 MG PO TABS: 500.0000 mg | ORAL_TABLET | Freq: Two times a day (BID) | ORAL | 3 refills | Status: DC

## 2022-07-29 NOTE — Telephone Encounter (Signed)
Sent in metformin, pt didn't answer call so will call back later with instructions

## 2022-08-06 ENCOUNTER — Encounter: Payer: Self-pay | Admitting: Cardiovascular Disease

## 2022-08-30 ENCOUNTER — Emergency Department (HOSPITAL_COMMUNITY): Payer: Medicare Other

## 2022-08-30 ENCOUNTER — Emergency Department (HOSPITAL_COMMUNITY)
Admission: EM | Admit: 2022-08-30 | Discharge: 2022-08-30 | Disposition: A | Discharge status: Home / Self Care | Payer: Medicare Other | Attending: Emergency Medicine | Admitting: Emergency Medicine

## 2022-08-30 ENCOUNTER — Encounter (HOSPITAL_COMMUNITY): Payer: Self-pay

## 2022-08-30 DIAGNOSIS — E119 Type 2 diabetes mellitus without complications: Secondary | ICD-10-CM | POA: Insufficient documentation

## 2022-08-30 DIAGNOSIS — N200 Calculus of kidney: Secondary | ICD-10-CM | POA: Diagnosis not present

## 2022-08-30 DIAGNOSIS — R109 Unspecified abdominal pain: Secondary | ICD-10-CM | POA: Diagnosis present

## 2022-08-30 DIAGNOSIS — M79605 Pain in left leg: Secondary | ICD-10-CM | POA: Diagnosis not present

## 2022-08-30 DIAGNOSIS — Z7982 Long term (current) use of aspirin: Secondary | ICD-10-CM | POA: Diagnosis not present

## 2022-08-30 DIAGNOSIS — N132 Hydronephrosis with renal and ureteral calculous obstruction: Secondary | ICD-10-CM | POA: Diagnosis not present

## 2022-08-30 DIAGNOSIS — Z7984 Long term (current) use of oral hypoglycemic drugs: Secondary | ICD-10-CM | POA: Insufficient documentation

## 2022-08-30 DIAGNOSIS — D696 Thrombocytopenia, unspecified: Secondary | ICD-10-CM | POA: Diagnosis not present

## 2022-08-30 DIAGNOSIS — K5730 Diverticulosis of large intestine without perforation or abscess without bleeding: Secondary | ICD-10-CM | POA: Diagnosis not present

## 2022-08-30 DIAGNOSIS — R7401 Elevation of levels of liver transaminase levels: Secondary | ICD-10-CM | POA: Insufficient documentation

## 2022-08-30 DIAGNOSIS — N1330 Unspecified hydronephrosis: Secondary | ICD-10-CM

## 2022-08-30 LAB — COMPREHENSIVE METABOLIC PANEL
ALT: 58 U/L — ABNORMAL HIGH (ref 0–44)
AST: 74 U/L — ABNORMAL HIGH (ref 15–41)
Albumin: 3.6 g/dL (ref 3.5–5.0)
Alkaline Phosphatase: 106 U/L (ref 38–126)
Anion gap: 10 (ref 5–15)
BUN: 20 mg/dL (ref 8–23)
CO2: 24 mmol/L (ref 22–32)
Calcium: 9 mg/dL (ref 8.9–10.3)
Chloride: 103 mmol/L (ref 98–111)
Creatinine, Ser: 0.85 mg/dL (ref 0.44–1.00)
GFR, Estimated: >60 mL/min (ref >60)
Glucose, Bld: 233 mg/dL — ABNORMAL HIGH (ref 70–99)
Potassium: 3.8 mmol/L (ref 3.5–5.1)
Sodium: 137 mmol/L (ref 135–145)
Total Bilirubin: 0.6 mg/dL (ref 0.3–1.2)
Total Protein: 7.1 g/dL (ref 6.5–8.1)

## 2022-08-30 LAB — CBC
HCT: 41.9 % (ref 36.0–46.0)
Hemoglobin: 14.2 g/dL (ref 12.0–15.0)
MCH: 31.3 pg (ref 26.0–34.0)
MCHC: 33.9 g/dL (ref 30.0–36.0)
MCV: 92.5 fL (ref 80.0–100.0)
Platelets: 97 10*3/uL — ABNORMAL LOW (ref 150–400)
RBC: 4.53 MIL/uL (ref 3.87–5.11)
RDW: 13 % (ref 11.5–15.5)
WBC: 5.3 10*3/uL (ref 4.0–10.5)
nRBC: 0 % (ref 0.0–0.2)

## 2022-08-30 LAB — URINALYSIS, W/ REFLEX TO CULTURE (INFECTION SUSPECTED)
Bilirubin Urine: NEGATIVE
Glucose, UA: >=500 mg/dL — AB
Ketones, ur: 20 mg/dL — AB
Leukocytes,Ua: NEGATIVE
Nitrite: NEGATIVE
Protein, ur: NEGATIVE mg/dL
Specific Gravity, Urine: 1.018 (ref 1.005–1.030)
pH: 6 (ref 5.0–8.0)

## 2022-08-30 MED ORDER — HYDROMORPHONE HCL 1 MG/ML IJ SOLN
0.5000 mg | Freq: Once | INTRAMUSCULAR | Status: AC
  Administered 2022-08-30: 0.5 mg via INTRAVENOUS
  Filled 2022-08-30: qty 1

## 2022-08-30 MED ORDER — ONDANSETRON 4 MG PO TBDP: 4.0000 mg | ORAL_TABLET | Freq: Three times a day (TID) | ORAL | 0 refills | Status: DC | PRN

## 2022-08-30 MED ORDER — HYDROCODONE-ACETAMINOPHEN 5-325 MG PO TABS
1.0000 | ORAL_TABLET | Freq: Once | ORAL | Status: AC
  Administered 2022-08-30: 1 via ORAL
  Filled 2022-08-30: qty 1

## 2022-08-30 MED ORDER — HYDROCODONE-ACETAMINOPHEN 5-325 MG PO TABS: 1.0000 | ORAL_TABLET | Freq: Four times a day (QID) | ORAL | 0 refills | Status: DC | PRN

## 2022-08-30 MED ORDER — FENTANYL CITRATE PF 50 MCG/ML IJ SOSY
50.0000 ug | PREFILLED_SYRINGE | Freq: Once | INTRAMUSCULAR | Status: AC
  Administered 2022-08-30: 50 ug via INTRAVENOUS
  Filled 2022-08-30: qty 1

## 2022-08-30 NOTE — Discharge Instructions (Addendum)
At this time there does not appear to be the presence of an emergent medical condition, however there is always the potential for conditions to change. Please read and follow the below instructions.  Please return to the Emergency Department immediately for any new or worsening symptoms. Please be sure to follow up with your Primary Care Provider within one week regarding your visit today; please call their office to schedule an appointment even if you are feeling better for a follow-up visit. Please call the urologist today to schedule a follow-up appointment regarding your kidney stone.  You may follow-up with the on-call physician Dr. Alyson Ingles here in [place] or you may call his partner Dr. Felipa Eth at their new office in [facility]. You may take the medication Norco (Hydrocodone/Acetaminophen) as prescribed to help with severe pain.  This medication will make you drowsy so do not drive, drink alcohol, take other sedating medications or perform any dangerous activities such as driving after taking Norco. Norco contains Tylenol (acetaminophen) so do not take any other Tylenol-containing products with Norco. Your CT scan today also showed other stones within both of your kidneys, please discuss this with your urologist at your follow-up appointment.  It also showed some stones within your gallbladder please discuss this with your primary care provider.  Your CT scan also showed some diverticulosis of your colon along with cirrhosis and interstitial pneumonia and aortic atherosclerosis.  Please discuss all of these findings with your primary care provider at your follow-up appointment. You may use the medication Zofran as prescribed to help with nausea. Your platelet count was low today.  Please have it rechecked by your primary care provider at your follow-up appointment.   Please read the additional information packets attached to your discharge summary.  Go to the nearest Emergency Department  immediately if: You have fever or chills You get very bad pain. You get new pain in your belly. You faint. You cannot pee. You have any new/concerning or worsening of symptoms  Do not take your medicine if  develop an itchy rash, swelling in your mouth or lips, or difficulty breathing; call 911 and seek immediate emergency medical attention if this occurs.  You may review your lab tests and imaging results in their entirety on your MyChart account.  Please discuss all results of fully with your primary care provider and other specialist at your follow-up visit.  Note: Portions of this text may have been transcribed using voice recognition software. Every effort was made to ensure accuracy; however, inadvertent computerized transcription errors may still be present.

## 2022-08-30 NOTE — ED Triage Notes (Signed)
Pt arrived POV from home for possible kidney stone to her left flank, pain woke her from sleep. Denies blood in urine, denies urinary symptoms. Pt reports some nausea, hx of kidney stones.

## 2022-08-30 NOTE — ED Provider Notes (Signed)
[facility] Provider Note   CSN: [id number] Arrival date & time: 08/30/22  [id number]     History  Chief Complaint  Patient presents with   Flank Pain    Heather Townsend is a 77 y.o. female history includes diverticulosis, cirrhosis, diabetes, GERD, hyperlipidemia, interstitial lung disease, TB.  Patient presents to the ER for evaluation of left flank pain onset 3 AM this morning, no clear inciting event describes pain as an intermittent sharp pain severe, no clear aggravating or alleviating factors.  She reports history of kidney stone around 2 years ago which felt very similar.  She denies any dysuria, fever, chills, chest pain/shortness of breath, cough, numbness or any additional concerns. HPI     Home Medications Prior to Admission medications   Medication Sig Start Date End Date Taking? Authorizing Provider  HYDROcodone-acetaminophen (NORCO/VICODIN) 5-325 MG tablet Take 1 tablet by mouth every 6 (six) hours as needed for severe pain. 08/30/22  Yes Nuala Alpha A, PA-C  ondansetron (ZOFRAN-ODT) 4 MG disintegrating tablet Take 1 tablet (4 mg total) by mouth every 8 (eight) hours as needed for nausea or vomiting. 08/30/22  Yes Nuala Alpha A, PA-C  aspirin 81 MG chewable tablet Chew 81 mg by mouth daily as needed for mild pain.    [provider]  Cyanocobalamin 1000 MCG/ML KIT Inject 1,000 mg as directed. Pt does self injections twice a month.    [provider]  diltiazem (CARDIZEM) 30 MG tablet Take 1 tablet (30 mg total) by mouth every 6 (six) hours as needed (palpitations). 07/28/22   Werner Lean, MD  empagliflozin (JARDIANCE) 10 MG TABS tablet Take 1 tablet (10 mg total) by mouth daily before breakfast. 03/04/22   Marin Olp, MD  esomeprazole (NEXIUM) 40 MG packet Take 40 mg by mouth 3 (three) times a week.    [provider]  metFORMIN (GLUCOPHAGE) 500 MG tablet Take 500 mg by mouth 2  (two) times daily with a meal.    [provider]  metFORMIN (GLUCOPHAGE) 500 MG tablet Take 1 tablet (500 mg total) by mouth 2 (two) times daily with a meal. 07/29/22   Marin Olp, MD  Multiple Vitamin (MULTIVITAMIN ADULT PO) Take by mouth. "One a day"    [provider]      Allergies    Sulfasalazine, Celecoxib, Codeine, Ibuprofen, Meloxicam, Nsaids, Rofecoxib, Sulfa antibiotics, and Sulfonamide derivatives    Review of Systems   Review of Systems Ten systems are reviewed and are negative for acute change except as noted in the HPI  Physical Exam Updated Vital Signs BP 121/89   Pulse 74   Temp 97.8 F (36.6 C) (Oral)   Resp 16   Ht 5\' 4"  (1.626 m)   Wt 62.6 kg   SpO2 93%   BMI 23.69 kg/m  Physical Exam Constitutional:      General: She is not in acute distress.    Appearance: Normal appearance. She is well-developed. She is not ill-appearing or diaphoretic.  HENT:     Head: Normocephalic and atraumatic.  Eyes:     General: Vision grossly intact. Gaze aligned appropriately.     Pupils: Pupils are equal, round, and reactive to light.  Neck:     Trachea: Trachea and phonation normal.  Pulmonary:     Effort: Pulmonary effort is normal. No respiratory distress.  Abdominal:     General: There is no distension.     Palpations:  Abdomen is soft.     Tenderness: There is no abdominal tenderness. There is no guarding or rebound.  Musculoskeletal:        General: Normal range of motion.     Cervical back: Normal range of motion.     Comments: No midline spinal tenderness palpation.  No crepitus step-off or deformity spine.  Skin:    General: Skin is warm and dry.  Neurological:     Mental Status: She is alert.     GCS: GCS eye subscore is 4. GCS verbal subscore is 5. GCS motor subscore is 6.     Comments: Speech is clear and goal oriented, follows commands Major Cranial nerves without deficit, no facial droop Moves extremities without ataxia,  coordination intact  Psychiatric:        Behavior: Behavior normal.     ED Results / Procedures / Treatments   Labs (all labs ordered are listed, but only abnormal results are displayed) Labs Reviewed  CBC - Abnormal; Notable for the following components:      Result Value   Platelets 97 (*)    All other components within normal limits  COMPREHENSIVE METABOLIC PANEL - Abnormal; Notable for the following components:   Glucose, Bld 233 (*)    AST 74 (*)    ALT 58 (*)    All other components within normal limits  URINALYSIS, W/ REFLEX TO CULTURE (INFECTION SUSPECTED) - Abnormal; Notable for the following components:   Glucose, UA >=500 (*)    Hgb urine dipstick LARGE (*)    Ketones, ur 20 (*)    Bacteria, UA RARE (*)    All other components within normal limits  URINE CULTURE    EKG None  Radiology CT Renal Stone Study  Result Date: 08/30/2022 CLINICAL DATA:  77 year old female with history of left lower quadrant abdominal pain radiating into the left flank. Nausea. Hematuria. EXAM: CT ABDOMEN AND PELVIS WITHOUT CONTRAST TECHNIQUE: Multidetector CT imaging of the abdomen and pelvis was performed following the standard protocol without IV contrast. RADIATION DOSE REDUCTION: This exam was performed according to the departmental dose-optimization program which includes automated exposure control, adjustment of the mA and/or kV according to patient size and/or use of iterative reconstruction technique. COMPARISON:  CT of the abdomen and pelvis 08/12/2020. FINDINGS: Lower chest: Extensive fibrotic changes are again noted throughout the lung bases, including areas of bronchiectasis and honeycombing, similar to the prior study. Calcifications of the mitral annulus. Atherosclerotic calcifications in the descending thoracic aorta. Hepatobiliary: Liver has a shrunken appearance and nodular contour, indicative of advanced cirrhosis. No discrete cystic or solid hepatic lesions are confidently  identified on today's noncontrast CT examination. 9 mm calcified gallstone lying dependently in the gallbladder. Gallbladder is moderately distended. No definite gallbladder wall thickening, pericholecystic fluid or surrounding inflammatory changes are noted on today's noncontrast CT examination. Pancreas: No definite pancreatic mass or peripancreatic fluid collections or inflammatory changes are noted on today's noncontrast CT examination. Spleen: Unremarkable. Adrenals/Urinary Tract: Multiple tiny nonobstructive calculi measuring 1-3 mm are noted in both renal collecting systems (left-greater-than-right). In addition, in the proximal third of the left ureter shortly beyond the left ureteropelvic junction (coronal image 52 of series 4) there is a 6 mm obstructive calculus with mild proximal left hydroureteronephrosis. No additional calculi are noted elsewhere along the course of either ureter or in the lumen of the urinary bladder. No right hydroureteronephrosis. Unenhanced appearance of the adrenal glands is unremarkable. Urinary bladder is nearly completely decompressed, but  otherwise unremarkable in appearance. Stomach/Bowel: The unenhanced appearance of the stomach is unremarkable. Large duodenal diverticulum extending off the medial aspect of the second portion of the duodenum, without surrounding inflammatory changes to indicate an associated diverticulitis. No pathologic dilatation of small bowel or colon. Numerous colonic diverticula are noted, particularly in the sigmoid colon, without surrounding inflammatory changes to indicate an acute colonic diverticulitis. The appendix is not confidently identified and may be surgically absent. Regardless, there are no inflammatory changes noted adjacent to the cecum to suggest the presence of an acute appendicitis at this time. Vascular/Lymphatic: Aortic atherosclerosis. Retroaortic left renal vein (normal anatomical variant) incidentally noted. No lymphadenopathy  noted in the abdomen or pelvis. Reproductive: Unenhanced appearance of the uterus and ovaries is unremarkable. Other: No significant volume of ascites.  No pneumoperitoneum. Musculoskeletal: Chronic compression fractures of superior endplates of L1 and L2, most severe at L2 where there is 20% loss of anterior vertebral body height. There are no aggressive appearing lytic or blastic lesions noted in the visualized portions of the skeleton. IMPRESSION: 1. 6 mm obstructive calculus in the proximal third of the left ureter with mild proximal left hydroureteronephrosis. 2. Multiple additional tiny 1-3 mm nonobstructive calculi in the collecting systems of both kidneys. 3. Cholelithiasis without evidence of acute cholecystitis. 4. Colonic diverticulosis without evidence of acute diverticulitis. 5. Advanced cirrhosis. 6. 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 7. Aortic atherosclerosis. Electronically Signed   By: Vinnie Langton M.D.   On: 08/30/2022 07:08    Procedures Procedures    Medications Ordered in ED Medications  fentaNYL (SUBLIMAZE) injection 50 mcg (50 mcg Intravenous Given 08/30/22 0725)  HYDROmorphone (DILAUDID) injection 0.5 mg (0.5 mg Intravenous Given 08/30/22 0906)  HYDROcodone-acetaminophen (NORCO/VICODIN) 5-325 MG per tablet 1 tablet (1 tablet Oral Given 08/30/22 1203)    ED Course/ Medical Decision Making/ A&P Clinical Course as of 08/30/22 1246  Sun Aug 30, 2022  0909 Unable to locate urinalysis.  RN to resend. [BM]  52 Consulted with urologist Dr. Alyson Ingles who recommends outpatient follow-up with urology.  Recommends patient like to go to their [facility] office she can see Dr. Felipa Eth. [BM]    Clinical Course User Index [BM] Deliah Boston, PA-C                             Medical Decision Making 77 year old female presented for evaluation of left leg pain onset 3 AM this morning she is a history of kidney  stone disease and feels this is similar.  She reports some associated nausea.  No fevers, chills or dysuria.  She has no additional concerns.  On evaluation she is well-appearing and distress she is some mild left flank tenderness.  No midline spinal tenderness.  No neurologic complaint.  No history of trauma.  Overall she is well-appearing in no acute distress.  Basic labs along with CT imaging were ordered in triage.  Amount and/or Complexity of Data Reviewed Labs:     Details: CBC without leukocytosis to suggest factious process, n thrombocytopenia which appears similar to prior. CMP shows slight elevation of LFTs.  No emergent electrolyte derangement, AKI or gap. Urinalysis shows some large hemoglobin and greater than 500 glucose.  6-10 WBCs and rare bacteria.  No leukocytes or nitrites.  Low suspicion for UTI. Radiology:     Details: I personally reviewed and interpreted patient CT renal stone study.  I agree  with radiologist the patient has a left sided urolithiasis with hydronephrosis.  I do not appreciate any obvious free air. Discussion of management or test interpretation with external provider(s): Consult with urologist Dr. Alyson Ingles. Discussed case with attending physician Dr. Armandina Gemma.  Risk Prescription drug management. Risk Details: Patient reassessed resting calmly bed no acute distress, pain resolved following IV pain medication.  We are unable to give Flomax due to history of allergy.  NSAIDs avoided given age.  Patient will be provided with a course of Norco to help with pain along with a course of Zofran.  She is encouraged to follow-up with urology, per Dr. Alyson Ingles patient may be able to get in with Dr. Felipa Eth in the next few days at their new office in [facility].  Patient states understanding.  She is having someone come to pick her up from the ER.  I discussed narcotic precautions with the patient and she stated understanding.  PDMP reviewed, no prior narcotic prescriptions.   I provided patient with strict ER return precautions today and she stated understanding.  Incidental findings on CT imaging and labs were noted and patient to follow-up with PCP.     At this time there does not appear to be any evidence of an acute emergency medical condition and the patient appears stable for discharge with appropriate outpatient follow up. Diagnosis was discussed with patient who verbalizes understanding of care plan and is agreeable to discharge. I have discussed return precautions with patient who verbalizes understanding. Patient encouraged to follow-up with their PCP and urology. All questions answered.  Patient's case discussed with Dr. Armandina Gemma who agrees with plan to discharge with follow-up.   Note: Portions of this report may have been transcribed using voice recognition software. Every effort was made to ensure accuracy; however, inadvertent computerized transcription errors may still be present.         Final Clinical Impression(s) / ED Diagnoses Final diagnoses:  Left nephrolithiasis  Hydronephrosis of left kidney  Thrombocytopenia ([place])    Rx / DC Orders ED Discharge Orders          Ordered    HYDROcodone-acetaminophen (NORCO/VICODIN) 5-325 MG tablet  Every 6 hours PRN        08/30/22 1226    ondansetron (ZOFRAN-ODT) 4 MG disintegrating tablet  Every 8 hours PRN        08/30/22 1228              Gari Crown 08/30/22 1246    Regan Lemming, MD 08/30/22 1827

## 2022-08-31 DIAGNOSIS — K7460 Unspecified cirrhosis of liver: Secondary | ICD-10-CM | POA: Diagnosis not present

## 2022-08-31 DIAGNOSIS — N200 Calculus of kidney: Secondary | ICD-10-CM

## 2022-08-31 DIAGNOSIS — Z79899 Other long term (current) drug therapy: Secondary | ICD-10-CM | POA: Diagnosis not present

## 2022-08-31 DIAGNOSIS — E119 Type 2 diabetes mellitus without complications: Secondary | ICD-10-CM | POA: Diagnosis not present

## 2022-08-31 DIAGNOSIS — N201 Calculus of ureter: Secondary | ICD-10-CM | POA: Diagnosis not present

## 2022-08-31 DIAGNOSIS — Z7982 Long term (current) use of aspirin: Secondary | ICD-10-CM | POA: Diagnosis not present

## 2022-08-31 HISTORY — DX: Calculus of kidney: N20.0

## 2022-08-31 LAB — URINE CULTURE: Culture: NO GROWTH

## 2022-09-01 DIAGNOSIS — K7460 Unspecified cirrhosis of liver: Secondary | ICD-10-CM | POA: Diagnosis not present

## 2022-09-01 DIAGNOSIS — E119 Type 2 diabetes mellitus without complications: Secondary | ICD-10-CM | POA: Diagnosis not present

## 2022-09-01 DIAGNOSIS — Z7982 Long term (current) use of aspirin: Secondary | ICD-10-CM | POA: Diagnosis not present

## 2022-09-01 DIAGNOSIS — Z79899 Other long term (current) drug therapy: Secondary | ICD-10-CM | POA: Diagnosis not present

## 2022-09-01 DIAGNOSIS — N201 Calculus of ureter: Secondary | ICD-10-CM | POA: Diagnosis not present

## 2022-09-03 ENCOUNTER — Telehealth: Payer: Self-pay

## 2022-09-03 DIAGNOSIS — N201 Calculus of ureter: Secondary | ICD-10-CM | POA: Diagnosis not present

## 2022-09-03 DIAGNOSIS — Z79899 Other long term (current) drug therapy: Secondary | ICD-10-CM | POA: Diagnosis not present

## 2022-09-03 DIAGNOSIS — N200 Calculus of kidney: Secondary | ICD-10-CM | POA: Diagnosis not present

## 2022-09-03 DIAGNOSIS — Z7982 Long term (current) use of aspirin: Secondary | ICD-10-CM | POA: Diagnosis not present

## 2022-09-03 NOTE — Telephone Encounter (Signed)
     Patient  visit on 3/31  at [facility] you been able to follow up with your primary care physician? Yes   The patient was or was not able to obtain any needed medicine or equipment. Yes   Are there diet recommendations that you are having difficulty following? Na   Patient expresses understanding of discharge instructions and education provided has no other needs at this time.  Yes      [facility] [phone] [address], [place], [place] [address] Phone: [phone] Email: [email or web address]

## 2022-09-12 DIAGNOSIS — Z556 Problems related to health literacy: Secondary | ICD-10-CM | POA: Diagnosis not present

## 2022-09-12 DIAGNOSIS — Z87891 Personal history of nicotine dependence: Secondary | ICD-10-CM | POA: Diagnosis not present

## 2022-09-12 DIAGNOSIS — N390 Urinary tract infection, site not specified: Secondary | ICD-10-CM | POA: Diagnosis not present

## 2022-09-12 DIAGNOSIS — Z960 Presence of urogenital implants: Secondary | ICD-10-CM | POA: Diagnosis not present

## 2022-09-12 DIAGNOSIS — R310 Gross hematuria: Secondary | ICD-10-CM | POA: Diagnosis not present

## 2022-09-12 DIAGNOSIS — Z9889 Other specified postprocedural states: Secondary | ICD-10-CM | POA: Diagnosis not present

## 2022-09-17 ENCOUNTER — Ambulatory Visit (INDEPENDENT_AMBULATORY_CARE_PROVIDER_SITE_OTHER): Payer: Medicare Other | Admitting: Family Medicine

## 2022-09-17 ENCOUNTER — Encounter: Payer: Self-pay | Admitting: Family Medicine

## 2022-09-17 VITALS — BP 124/80 | HR 81 | Temp 97.6°F | Ht 64.0 in | Wt 136.8 lb

## 2022-09-17 DIAGNOSIS — Z87440 Personal history of urinary (tract) infections: Secondary | ICD-10-CM | POA: Diagnosis not present

## 2022-09-17 DIAGNOSIS — E1165 Type 2 diabetes mellitus with hyperglycemia: Secondary | ICD-10-CM | POA: Diagnosis not present

## 2022-09-17 DIAGNOSIS — E119 Type 2 diabetes mellitus without complications: Secondary | ICD-10-CM | POA: Diagnosis not present

## 2022-09-17 DIAGNOSIS — I8510 Secondary esophageal varices without bleeding: Secondary | ICD-10-CM

## 2022-09-17 DIAGNOSIS — J8410 Pulmonary fibrosis, unspecified: Secondary | ICD-10-CM | POA: Diagnosis not present

## 2022-09-17 DIAGNOSIS — Z7984 Long term (current) use of oral hypoglycemic drugs: Secondary | ICD-10-CM | POA: Diagnosis not present

## 2022-09-17 MED ORDER — METFORMIN HCL 500 MG PO TABS: 500.0000 mg | ORAL_TABLET | Freq: Two times a day (BID) | ORAL | 3 refills | Status: AC

## 2022-09-17 NOTE — Patient Instructions (Addendum)
Tetanus, Diphtheria, and Pertussis (Tdap) at pharmacy recommended  Team request electronic record for her diabetic eye exam  You likely need insulin- will check a1c and we will reach out  Please stop by lab before you go If you have mychart- we will send your results within 3 business days of [place] receiving them.  If you do not have mychart- we will call you about results within 5 business days of [place] receiving them.  *please also note that you will see labs on mychart as soon as they post. I will later go in and write notes on them- will say "notes from Dr. Durene Cal"   [facility] contact Please call to schedule visit and/or procedure Address: [address] [place], [place], [place] [address] Phone: [phone]   Recommended follow up: Return in about 14 weeks (around 12/24/2022) for followup or sooner if needed.Schedule b4 you leave. For diabetes follow up

## 2022-09-17 NOTE — Progress Notes (Signed)
Phone [phone] In person visit   Subjective:   Heather Townsend is a 77 y.o. year old very pleasant female patient who presents for/with See problem oriented charting Chief Complaint  Patient presents with   4 month follow-up   Left leg pain   Left side pain    Stent was put in left side, thinks it could be from that. Has kidney stones, seeing Nephrology for this.   Past Medical History-  Patient Active Problem List   Diagnosis Date Noted   Pulmonary fibrosis 02/14/2019    Priority: High   SOB (shortness of breath) 08/16/2015    Priority: High   Type 2 diabetes mellitus 03/08/2014    Priority: High   NASH (nonalcoholic steatohepatitis) 04/04/2013    Priority: High   Esophageal varices without bleeding 02/14/2019    Priority: Medium    Left thyroid nodule 11/06/2015    Priority: Medium    Cervical disc disease 04/16/2014    Priority: Medium    GERD 09/05/2007    Priority: Medium    Dyslipidemia 08/16/2007    Priority: Medium    Vitamin B12 deficiency 01/17/2018    Priority: Low   Osteopenia 03/07/2014    Priority: Low   Rash, skin 01/25/2013    Priority: Low   Renal calculus or stone 04/27/2011    Priority: Low   SVT (supraventricular tachycardia) 03/04/2022   Aortic atherosclerosis 03/04/2022   Chest pain of uncertain etiology 06/27/2020   Diabetes mellitus with coincident hypertension 06/27/2020   Hyperlipidemia 06/27/2020   Palpitations 06/27/2020   Cirrhosis of liver without ascites 06/27/2020   Uncontrolled type 2 diabetes mellitus with hyperglycemia 02/14/2019   Trigeminal neuralgia 02/14/2019    Medications- reviewed and updated Current Outpatient Medications  Medication Sig Dispense Refill   aspirin 81 MG chewable tablet Chew 81 mg by mouth daily as needed for mild pain.     cefdinir (OMNICEF) 300 MG capsule Take by mouth.     Cyanocobalamin 1000 MCG/ML KIT Inject 1,000 mg as directed. Pt does self injections twice a month.     diltiazem  (CARDIZEM) 30 MG tablet Take 1 tablet (30 mg total) by mouth every 6 (six) hours as needed (palpitations). 120 tablet 11   esomeprazole (NEXIUM) 40 MG packet Take 40 mg by mouth 3 (three) times a week.     Multiple Vitamin (MULTIVITAMIN ADULT PO) Take by mouth. "One a day"     POTASSIUM PO Take by mouth. Twice a week.     tamsulosin (FLOMAX) 0.4 MG CAPS capsule Take by mouth daily.     metFORMIN (GLUCOPHAGE) 500 MG tablet Take 1 tablet (500 mg total) by mouth 2 (two) times daily with a meal. 180 tablet 3   No current facility-administered medications for this visit.     Objective:  BP 124/80 (BP Location: Left Arm, Patient Position: Sitting)   Pulse 81   Temp 97.6 F (36.4 C) (Temporal)   Ht  (1.626 m)   Wt 136 lb 12.8 oz (62.1 kg)   SpO2 96%   BMI 23.48 kg/m  Gen: NAD, resting comfortably CV: RRR no murmurs rubs or gallops Lungs: CTAB no crackles, wheeze, rhonchi Ext: no edema Skin: warm, dry      Assessment and Plan   # Concern for UTI-patient with gross hematuria and had emergency department visit on 09/12/2022 with [facility]-she was started on cefdinir 300 mg twice daily for 7 days.  Thought gross hematuria related to UTI.  She was  instructed to follow-up with urology- scheduled for tomorro.  Urine culture from 09/12/2022 showed no growth -Emergency department within: 08/30/2022 she did have left-sided urolithiasis with hydronephrosis -Appears she had a cystoscopy on 08/31/2022 with left ureteral stent placement  (she reports lithotripsy) at [facility] reports will have removed tomorrow. Will see Dr. Cleatrice Burke tomorrow to likely have removed.   -has passed some small stones after lithotripsy  #Left leg pain- does get left flank pain but interestingly radiates into left leg- has been told in past neuropathy issues could be related to her back so it is possible the leg is exhibiting irritation of nerve root- could refer to sports medicine or orthopedic if not improving after  stent removed. Could refer to physical therapy - she declines for now  # Pulmonary fibrosis-follows with pulmonology Dr. Isaiah Serge and rheumatology -Declines preventative vaccinations -Encouraged follow-up with pulmonology-none scheduled- encouraged her to schedule  -Has some baseline shortness of breath which could be due to pulmonary fibrosis but we also referred her to cardiology-has visit on May 10 plan   # NASH S: Patient followed [facility] liver care.  Has lost weight in the past but likely related to poorly controlled diabetes.  Continues to have mild LFT elevations. -Last ultrasound appeared to be 11/10/2021 with [facility] A/P: Will update LFTs today and encouraged follow-up with [facility] liver care- has appointment June 24th. Thankfully last LFTs improved slightly -Known esophageal varices in the past in 2020 and I encouraged repeat visit with Dr. Russella Dar strongly today    # Diabetes S: Medication:Not taking metformin previously prescribed - was told they could not refill despite [place] placing 90 day supply for 500 mg twice daily on July 29 2022  We have prescribed Jardiance (she never took this) and she had declined endocrinology referral CBGs- not checking -trying to cut down on sweets and sugar sweetened beverages Lab Results  Component Value Date   HGBA1C 14.1 Repeated and verified X2. (H) 03/02/2022   HGBA1C 10.6 (H) 06/07/2020   HGBA1C 11.8 (H) 02/14/2019  A/P: likely very poorly controlled- we discussed metformin would likely only partially help- likely needs insulin or more medicine- she is not interested.    #hyperlipidemia S: Medication: None-patient firmly against statin but does agree to at least take aspirin Lab Results  Component Value Date   CHOL 168 03/02/2022   HDL 54.10 03/02/2022   LDLCALC 93 03/02/2022   LDLDIRECT 148.3 11/28/2012   TRIG 105.0 03/02/2022   CHOLHDL 3 03/02/2022   A/P: she declines statin- concerned  about liver in particular- will tolerate  slight elevatoin   # B12 deficiency S: Current treatment/medication (oral vs. IM): Self inject twice a month Lab Results  Component Value Date   VITAMINB12 734 03/02/2022  A/P: Adequately treated-continue current medications    Recommended follow up: Return in about 14 weeks (around 12/24/2022) for followup or sooner if needed.Schedule b4 you leave. Future Appointments  Date Time Provider Department Center  10/09/2022  3:30 PM Mealor, Roberts Gaudy, MD [id number]    Lab/Order associations:   ICD-10-CM   1. History of UTI  Z87.440 CANCELED: POCT Urinalysis Dipstick    2. Type 2 diabetes mellitus without complication, without long-term current use of insulin  E11.9 Hemoglobin A1c    Comprehensive metabolic panel    CBC w/Diff    3. 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 Uncontrolled type 2 diabetes mellitus with hyperglycemia Chronic E11.65  5. Pulmonary fibrosis Chronic J84.10       Meds ordered this encounter  Medications   metFORMIN (GLUCOPHAGE) 500 MG tablet    Sig: Take 1 tablet (500 mg total) by mouth 2 (two) times daily with a meal.    Dispense:  180 tablet    Refill:  3    Return precautions advised.  Tana Conch, MD

## 2022-09-18 DIAGNOSIS — N201 Calculus of ureter: Secondary | ICD-10-CM | POA: Diagnosis not present

## 2022-09-18 DIAGNOSIS — Z87442 Personal history of urinary calculi: Secondary | ICD-10-CM | POA: Diagnosis not present

## 2022-09-18 LAB — COMPREHENSIVE METABOLIC PANEL
ALT: 36 U/L — ABNORMAL HIGH (ref 0–35)
AST: 49 U/L — ABNORMAL HIGH (ref 0–37)
Albumin: 3.9 g/dL (ref 3.5–5.2)
Alkaline Phosphatase: 89 U/L (ref 39–117)
BUN: 12 mg/dL (ref 6–23)
CO2: 31 mEq/L (ref 19–32)
Calcium: 9.2 mg/dL (ref 8.4–10.5)
Chloride: 97 mEq/L (ref 96–112)
Creatinine, Ser: 0.75 mg/dL (ref 0.40–1.20)
GFR: 78.03 mL/min (ref >60.00)
Glucose, Bld: 188 mg/dL — ABNORMAL HIGH (ref 70–99)
Potassium: 3.6 mEq/L (ref 3.5–5.1)
Sodium: 135 mEq/L (ref 135–145)
Total Bilirubin: 0.7 mg/dL (ref 0.2–1.2)
Total Protein: 7.5 g/dL (ref 6.0–8.3)

## 2022-09-18 LAB — CBC WITH DIFFERENTIAL/PLATELET
Basophils Absolute: 0.1 10*3/uL (ref 0.0–0.1)
Basophils Relative: 1.2 % (ref 0.0–3.0)
Eosinophils Absolute: 0.2 10*3/uL (ref 0.0–0.7)
Eosinophils Relative: 3.9 % (ref 0.0–5.0)
HCT: 45.9 % (ref 36.0–46.0)
Hemoglobin: 15.5 g/dL — ABNORMAL HIGH (ref 12.0–15.0)
Lymphocytes Relative: 27.5 % (ref 12.0–46.0)
Lymphs Abs: 1.4 10*3/uL (ref 0.7–4.0)
MCHC: 33.8 g/dL (ref 30.0–36.0)
MCV: 93.7 fl (ref 78.0–100.0)
Monocytes Absolute: 0.4 10*3/uL (ref 0.1–1.0)
Monocytes Relative: 8.7 % (ref 3.0–12.0)
Neutro Abs: 3 10*3/uL (ref 1.4–7.7)
Neutrophils Relative %: 58.7 % (ref 43.0–77.0)
Platelets: 119 10*3/uL — ABNORMAL LOW (ref 150.0–400.0)
RBC: 4.9 Mil/uL (ref 3.87–5.11)
RDW: 14.1 % (ref 11.5–15.5)
WBC: 5.2 10*3/uL (ref 4.0–10.5)

## 2022-09-18 LAB — HEMOGLOBIN A1C: Hgb A1c MFr Bld: 9.8 % — ABNORMAL HIGH (ref 4.6–6.5)

## 2022-10-01 DIAGNOSIS — M79674 Pain in right toe(s): Secondary | ICD-10-CM | POA: Diagnosis not present

## 2022-10-02 DIAGNOSIS — I8500 Esophageal varices without bleeding: Secondary | ICD-10-CM | POA: Diagnosis not present

## 2022-10-02 DIAGNOSIS — D376 Neoplasm of uncertain behavior of liver, gallbladder and bile ducts: Secondary | ICD-10-CM | POA: Diagnosis not present

## 2022-10-02 DIAGNOSIS — N201 Calculus of ureter: Secondary | ICD-10-CM | POA: Diagnosis not present

## 2022-10-02 DIAGNOSIS — Z7982 Long term (current) use of aspirin: Secondary | ICD-10-CM | POA: Diagnosis not present

## 2022-10-02 DIAGNOSIS — Z886 Allergy status to analgesic agent status: Secondary | ICD-10-CM | POA: Diagnosis not present

## 2022-10-02 DIAGNOSIS — K219 Gastro-esophageal reflux disease without esophagitis: Secondary | ICD-10-CM | POA: Diagnosis not present

## 2022-10-02 DIAGNOSIS — Z79899 Other long term (current) drug therapy: Secondary | ICD-10-CM | POA: Diagnosis not present

## 2022-10-02 DIAGNOSIS — E119 Type 2 diabetes mellitus without complications: Secondary | ICD-10-CM | POA: Diagnosis not present

## 2022-10-02 DIAGNOSIS — Z87891 Personal history of nicotine dependence: Secondary | ICD-10-CM | POA: Diagnosis not present

## 2022-10-02 DIAGNOSIS — K7469 Other cirrhosis of liver: Secondary | ICD-10-CM | POA: Diagnosis not present

## 2022-10-02 DIAGNOSIS — Z885 Allergy status to narcotic agent status: Secondary | ICD-10-CM | POA: Diagnosis not present

## 2022-10-02 DIAGNOSIS — Z882 Allergy status to sulfonamides status: Secondary | ICD-10-CM | POA: Diagnosis not present

## 2022-10-02 DIAGNOSIS — K7581 Nonalcoholic steatohepatitis (NASH): Secondary | ICD-10-CM | POA: Diagnosis not present

## 2022-10-06 DIAGNOSIS — R8290 Unspecified abnormal findings in urine: Secondary | ICD-10-CM | POA: Diagnosis not present

## 2022-10-06 DIAGNOSIS — N200 Calculus of kidney: Secondary | ICD-10-CM | POA: Diagnosis not present

## 2022-10-06 DIAGNOSIS — Z960 Presence of urogenital implants: Secondary | ICD-10-CM | POA: Diagnosis not present

## 2022-10-09 ENCOUNTER — Ambulatory Visit: Payer: Medicare Other | Attending: Cardiovascular Disease | Admitting: Cardiovascular Disease

## 2022-10-09 ENCOUNTER — Encounter: Payer: Self-pay | Admitting: Cardiovascular Disease

## 2022-10-09 VITALS — BP 128/80 | HR 88 | Ht 64.5 in | Wt 135.0 lb

## 2022-10-09 DIAGNOSIS — I471 Supraventricular tachycardia, unspecified: Secondary | ICD-10-CM | POA: Diagnosis not present

## 2022-10-09 NOTE — Patient Instructions (Signed)
Medication Instructions:  Your physician recommends that you continue on your current medications as directed. Please refer to the Current Medication list given to you today. *If you need a refill on your cardiac medications before your next appointment, please call your pharmacy*   Follow-Up: At [facility], you and your health needs are our priority.  As part of our continuing mission to provide you with exceptional heart care, we have created designated Provider Care Teams.  These Care Teams include your primary Cardiologist (physician) and Advanced Practice Providers (APPs -  Physician Assistants and Nurse Practitioners) who all work together to provide you with the care you need, when you need it.  We recommend signing up for the patient portal called "MyChart".  Sign up information is provided on this After Visit Summary.  MyChart is used to connect with patients for Virtual Visits (Telemedicine).  Patients are able to view lab/test results, encounter notes, upcoming appointments, etc.  Non-urgent messages can be sent to your provider as well.   To learn more about what you can do with MyChart, go to [email or web address].    Your next appointment:   1 year(s)  Provider:   Augustus Mealor, MD  

## 2022-10-09 NOTE — Progress Notes (Signed)
Electrophysiology Office Note:    Date:  10/09/2022   ID:  Monchel, Atalla [birth year], MRN [id number]  PCP:  Shelva Majestic, MD   [facility] Providers Cardiologist:  Christell Constant, MD     Referring MD: Riley Lam A*   History of Present Illness:    Heather Townsend is a 77 y.o. female with a hx listed below, significant for aortic atherosclerosis, Nash cirrhosis, interstitial lung disease, SVT referred for arrhythmia management.  She has had episodes of rapid palpitations over the years.  The seem to come and go and frequency and severity.  They were at their worst after her first visit with Dr. Izora Ribas.  The episodes typically last just a few minutes and resolved with Valsalva.  During this Period they did not.  She was prescribed diltiazem, but since her last visit with Dr. Izora Ribas, she has not needed to take it.  She denies having any syncope or presyncope.  Past Medical History:  Diagnosis Date   Allergy    Cirrhosis (HCC)    Diabetes mellitus without complication (HCC)    Diverticulosis    DIVERTICULOSIS, COLON 01/03/2007   Fatty infiltration of liver    GERD (gastroesophageal reflux disease)    Hepatitis, chronic active (HCC)    NASH   Hiatal hernia    HIATAL HERNIA 12/07/2006   Hyperlipidemia    Interstitial lung disease (HCC)    Low back pain    Tuberculosis    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    Past Surgical History:  Procedure Laterality Date   APPENDECTOMY     EYE SURGERY     macular hole   ganglion cysts     left hand   HAND SURGERY  1996   SHOULDER SURGERY  2006   rt shoulder   TONSILLECTOMY     TUBAL LIGATION     UTERINE SUSPENSION      Current Medications: Current Meds  Medication Sig   aspirin 81 MG chewable tablet Chew 81 mg by mouth daily as needed for mild pain.   Cyanocobalamin 1000 MCG/ML KIT Inject 1,000 mg as directed. Pt does self injections twice a month.   diltiazem (CARDIZEM) 30  MG tablet Take 1 tablet (30 mg total) by mouth every 6 (six) hours as needed (palpitations).   esomeprazole (NEXIUM) 40 MG packet Take 40 mg by mouth 3 (three) times a week.   metFORMIN (GLUCOPHAGE) 500 MG tablet Take 1 tablet (500 mg total) by mouth 2 (two) times daily with a meal.   Multiple Vitamin (MULTIVITAMIN ADULT PO) Take by mouth. "One a day"   POTASSIUM PO Take by mouth. Twice a week.   tamsulosin (FLOMAX) 0.4 MG CAPS capsule Take by mouth daily.     Allergies:   Sulfasalazine, Celecoxib, Codeine, Ibuprofen, Meloxicam, Nsaids, Rofecoxib, Sulfa antibiotics, and Sulfonamide derivatives   Social and Family History: Reviewed in Epic  ROS:   Please see the history of present illness.    All other systems reviewed and are negative.  EKGs/Labs/Other Studies Reviewed Today:    Echocardiogram:  TTE 03/26/22 EF 60 to 65%, normal structure and function.  Left atrium is normal in size.   Monitors:  Zio 7d  - my interpretation Sinus rhythm 55 - 132 beats per minute, average 78 10 episodes of SVT occurred, longest episode was 6 beats  Stress testing:   Advanced imaging:   Cardiac catherization   EKG:  Last EKG results:  today - normal sinus rhythm   Recent Labs: 02/18/2022: NT-Pro BNP 59 03/02/2022: TSH 4.24 09/17/2022: ALT 36; BUN 12; Creatinine, Ser 0.75; Hemoglobin 15.5; Platelets 119.0; Potassium 3.6; Sodium 135     Physical Exam:    VS:  BP 128/80   Pulse 88   Ht 5' 4.5" (1.638 m)   Wt 135 lb (61.2 kg)   SpO2 93%   BMI 22.81 kg/m     Wt Readings from Last 3 Encounters:  10/09/22 135 lb (61.2 kg)  09/17/22 136 lb 12.8 oz (62.1 kg)  08/30/22 138 lb (62.6 kg)     GEN: Well nourished, well developed in no acute distress CARDIAC: RRR, no murmurs, rubs, gallops RESPIRATORY:  Normal work of breathing MUSCULOSKELETAL: no edema    ASSESSMENT & PLAN:    SVT She had brief atrial runs on her recent monitor, but I am not convinced these are representative  of her sustained symptoms. We discussed options which would include repeat monitor, placement of an implantable loop monitor, or proceeding with electrophysiology study At present, she does not feel that her symptoms warrant any further diagnostic procedure or intervention. She will continue to take diltiazem as needed and call us should she have recurrence    Medication Adjustments/Labs and Tests Ordered: Current medicines are reviewed at length with the patient today.  Concerns regarding medicines are outlined above.  No orders of the defined types were placed in this encounter.  No orders of the defined types were placed in this encounter.    Signed, Maurice Small, MD  10/09/2022 4:11 PM    [facility]

## 2022-10-12 DIAGNOSIS — N200 Calculus of kidney: Secondary | ICD-10-CM | POA: Diagnosis not present

## 2022-10-12 DIAGNOSIS — R8290 Unspecified abnormal findings in urine: Secondary | ICD-10-CM | POA: Diagnosis not present

## 2022-10-12 DIAGNOSIS — Z960 Presence of urogenital implants: Secondary | ICD-10-CM | POA: Diagnosis not present

## 2022-10-21 ENCOUNTER — Encounter: Payer: Self-pay | Admitting: Pulmonary Disease

## 2022-10-23 ENCOUNTER — Ambulatory Visit: Payer: Medicare Other | Admitting: Pulmonary Disease

## 2022-10-27 ENCOUNTER — Ambulatory Visit (INDEPENDENT_AMBULATORY_CARE_PROVIDER_SITE_OTHER): Payer: Medicare Other | Admitting: Pulmonary Disease

## 2022-10-27 ENCOUNTER — Encounter: Payer: Self-pay | Admitting: Pulmonary Disease

## 2022-10-27 VITALS — BP 126/68 | HR 89 | Ht 64.0 in | Wt 136.4 lb

## 2022-10-27 DIAGNOSIS — Z5181 Encounter for therapeutic drug level monitoring: Secondary | ICD-10-CM | POA: Diagnosis not present

## 2022-10-27 DIAGNOSIS — J8410 Pulmonary fibrosis, unspecified: Secondary | ICD-10-CM | POA: Diagnosis not present

## 2022-10-27 NOTE — Patient Instructions (Signed)
Will work with the pharmacy to contact you again to make sure he get restarted on the Esbriet Follow-up in 1 to 2 months

## 2022-10-27 NOTE — Progress Notes (Addendum)
Heather Townsend    [id number]    [birth year]  Primary Care Physician:Hunter, Heather Contes, MD  Referring Physician: Shelva Majestic, MD [address] [place],  [place] [address]  Chief complaint:   Follow up for lung fibrosis.  HPI: Heather Townsend is a 77 y.o. with history of Heather Townsend cirrhosis, GERD, hiatal hernia. She had an MRI of the abdomen as a part of follow-up for cirrhosis. It showed fibrosis at lung bases. She has been referred here for further follow-up. She has some mild dyspnea on exertion but no other symptoms confined to the chest. She denies any cough, wheezing, sputum production, hemoptysis. She does not have any symptoms suggestive of connective tissue, autoimmune disease. She does have history of unspecified lung fibrosis in her mother and aunt. She does not have any exposures at work to asbestos or other inhalants.  Evaluated by Dr. Deanne Coffer on November 2020 for elevated antibodies with no evidence of autoimmune disease Received rheumatology clinic note from Dr. Deanne Coffer, [facility] dated 04/11/2019. Repeat labs including CK, aldolase, CRP negative, sed rate 40.   No concern for active connective tissue disease.  Discussed at multidisciplinary ILD conference in Jan 2021 with diagnosis of likely IPF  Interim History:  Esbriet paperwork was initiated at last visit in November 2023 but she has not started it yet as pharmacy team cannot get in touch.  States that dyspnea on exertion is mildly worse since last visit.  No new complaints today.  Outpatient Encounter Medications as of 10/27/2022  Medication Sig   aspirin 81 MG chewable tablet Chew 81 mg by mouth daily as needed for mild pain.   Cyanocobalamin 1000 MCG/ML KIT Inject 1,000 mg as directed. Pt does self injections twice a month.   diltiazem (CARDIZEM) 30 MG tablet Take 1 tablet (30 mg total) by mouth every 6 (six) hours as needed (palpitations).   esomeprazole (NEXIUM) 40 MG packet Take 40 mg by  mouth 3 (three) times a week.   metFORMIN (GLUCOPHAGE) 500 MG tablet Take 1 tablet (500 mg total) by mouth 2 (two) times daily with a meal.   Multiple Vitamin (MULTIVITAMIN ADULT PO) Take by mouth. "One a day"   POTASSIUM PO Take by mouth. Twice a week.   tamsulosin (FLOMAX) 0.4 MG CAPS capsule Take by mouth daily.   No facility-administered encounter medications on file as of 10/27/2022.   Physical Exam: Blood pressure 126/70, pulse 84, temperature 98.2 F (36.8 C), temperature source Oral, height 5\' 4"  (1.626 m), weight 137 lb 3.2 oz (62.2 kg), SpO2 93 %. Gen:      No acute distress HEENT:  EOMI, sclera anicteric Neck:     No masses; no thyromegaly Lungs:    Clear to auscultation bilaterally; normal respiratory effort CV:         Regular rate and rhythm; no murmurs Abd:      + bowel sounds; soft, non-tender; no palpable masses, no distension Ext:    No edema; adequate peripheral perfusion Skin:      Warm and dry; no rash Neuro: alert and oriented x 3 Psych: normal mood and affect   Data Reviewed: CT abdomen 12/04/14- Reticulation at bases, unchanged since 2011 CT abdomen 06/02/11- Reticulation at bases. Images reviewed. 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 NSIP fibrosis High res CT 10/01/16- ILD, fibrosis, unchanged.  High res CT 10/20/17-mild progression pulmonary fibrosis, possible honeycombing High-res CT 04/04/2019-mild progression of pulmonary fibrosis in  UIP pattern. CT chest 10/26/2019-stable pulmonary fibrosis High-resolution CT chest 04/01/2022-pulmonary pattern of fibrosis in UIP pattern which is likely progressive, cirrhosis with portal hypertension. I have reviewed all images personally.   Serologies 09/05/15 CRP 0.4, sedimentation rate 43 Aldolase 11.2 P-ANCA 1:40 dsDNA- 6 Negative- Jo-1, centromere, RNP, RO, LA RA, CCP, Scl 70   PFTs 09/29/15 FVC 2.45 (80%), FEV1 2.18 (93%), F/F 89, TLC 79%, DLCO 53% Mild restriction, moderately severe  diffusion defect.  6-minute walk test 04/11/2019-510 m, exertional O2 sat 93%  Assessment:  Idiopathic pulmonary fibrosis Although there is minimal progression on recent CT compared to 2019 there is clearly worsening compared to her earliest high-res CT in 2017.  The pattern is UIP suggestive of idiopathic pulmonary fibrosis.  She does have a strong family history of pulmonary fibrosis in her mother and aunt. She does not have any exposure history or symptoms suggestive of connective tissue, autoimmune disease. Serologies are negative except for intermediate elevation in double-stranded DNA and mild elevation in ANCA of unclear significance.    She had declined lung biopsy in the past but at this point I do not think we need to proceed with that as the pattern is now is UIP.    Discussed antifibrotics but patient had been reluctant to try these in the past due to concern for liver damage in the setting of Nash cirrhosis.  Now with worsening symptoms and progression on CT scan she is willing to try medication We discussed different therapies available and have decided to start paperwork for Esbriet.  Need to follow hepatic panel for close monitoring of liver disease given history of cirrhosis. We discussed PFTs but she would like to avoid these  Health maintenance Does not want to get vaccinations.  Plan/Recommendations: -Check with pharmacy to make sure that she gets approved for Esbriet and starts therapy.  Chilton Greathouse MD [facility] 10/27/2022, 12:04 PM  CC: Shelva Majestic, MD

## 2022-11-03 DIAGNOSIS — M7989 Other specified soft tissue disorders: Secondary | ICD-10-CM | POA: Diagnosis not present

## 2022-11-03 DIAGNOSIS — M79662 Pain in left lower leg: Secondary | ICD-10-CM | POA: Diagnosis not present

## 2022-11-03 DIAGNOSIS — E119 Type 2 diabetes mellitus without complications: Secondary | ICD-10-CM | POA: Diagnosis not present

## 2022-11-03 DIAGNOSIS — I87392 Chronic venous hypertension (idiopathic) with other complications of left lower extremity: Secondary | ICD-10-CM | POA: Diagnosis not present

## 2022-11-03 DIAGNOSIS — M79605 Pain in left leg: Secondary | ICD-10-CM | POA: Diagnosis not present

## 2022-11-04 ENCOUNTER — Telehealth: Payer: Self-pay | Admitting: Pulmonary Disease

## 2022-11-04 NOTE — Telephone Encounter (Signed)
Patient checking on RX for Esbriet. Patient phone number is [phone].

## 2022-11-09 NOTE — Telephone Encounter (Signed)
Called [employer] to determine if patient is still enrolled into PAP program. Per program, they have not been able to complete onboarding for her either. They confirmed phone number on file is same as what we have on file.  Rep also confirmed that patient is actively enrolled in program currently so will need to complete onboarding before any shipments can be set up.  ATC patient to discuss. Unable to reach and VM box is not set up. Will f/u  Chesley Mires, PharmD, MPH, BCPS, CPP Clinical Pharmacist (Rheumatology and Pulmonology)

## 2022-11-12 NOTE — Telephone Encounter (Signed)
ATC patient to help complete on boarding for [employer] PAP for Esbriet. Unable to leave voicemail. Will be sending letter in mail with all necessary information so that patient may enroll in PAP.

## 2022-11-17 DIAGNOSIS — N200 Calculus of kidney: Secondary | ICD-10-CM | POA: Diagnosis not present

## 2022-11-17 DIAGNOSIS — R369 Urethral discharge, unspecified: Secondary | ICD-10-CM | POA: Diagnosis not present

## 2022-11-23 ENCOUNTER — Other Ambulatory Visit: Payer: Self-pay | Admitting: Nurse Practitioner

## 2022-11-23 DIAGNOSIS — K7460 Unspecified cirrhosis of liver: Secondary | ICD-10-CM | POA: Diagnosis not present

## 2022-11-23 DIAGNOSIS — K7581 Nonalcoholic steatohepatitis (NASH): Secondary | ICD-10-CM | POA: Diagnosis not present

## 2022-11-23 DIAGNOSIS — D376 Neoplasm of uncertain behavior of liver, gallbladder and bile ducts: Secondary | ICD-10-CM

## 2022-11-23 DIAGNOSIS — I8510 Secondary esophageal varices without bleeding: Secondary | ICD-10-CM | POA: Diagnosis not present

## 2022-11-23 DIAGNOSIS — M25552 Pain in left hip: Secondary | ICD-10-CM | POA: Diagnosis not present

## 2022-11-23 NOTE — Telephone Encounter (Signed)
Spoke with patient briefly on phone [phone]) regarding Esbriet onboarding. Pt stated she was at an appointment and to call her back in 30 minutes.  Darolyn Rua, PharmD Student [employer]

## 2022-11-23 NOTE — Telephone Encounter (Signed)
ATC patient back, unable to reach, left detailed VM with instructions and number for call back.  Darolyn Rua, PharmD Student [employer]

## 2022-11-24 DIAGNOSIS — M25552 Pain in left hip: Secondary | ICD-10-CM | POA: Diagnosis not present

## 2022-11-24 DIAGNOSIS — M545 Low back pain, unspecified: Secondary | ICD-10-CM | POA: Diagnosis not present

## 2022-11-25 ENCOUNTER — Telehealth: Payer: Self-pay | Admitting: Gastroenterology

## 2022-11-25 DIAGNOSIS — M5416 Radiculopathy, lumbar region: Secondary | ICD-10-CM | POA: Diagnosis not present

## 2022-11-25 NOTE — Telephone Encounter (Signed)
Inbound call from patient stating she was previously scheduled for a endoscopy and colonoscopy but had to cancel. Requesting to have procedure rescheduled. Is scheduled for office visit. Requesting a call back to discuss if she can have procedures scheduled or if she needs to come in the office first. Please advise, thank you.

## 2022-11-25 NOTE — Telephone Encounter (Signed)
The pt was last seen in 2022 and cancelled endo colon. She has been advised to keep appt as planned to discuss. The pt has been advised of the information and verbalized understanding.

## 2022-11-26 NOTE — Telephone Encounter (Signed)
Returned call to patient regarding Esbriet. Provided her with phone numbers for both [facility] and [facility]. Advised her to call Mendel Ryder first to complete onboarding. She verbalized understanding  Chesley Mires, PharmD, MPH, BCPS, CPP Clinical Pharmacist (Rheumatology and Pulmonology)

## 2022-11-29 ENCOUNTER — Emergency Department (HOSPITAL_COMMUNITY): Payer: Medicare Other

## 2022-11-29 ENCOUNTER — Emergency Department (HOSPITAL_COMMUNITY)
Admission: EM | Admit: 2022-11-29 | Discharge: 2022-11-29 | Disposition: A | Discharge status: Home / Self Care | Payer: Medicare Other | Attending: Emergency Medicine | Admitting: Emergency Medicine

## 2022-11-29 ENCOUNTER — Encounter (HOSPITAL_COMMUNITY): Payer: Self-pay

## 2022-11-29 DIAGNOSIS — M5116 Intervertebral disc disorders with radiculopathy, lumbar region: Secondary | ICD-10-CM | POA: Diagnosis not present

## 2022-11-29 DIAGNOSIS — M5136 Other intervertebral disc degeneration, lumbar region: Secondary | ICD-10-CM

## 2022-11-29 DIAGNOSIS — K7460 Unspecified cirrhosis of liver: Secondary | ICD-10-CM | POA: Diagnosis not present

## 2022-11-29 DIAGNOSIS — M50222 Other cervical disc displacement at C5-C6 level: Secondary | ICD-10-CM | POA: Diagnosis not present

## 2022-11-29 DIAGNOSIS — Z7984 Long term (current) use of oral hypoglycemic drugs: Secondary | ICD-10-CM | POA: Diagnosis not present

## 2022-11-29 DIAGNOSIS — M549 Dorsalgia, unspecified: Secondary | ICD-10-CM | POA: Diagnosis not present

## 2022-11-29 DIAGNOSIS — R1012 Left upper quadrant pain: Secondary | ICD-10-CM | POA: Insufficient documentation

## 2022-11-29 DIAGNOSIS — M5124 Other intervertebral disc displacement, thoracic region: Secondary | ICD-10-CM | POA: Diagnosis not present

## 2022-11-29 DIAGNOSIS — E119 Type 2 diabetes mellitus without complications: Secondary | ICD-10-CM | POA: Insufficient documentation

## 2022-11-29 DIAGNOSIS — R161 Splenomegaly, not elsewhere classified: Secondary | ICD-10-CM | POA: Diagnosis not present

## 2022-11-29 DIAGNOSIS — M47814 Spondylosis without myelopathy or radiculopathy, thoracic region: Secondary | ICD-10-CM | POA: Diagnosis not present

## 2022-11-29 DIAGNOSIS — T50905A Adverse effect of unspecified drugs, medicaments and biological substances, initial encounter: Secondary | ICD-10-CM | POA: Diagnosis not present

## 2022-11-29 DIAGNOSIS — R0602 Shortness of breath: Secondary | ICD-10-CM | POA: Diagnosis not present

## 2022-11-29 DIAGNOSIS — Z7982 Long term (current) use of aspirin: Secondary | ICD-10-CM | POA: Diagnosis not present

## 2022-11-29 DIAGNOSIS — R4182 Altered mental status, unspecified: Secondary | ICD-10-CM | POA: Diagnosis not present

## 2022-11-29 DIAGNOSIS — R109 Unspecified abdominal pain: Secondary | ICD-10-CM | POA: Diagnosis not present

## 2022-11-29 DIAGNOSIS — M5117 Intervertebral disc disorders with radiculopathy, lumbosacral region: Secondary | ICD-10-CM | POA: Diagnosis not present

## 2022-11-29 DIAGNOSIS — R1032 Left lower quadrant pain: Secondary | ICD-10-CM | POA: Insufficient documentation

## 2022-11-29 DIAGNOSIS — M48061 Spinal stenosis, lumbar region without neurogenic claudication: Secondary | ICD-10-CM | POA: Diagnosis not present

## 2022-11-29 DIAGNOSIS — G8929 Other chronic pain: Secondary | ICD-10-CM

## 2022-11-29 DIAGNOSIS — M5442 Lumbago with sciatica, left side: Secondary | ICD-10-CM | POA: Insufficient documentation

## 2022-11-29 DIAGNOSIS — K8020 Calculus of gallbladder without cholecystitis without obstruction: Secondary | ICD-10-CM | POA: Diagnosis not present

## 2022-11-29 DIAGNOSIS — M545 Low back pain, unspecified: Secondary | ICD-10-CM | POA: Diagnosis present

## 2022-11-29 DIAGNOSIS — K5750 Diverticulosis of both small and large intestine without perforation or abscess without bleeding: Secondary | ICD-10-CM | POA: Diagnosis not present

## 2022-11-29 DIAGNOSIS — R9431 Abnormal electrocardiogram [ECG] [EKG]: Secondary | ICD-10-CM | POA: Diagnosis not present

## 2022-11-29 LAB — URINALYSIS, ROUTINE W REFLEX MICROSCOPIC
Bilirubin Urine: NEGATIVE
Glucose, UA: NEGATIVE mg/dL
Hgb urine dipstick: NEGATIVE
Ketones, ur: NEGATIVE mg/dL
Leukocytes,Ua: NEGATIVE
Nitrite: NEGATIVE
Protein, ur: NEGATIVE mg/dL
Specific Gravity, Urine: >1.046 — ABNORMAL HIGH (ref 1.005–1.030)
pH: 5 (ref 5.0–8.0)

## 2022-11-29 LAB — COMPREHENSIVE METABOLIC PANEL
ALT: 46 U/L — ABNORMAL HIGH (ref 0–44)
AST: 61 U/L — ABNORMAL HIGH (ref 15–41)
Albumin: 3.5 g/dL (ref 3.5–5.0)
Alkaline Phosphatase: 77 U/L (ref 38–126)
Anion gap: 8 (ref 5–15)
BUN: 13 mg/dL (ref 8–23)
CO2: 25 mmol/L (ref 22–32)
Calcium: 9 mg/dL (ref 8.9–10.3)
Chloride: 103 mmol/L (ref 98–111)
Creatinine, Ser: 0.64 mg/dL (ref 0.44–1.00)
GFR, Estimated: >60 mL/min (ref >60)
Glucose, Bld: 200 mg/dL — ABNORMAL HIGH (ref 70–99)
Potassium: 4 mmol/L (ref 3.5–5.1)
Sodium: 136 mmol/L (ref 135–145)
Total Bilirubin: 0.7 mg/dL (ref 0.3–1.2)
Total Protein: 7.5 g/dL (ref 6.5–8.1)

## 2022-11-29 LAB — CBC WITH DIFFERENTIAL/PLATELET
Abs Immature Granulocytes: 0.01 10*3/uL (ref 0.00–0.07)
Basophils Absolute: 0 10*3/uL (ref 0.0–0.1)
Basophils Relative: 1 %
Eosinophils Absolute: 0.1 10*3/uL (ref 0.0–0.5)
Eosinophils Relative: 2 %
HCT: 43 % (ref 36.0–46.0)
Hemoglobin: 14.5 g/dL (ref 12.0–15.0)
Immature Granulocytes: 0 %
Lymphocytes Relative: 26 %
Lymphs Abs: 1.2 10*3/uL (ref 0.7–4.0)
MCH: 30.7 pg (ref 26.0–34.0)
MCHC: 33.7 g/dL (ref 30.0–36.0)
MCV: 91.1 fL (ref 80.0–100.0)
Monocytes Absolute: 0.4 10*3/uL (ref 0.1–1.0)
Monocytes Relative: 9 %
Neutro Abs: 3 10*3/uL (ref 1.7–7.7)
Neutrophils Relative %: 62 %
Platelets: 118 10*3/uL — ABNORMAL LOW (ref 150–400)
RBC: 4.72 MIL/uL (ref 3.87–5.11)
RDW: 13.5 % (ref 11.5–15.5)
WBC: 4.8 10*3/uL (ref 4.0–10.5)
nRBC: 0 % (ref 0.0–0.2)

## 2022-11-29 LAB — LIPASE, BLOOD: Lipase: 39 U/L (ref 11–51)

## 2022-11-29 MED ORDER — ONDANSETRON HCL 4 MG/2ML IJ SOLN
4.0000 mg | Freq: Once | INTRAMUSCULAR | Status: AC
  Administered 2022-11-29: 4 mg via INTRAVENOUS
  Filled 2022-11-29: qty 2

## 2022-11-29 MED ORDER — FENTANYL CITRATE PF 50 MCG/ML IJ SOSY
50.0000 ug | PREFILLED_SYRINGE | Freq: Once | INTRAMUSCULAR | Status: AC
  Administered 2022-11-29: 50 ug via INTRAVENOUS
  Filled 2022-11-29: qty 1

## 2022-11-29 MED ORDER — IOHEXOL 300 MG/ML  SOLN
100.0000 mL | Freq: Once | INTRAMUSCULAR | Status: AC | PRN
  Administered 2022-11-29: 100 mL via INTRAVENOUS

## 2022-11-29 NOTE — ED Notes (Signed)
Patient is informed that she will need a ride after receiving fentanyl.  Patient states she has someone she can call

## 2022-11-29 NOTE — ED Notes (Signed)
Patient states she has a ride coming and she declined a w/c.  Patient is able to ambulate without difficulty

## 2022-11-29 NOTE — Discharge Instructions (Addendum)
Thank you for letting us take care of you today.  Overall your workup was reassuring. We did not see any new findings on your CT brain or abdominal scan or the MRI of your back that require urgent intervention or admission to the hospital. The x-ray of your ankle was normal as well. As you are having a lot of side effects from the pain patch, we recommend stopping this and following up with your PCP or outpatient team for other ways to control your pain. I also believe you would benefit from seeing pain management. You may discuss this with your PCP. I also provided a resource guide with pain management clinics you may contact to set up an appointment.   For new or worsening symptoms, return to nearest ED for re-evaluation.

## 2022-11-29 NOTE — ED Notes (Signed)
Patch removed from patient's LUE

## 2022-11-29 NOTE — ED Provider Notes (Signed)
[place] [facility] AT [facility] Provider Note   CSN: [id number] Arrival date & time: 11/29/22  1430     History  Chief Complaint  Patient presents with   Back Pain    Heather Townsend is a 77 y.o. female with past medical history liver cirrhosis, type 2 diabetes, GERD, hyperlipidemia, interstitial lung disease, TB who presents to the ED complaining of severe back pain.  She states that this started in April 2024 after she was diagnosed with a kidney stone. She required multiple procedures and admission to hospital with lithotripsy, stent placement, and stone removal for treatment of this. Despite treatment for this and clearance by urology, pt has continued to have worsening, now severe lower back pain that radiates down her left leg. She was seen by multiple specialists including neurology who diagnosed her with sciatica. She has tried Tramadol at home and is now on a buprenorphine patch since last Friday but pain remains uncontrolled. Says since starting pain patch she has felt short of breath and with bilateral blurred vision which she attributes as being side effect of medication. Denies fall or injury to back or leg. At baseline, ambulates unassisted and lives alone. Denies previous imaging of back since onset of symptoms. Also c/o continued L sided "sharp" abdominal pains with increased flatulence. No vomiting, fever, dysuria, hematuria, paresthesias, bowel or bladder dysfunction. Says at times her left leg feels weak secondary to pain. No chest pain. Also c/o area of swelling to L lateral malleolus. Says this started in February 2024 when she had area of infection to ankle she self-treated at home and other symptoms have resolved but swelling has remained. States she had [place] recently that ruled out DVT. No history of malignancy or IVDU.       Home Medications Prior to Admission medications   Medication Sig Start Date End Date Taking? Authorizing Provider  aspirin  81 MG chewable tablet Chew 81 mg by mouth daily as needed for mild pain.    [provider]  Cyanocobalamin 1000 MCG/ML KIT Inject 1,000 mg as directed. Pt does self injections twice a month.    [provider]  diltiazem (CARDIZEM) 30 MG tablet Take 1 tablet (30 mg total) by mouth every 6 (six) hours as needed (palpitations). 07/28/22   Christell Constant, MD  esomeprazole (NEXIUM) 40 MG packet Take 40 mg by mouth 3 (three) times a week.    [provider]  metFORMIN (GLUCOPHAGE) 500 MG tablet Take 1 tablet (500 mg total) by mouth 2 (two) times daily with a meal. 09/17/22   Shelva Majestic, MD  Multiple Vitamin (MULTIVITAMIN ADULT PO) Take by mouth. "One a day"    [provider]  POTASSIUM PO Take by mouth. Twice a week.    [provider]  tamsulosin (FLOMAX) 0.4 MG CAPS capsule Take by mouth daily. 09/01/22   [provider]      Allergies    Sulfasalazine, Celecoxib, Codeine, Ibuprofen, Meloxicam, Nsaids, Rofecoxib, Sulfa antibiotics, and Sulfonamide derivatives    Review of Systems   Review of Systems  All other systems reviewed and are negative.   Physical Exam Updated Vital Signs BP (!) 146/77 (BP Location: Left Arm)   Pulse 78   Temp 97.8 F (36.6 C) (Oral)   Resp 16   SpO2 98%  Physical Exam Vitals and nursing note reviewed.  Constitutional:      General: She is not in acute distress.    Appearance:  Normal appearance. She is not ill-appearing, toxic-appearing or diaphoretic.  HENT:     Head: Normocephalic and atraumatic.     Mouth/Throat:     Mouth: Mucous membranes are moist.  Eyes:     General: No scleral icterus.    Extraocular Movements: Extraocular movements intact.     Conjunctiva/sclera: Conjunctivae normal.     Pupils: Pupils are equal, round, and reactive to light.  Cardiovascular:     Rate and Rhythm: Normal rate and regular rhythm.     Heart sounds: No murmur heard. Pulmonary:     Effort:  Pulmonary effort is normal. No respiratory distress.     Breath sounds: Normal breath sounds. No stridor. No wheezing or rales.  Abdominal:     General: Abdomen is flat. There is no distension.     Palpations: Abdomen is soft.     Tenderness: There is abdominal tenderness (mild LUQ and LLQ). There is no right CVA tenderness, left CVA tenderness, guarding or rebound.  Musculoskeletal:     Cervical back: Normal range of motion and neck supple. No rigidity or tenderness.     Comments: Moving all 4 extremities spontaneously and equally, able to change positions in bed without difficulty, small area of circular swelling to the left lateral malleolus without overlying erythema, increased warmth, induration, or fluctuance, joint is stable, soft compartments of the lower extremity, no calf tenderness bilaterally, no midline CTL spinal tenderness, step-offs, or deformities, moderate diffuse midline L-spine tenderness as well as tenderness over the left paraspinous muscles, no obvious deformity  Skin:    General: Skin is warm and dry.     Capillary Refill: Capillary refill takes less than 2 seconds.     Coloration: Skin is not jaundiced or pale.     Findings: No rash.  Neurological:     Mental Status: She is alert and oriented to person, place, and time.     GCS: GCS eye subscore is 4. GCS verbal subscore is 5. GCS motor subscore is 6.     Cranial Nerves: Cranial nerves 2-12 are intact. No cranial nerve deficit, dysarthria or facial asymmetry.     Sensory: Sensation is intact.     Motor: Motor function is intact. No weakness, tremor, atrophy, abnormal muscle tone or seizure activity.     Coordination: Coordination is intact.     Deep Tendon Reflexes:     Reflex Scores:      Patellar reflexes are 2+ on the right side and 2+ on the left side. Psychiatric:        Mood and Affect: Affect is tearful.        Behavior: Behavior is cooperative.     ED Results / Procedures / Treatments   Labs (all labs  ordered are listed, but only abnormal results are displayed) Labs Reviewed  CBC WITH DIFFERENTIAL/PLATELET - Abnormal; Notable for the following components:      Result Value   Platelets 118 (*)    All other components within normal limits  COMPREHENSIVE METABOLIC PANEL - Abnormal; Notable for the following components:   Glucose, Bld 200 (*)    AST 61 (*)    ALT 46 (*)    All other components within normal limits  URINALYSIS, ROUTINE W REFLEX MICROSCOPIC - Abnormal; Notable for the following components:   Color, Urine STRAW (*)    Specific Gravity, Urine >1.046 (*)    All other components within normal limits  LIPASE, BLOOD    EKG Normal sinus rhythm at  72 bpm, no acute ST ST changes, normal intervals, no STEMI  Radiology MR LUMBAR SPINE WO CONTRAST  Result Date: 11/29/2022 CLINICAL DATA:  Lumbar radiculopathy, symptoms persist with > 6 wks treatment. Chronic back pain radiating down the left leg. EXAM: MRI LUMBAR SPINE WITHOUT CONTRAST TECHNIQUE: Multiplanar, multisequence MR imaging of the lumbar spine was performed. No intravenous contrast was administered. COMPARISON:  CT abdomen and pelvis 11/29/2022. Lumbar spine MRI report from 08/11/2021 (images not currently available). FINDINGS: Segmentation:  Standard. Alignment: Slight right convex curvature of the lumbar spine. At most trace retrolisthesis of L2 on L3 and L3 on L4. Vertebrae: Mild chronic L1 and L2 superior endplate compression fractures. No acute fracture, suspicious marrow lesion, or significant marrow edema. Conus medullaris and cauda equina: Conus extends to the L1 level. Conus and cauda equina appear normal. Paraspinal and other soft tissues: Unremarkable. Disc levels: Disc desiccation throughout the lumbar spine with exception of L5-S1. Mild disc space narrowing at L2-3. T12-L1: Negative. L1-2: Mild disc bulging without stenosis. L2-3: Disc bulging and mild facet hypertrophy without stenosis. L3-4: Disc bulging mildly  eccentric to the left and mild facet hypertrophy result in mild bilateral lateral recess stenosis and mild left neural foraminal stenosis without spinal stenosis. L4-5: Disc bulging eccentric to the right and moderate facet and ligamentum flavum hypertrophy result in mild right greater than left lateral recess stenosis and borderline bilateral neural foraminal stenosis without spinal stenosis. Incidental perineural cysts along the L5 nerve roots bilaterally. L5-S1: Minimal disc bulging and mild facet hypertrophy without stenosis. Incidental perineural cyst along the right S1 nerve root. IMPRESSION: 1. Mild lumbar disc and facet degeneration without high-grade stenosis. 2. Mild lateral recess stenosis at L3-4 and L4-5. Electronically Signed   By: Sebastian Ache M.D.   On: 11/29/2022 18:00   MR THORACIC SPINE WO CONTRAST  Result Date: 11/29/2022 CLINICAL DATA:  Chronic back pain radiating down the left leg. EXAM: MRI THORACIC SPINE WITHOUT CONTRAST TECHNIQUE: Multiplanar, multisequence MR imaging of the thoracic spine was performed. No intravenous contrast was administered. COMPARISON:  Chest CT 04/01/2022 FINDINGS: Alignment:  Normal. Vertebrae: No acute fracture, suspicious marrow lesion, or significant marrow edema. Mild chronic T1 and T4 vertebral compression deformities. Cord:  Normal signal and morphology. Paraspinal and other soft tissues: Chronic lung disease as shown on the prior CT. No acute abnormality in the paraspinal soft tissues. Disc levels: Limited visualization of the cervical spine on sagittal images demonstrates disc bulging and endplate spurring at C5-6 resulting in likely mild spinal stenosis. There is minor thoracic spondylosis with shallow central disc protrusions at T7-8 and T8-9 not resulting in stenosis or spinal cord mass effect. IMPRESSION: 1. Mild thoracic spondylosis without stenosis. 2. Incompletely evaluated cervical spondylosis with mild spinal stenosis at C5-6. Electronically  Signed   By: Sebastian Ache M.D.   On: 11/29/2022 17:54   CT ABDOMEN PELVIS W CONTRAST  Result Date: 11/29/2022 CLINICAL DATA:  Abdomen pain EXAM: CT ABDOMEN AND PELVIS WITH CONTRAST TECHNIQUE: Multidetector CT imaging of the abdomen and pelvis was performed using the standard protocol following bolus administration of intravenous contrast. RADIATION DOSE REDUCTION: This exam was performed according to the departmental dose-optimization program which includes automated exposure control, adjustment of the mA and/or kV according to patient size and/or use of iterative reconstruction technique. CONTRAST:  OMNIPAQUE IOHEXOL 300 MG/ML  SOLN COMPARISON:  CT 08/30/2022 FINDINGS: Lower chest: Fibrotic lung disease at the bases. No acute airspace disease. Hepatobiliary: Liver cirrhosis.  Gallstone.  No  biliary dilatation Pancreas: Unremarkable. No pancreatic ductal dilatation or surrounding inflammatory changes. Spleen: Upper normal to borderline enlarged, craniocaudal measurement of 13 cm. Adrenals/Urinary Tract: Adrenal glands are within normal limits. Kidneys show no hydronephrosis. Subcentimeter hypodensity mid pole right kidney, too small to further characterize, no imaging follow-up is recommended. Bladder is normal Stomach/Bowel: Stomach nonenlarged. 3.8 cm duodenal diverticulum. No acute bowel wall thickening. Diverticular disease of left colon. Vascular/Lymphatic: Moderate aortic atherosclerosis. No aneurysm. Retroaortic left renal vein. No suspicious lymph nodes. Gastroesophageal varices. Reproductive: Uterus and bilateral adnexa are unremarkable. Other: Negative for pelvic effusion or free air. Musculoskeletal: No acute or suspicious osseous abnormality. Minimal chronic superior endplate deformity at L1 and L2. IMPRESSION: 1. No CT evidence for acute intra-abdominal or pelvic abnormality. 2. Liver cirrhosis. Borderline splenomegaly. Gastroesophageal varices. 3. Cholelithiasis. 4. Diverticular disease of  left colon without acute inflammatory process. 5. Fibrotic lung disease at the bases. 6. Aortic atherosclerosis. Aortic Atherosclerosis (ICD10-I70.0). Electronically Signed   By: Jasmine Pang M.D.   On: 11/29/2022 17:37   CT Head Wo Contrast  Result Date: 11/29/2022 CLINICAL DATA:  Mental status change EXAM: CT HEAD WITHOUT CONTRAST TECHNIQUE: Contiguous axial images were obtained from the base of the skull through the vertex without intravenous contrast. RADIATION DOSE REDUCTION: This exam was performed according to the departmental dose-optimization program which includes automated exposure control, adjustment of the mA and/or kV according to patient size and/or use of iterative reconstruction technique. COMPARISON:  CT brain 04/27/2011 FINDINGS: Brain: No acute territorial infarction, hemorrhage or intracranial mass. The ventricles are nonenlarged. Mild atrophy Vascular: No hyperdense vessels. Mild carotid vascular calcification Skull: Normal. Negative for fracture or focal lesion. Sinuses/Orbits: No acute finding. Other: None IMPRESSION: No CT evidence for acute intracranial abnormality. Mild atrophy. Electronically Signed   By: Jasmine Pang M.D.   On: 11/29/2022 17:31   DG Ankle 2 Views Left  Result Date: 11/29/2022 CLINICAL DATA:  sob EXAM: LEFT ANKLE - 2 VIEW COMPARISON:  None Available. FINDINGS: There is no evidence of fracture, dislocation, or joint effusion. There is no evidence of arthropathy or other focal bone abnormality. Soft tissues are unremarkable. IMPRESSION: Negative. Electronically Signed   By: Corlis Leak M.D.   On: 11/29/2022 16:03   DG Chest 2 View  Result Date: 11/29/2022 CLINICAL DATA:  sob EXAM: CHEST - 2 VIEW COMPARISON:  08/14/2018 FINDINGS: Slight progression of coarse peripheral interstitial alveolar opacities, left greater than right. Heart size and mediastinal contours are within normal limits. Aortic Atherosclerosis (ICD10-170.0). No effusion. Visualized bones  unremarkable. IMPRESSION: Slight progression of coarse peripheral interstitial and alveolar opacities, left greater than right. Electronically Signed   By: Corlis Leak M.D.   On: 11/29/2022 16:01    Procedures Procedures    Medications Ordered in ED Medications  fentaNYL (SUBLIMAZE) injection 50 mcg (50 mcg Intravenous Given 11/29/22 1602)  ondansetron (ZOFRAN) injection 4 mg (4 mg Intravenous Given 11/29/22 1601)  iohexol (OMNIPAQUE) 300 MG/ML solution 100 mL (100 mLs Intravenous Contrast Given 11/29/22 1713)  fentaNYL (SUBLIMAZE) injection 50 mcg (50 mcg Intravenous Given 11/29/22 1813)    ED Course/ Medical Decision Making/ A&P                             Medical Decision Making Amount and/or Complexity of Data Reviewed Labs: ordered. Decision-making details documented in ED Course. Radiology: ordered. Decision-making details documented in ED Course. ECG/medicine tests: ordered. Decision-making details documented in ED Course.  Risk Prescription drug management.   Medical Decision Making:   Heather Townsend is a 77 y.o. female who presented to the ED today with back pain detailed above.    Patient's presentation is complicated by their history of ILD, HLD.  Patient placed on continuous vitals and telemetry monitoring while in ED which was reviewed periodically.  Complete initial physical exam performed, notably the patient  was tearful. No midline CT spinal tenderness, stepoffs, or deformities. No meningismus. Nontoxic appearing. Diffuse L spine midline and L paraspinous tenderness. Neurologically intact. LUQ and LLQ abd tenderness without rebound, guarding, or peritoneal signs.    Reviewed and confirmed nursing documentation for past medical history, family history, social history.    Initial Assessment:   With the patient's presentation of back pain, the emergent differential diagnosis for back pain includes but is not limited to fracture, muscle strain, cauda equina, spinal  stenosis, DDD, ankylosing spondylitis, acute ligamentous injury, disk herniation, spondylolisthesis, epidural compression syndrome, metastatic cancer, transverse myelitis, vertebral osteomyelitis, diskitis, kidney stone, pyelonephritis, AAA, Perforated ulcer, retrocecal appendicitis, pancreatitis, bowel obstruction, retroperitoneal hemorrhage or mass, meningitis.   Initial Plan:  Screening labs including CBC and Metabolic panel to evaluate for infectious or metabolic etiology of disease.  Urinalysis with reflex culture ordered to evaluate for UTI or relevant urologic/nephrologic pathology.  CXR to evaluate for structural/infectious intrathoracic pathology.  EKG to evaluate for cardiac pathology CTAP to assess for intra-abdominal pathology Lipase to assess for pancreatitis CT brain to assess for intracranial pathology MR T and L spine to assess for spinal pathology L ankle XR at pt request to assess for infection Symptomatic management Objective evaluation as reviewed   Initial Study Results:   Laboratory  All laboratory results reviewed without evidence of clinically relevant pathology.   Exceptions include: Glucose 200, AST 61, ALT 46  Radiology:  All images reviewed independently. Agree with radiology report at this time.   MR LUMBAR SPINE WO CONTRAST  Result Date: 11/29/2022 CLINICAL DATA:  Lumbar radiculopathy, symptoms persist with > 6 wks treatment. Chronic back pain radiating down the left leg. EXAM: MRI LUMBAR SPINE WITHOUT CONTRAST TECHNIQUE: Multiplanar, multisequence MR imaging of the lumbar spine was performed. No intravenous contrast was administered. COMPARISON:  CT abdomen and pelvis 11/29/2022. Lumbar spine MRI report from 08/11/2021 (images not currently available). FINDINGS: Segmentation:  Standard. Alignment: Slight right convex curvature of the lumbar spine. At most trace retrolisthesis of L2 on L3 and L3 on L4. Vertebrae: Mild chronic L1 and L2 superior endplate  compression fractures. No acute fracture, suspicious marrow lesion, or significant marrow edema. Conus medullaris and cauda equina: Conus extends to the L1 level. Conus and cauda equina appear normal. Paraspinal and other soft tissues: Unremarkable. Disc levels: Disc desiccation throughout the lumbar spine with exception of L5-S1. Mild disc space narrowing at L2-3. T12-L1: Negative. L1-2: Mild disc bulging without stenosis. L2-3: Disc bulging and mild facet hypertrophy without stenosis. L3-4: Disc bulging mildly eccentric to the left and mild facet hypertrophy result in mild bilateral lateral recess stenosis and mild left neural foraminal stenosis without spinal stenosis. L4-5: Disc bulging eccentric to the right and moderate facet and ligamentum flavum hypertrophy result in mild right greater than left lateral recess stenosis and borderline bilateral neural foraminal stenosis without spinal stenosis. Incidental perineural cysts along the L5 nerve roots bilaterally. L5-S1: Minimal disc bulging and mild facet hypertrophy without stenosis. Incidental perineural cyst along the right S1 nerve root. IMPRESSION: 1. Mild lumbar disc and facet degeneration without high-grade  stenosis. 2. Mild lateral recess stenosis at L3-4 and L4-5. Electronically Signed   By: Sebastian Ache M.D.   On: 11/29/2022 18:00   MR THORACIC SPINE WO CONTRAST  Result Date: 11/29/2022 CLINICAL DATA:  Chronic back pain radiating down the left leg. EXAM: MRI THORACIC SPINE WITHOUT CONTRAST TECHNIQUE: Multiplanar, multisequence MR imaging of the thoracic spine was performed. No intravenous contrast was administered. COMPARISON:  Chest CT 04/01/2022 FINDINGS: Alignment:  Normal. Vertebrae: No acute fracture, suspicious marrow lesion, or significant marrow edema. Mild chronic T1 and T4 vertebral compression deformities. Cord:  Normal signal and morphology. Paraspinal and other soft tissues: Chronic lung disease as shown on the prior CT. No acute  abnormality in the paraspinal soft tissues. Disc levels: Limited visualization of the cervical spine on sagittal images demonstrates disc bulging and endplate spurring at C5-6 resulting in likely mild spinal stenosis. There is minor thoracic spondylosis with shallow central disc protrusions at T7-8 and T8-9 not resulting in stenosis or spinal cord mass effect. IMPRESSION: 1. Mild thoracic spondylosis without stenosis. 2. Incompletely evaluated cervical spondylosis with mild spinal stenosis at C5-6. Electronically Signed   By: Sebastian Ache M.D.   On: 11/29/2022 17:54   CT ABDOMEN PELVIS W CONTRAST  Result Date: 11/29/2022 CLINICAL DATA:  Abdomen pain EXAM: CT ABDOMEN AND PELVIS WITH CONTRAST TECHNIQUE: Multidetector CT imaging of the abdomen and pelvis was performed using the standard protocol following bolus administration of intravenous contrast. RADIATION DOSE REDUCTION: This exam was performed according to the departmental dose-optimization program which includes automated exposure control, adjustment of the mA and/or kV according to patient size and/or use of iterative reconstruction technique. CONTRAST:  OMNIPAQUE IOHEXOL 300 MG/ML  SOLN COMPARISON:  CT 08/30/2022 FINDINGS: Lower chest: Fibrotic lung disease at the bases. No acute airspace disease. Hepatobiliary: Liver cirrhosis.  Gallstone.  No biliary dilatation Pancreas: Unremarkable. No pancreatic ductal dilatation or surrounding inflammatory changes. Spleen: Upper normal to borderline enlarged, craniocaudal measurement of 13 cm. Adrenals/Urinary Tract: Adrenal glands are within normal limits. Kidneys show no hydronephrosis. Subcentimeter hypodensity mid pole right kidney, too small to further characterize, no imaging follow-up is recommended. Bladder is normal Stomach/Bowel: Stomach nonenlarged. 3.8 cm duodenal diverticulum. No acute bowel wall thickening. Diverticular disease of left colon. Vascular/Lymphatic: Moderate aortic atherosclerosis. No  aneurysm. Retroaortic left renal vein. No suspicious lymph nodes. Gastroesophageal varices. Reproductive: Uterus and bilateral adnexa are unremarkable. Other: Negative for pelvic effusion or free air. Musculoskeletal: No acute or suspicious osseous abnormality. Minimal chronic superior endplate deformity at L1 and L2. IMPRESSION: 1. No CT evidence for acute intra-abdominal or pelvic abnormality. 2. Liver cirrhosis. Borderline splenomegaly. Gastroesophageal varices. 3. Cholelithiasis. 4. 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 5. Fibrotic lung disease at the bases. 6. Aortic atherosclerosis. Aortic Atherosclerosis (ICD10-I70.0). Electronically Signed   By: Jasmine Pang M.D.   On: 11/29/2022 17:37   CT Head Wo Contrast  Result Date: 11/29/2022 CLINICAL DATA:  Mental status change EXAM: CT HEAD WITHOUT CONTRAST TECHNIQUE: Contiguous axial images were obtained from the base of the skull through the vertex without intravenous contrast. RADIATION DOSE REDUCTION: This exam was performed according to the departmental dose-optimization program which includes automated exposure control, adjustment of the mA and/or kV according to patient size and/or use of iterative reconstruction technique. COMPARISON:  CT brain 04/27/2011 FINDINGS: Brain: No acute territorial infarction, hemorrhage or intracranial mass. The ventricles are nonenlarged. Mild atrophy Vascular: No hyperdense vessels. Mild carotid vascular calcification Skull: Normal. Negative for fracture or  focal lesion. Sinuses/Orbits: No acute finding. Other: None IMPRESSION: No CT evidence for acute intracranial abnormality. Mild atrophy. Electronically Signed   By: Jasmine Pang M.D.   On: 11/29/2022 17:31   DG Ankle 2 Views Left  Result Date: 11/29/2022 CLINICAL DATA:  sob EXAM: LEFT ANKLE - 2 VIEW COMPARISON:  None Available. FINDINGS: There is no evidence of fracture, dislocation, or joint effusion. There is no evidence of  arthropathy or other focal bone abnormality. Soft tissues are unremarkable. IMPRESSION: Negative. Electronically Signed   By: Corlis Leak M.D.   On: 11/29/2022 16:03   DG Chest 2 View  Result Date: 11/29/2022 CLINICAL DATA:  sob EXAM: CHEST - 2 VIEW COMPARISON:  08/14/2018 FINDINGS: Slight progression of coarse peripheral interstitial alveolar opacities, left greater than right. Heart size and mediastinal contours are within normal limits. Aortic Atherosclerosis (ICD10-170.0). No effusion. Visualized bones unremarkable. IMPRESSION: Slight progression of coarse peripheral interstitial and alveolar opacities, left greater than right. Electronically Signed   By: Corlis Leak M.D.   On: 11/29/2022 16:01    Final Assessment and Plan:   Patient presents to ED c/o back pain. No history of malignancy or IV drug use. No severe trauma to the back.  Symptoms have been ongoing since having a kidney stone in April of this year.  She has been seen by multiple outpatient specialist and diagnosed with sciatica.  States that she has trialed multiple medications most recently of a buprenorphine pain patch.  Has also tried tramadol and oxycodone.  Denies previous imaging.  No imaging seen in system and reviewed.  Patient does have midline lumbar spinal tenderness.  She is neurologically intact.  She also has multiple other complaints as above.  Mild diffuse left-sided abdominal tenderness but abdomen soft and without rebound, guarding, or peritoneal signs.  No CVA tenderness.  Patient initially tearful expressed significant frustration with lack of pain control at home. With worsening pain without previous imaging, ordered labs and MRI scan as above for further assessment.  Patient also complaining of shortness of breath, likely side effect from pain patch.  She does have a history of interstitial lung disease as well though.  Obtained chest x-ray for further evaluation of this. No leukocytosis, no fever, do not suspect PNA or  acute infectious process. No history malignancy, IVDU, low suspicion for cauda equina. Also with abdominal pain ongoing for some time. CTAP without acute changes. CT brain without acute changes. No significant findings on labs. Pt appears more comfortable following Fentanyl for pain control. Overall reassuring workup. Discussed with attending physician who co-signed this note and agrees workup is reassuring and pt can be discharged home with outpatient follow up.  Extensive discussion of findings with patient as well as that I believe that she would benefit from pain management.  Provided with pain management resource guide.  Aware that she should closely follow-up with outpatient team including primary care.  Patient expressed understanding of plan.  Strict ED return precautions given, all questions answered, and stable for discharge.   Clinical Impression:  1. Degenerative disc disease, lumbar   2. Chronic left-sided low back pain with left-sided sciatica   3. Abdominal pain, unspecified abdominal location   4. Medication side effect, initial encounter      Discharge           Final Clinical Impression(s) / ED Diagnoses Final diagnoses:  Degenerative disc disease, lumbar  Chronic left-sided low back pain with left-sided sciatica  Abdominal pain, unspecified abdominal location  Medication side effect, initial encounter    Rx / DC Orders ED Discharge Orders     None         Richardson Dopp 11/29/22 1857    Jacalyn Lefevre, MD 11/29/22 1901

## 2022-11-29 NOTE — ED Notes (Signed)
Patient reminded to call her ride

## 2022-11-29 NOTE — ED Triage Notes (Signed)
Pt arrived via POV, c/o back and leg pain. States she has been sent to several specialist, neurology prescribed her a pain patch and she has been having issues with breathing and vision since.

## 2022-12-02 ENCOUNTER — Other Ambulatory Visit: Payer: Medicare Other

## 2022-12-02 DIAGNOSIS — Z79899 Other long term (current) drug therapy: Secondary | ICD-10-CM | POA: Diagnosis not present

## 2022-12-02 DIAGNOSIS — G894 Chronic pain syndrome: Secondary | ICD-10-CM | POA: Diagnosis not present

## 2022-12-02 DIAGNOSIS — M7918 Myalgia, other site: Secondary | ICD-10-CM | POA: Diagnosis not present

## 2022-12-02 DIAGNOSIS — G603 Idiopathic progressive neuropathy: Secondary | ICD-10-CM | POA: Diagnosis not present

## 2022-12-08 ENCOUNTER — Other Ambulatory Visit: Payer: Self-pay | Admitting: Pharmacist

## 2022-12-08 DIAGNOSIS — J8410 Pulmonary fibrosis, unspecified: Secondary | ICD-10-CM

## 2022-12-08 MED ORDER — PIRFENIDONE 267 MG PO TABS
801.0000 mg | ORAL_TABLET | Freq: Three times a day (TID) | ORAL | 1 refills | Status: DC
Start: 2022-12-08 — End: 2023-07-14

## 2022-12-08 NOTE — Telephone Encounter (Signed)
Refill sent for ESBRIET to [facility] ([facility]) for Esbriet: [phone]  Dose: 801 mg three times daily  Last OV: 10/27/22 Provider: Dr. Isaiah Serge  Next OV: 12/09/2022  Chesley Mires, PharmD, MPH, BCPS Clinical Pharmacist (Rheumatology and Pulmonology)

## 2022-12-09 ENCOUNTER — Ambulatory Visit (INDEPENDENT_AMBULATORY_CARE_PROVIDER_SITE_OTHER): Payer: Medicare Other | Admitting: Pulmonary Disease

## 2022-12-09 ENCOUNTER — Encounter: Payer: Self-pay | Admitting: Pulmonary Disease

## 2022-12-09 VITALS — BP 116/60 | HR 103 | Temp 97.8°F | Ht 64.0 in | Wt 129.2 lb

## 2022-12-09 DIAGNOSIS — L0100 Impetigo, unspecified: Secondary | ICD-10-CM | POA: Diagnosis not present

## 2022-12-09 DIAGNOSIS — Z5181 Encounter for therapeutic drug level monitoring: Secondary | ICD-10-CM | POA: Diagnosis not present

## 2022-12-09 DIAGNOSIS — J8410 Pulmonary fibrosis, unspecified: Secondary | ICD-10-CM

## 2022-12-09 DIAGNOSIS — L02214 Cutaneous abscess of groin: Secondary | ICD-10-CM | POA: Diagnosis not present

## 2022-12-09 LAB — COMPREHENSIVE METABOLIC PANEL
ALT: 47 U/L — ABNORMAL HIGH (ref 0–35)
AST: 42 U/L — ABNORMAL HIGH (ref 0–37)
Albumin: 3.9 g/dL (ref 3.5–5.2)
Alkaline Phosphatase: 104 U/L (ref 39–117)
BUN: 18 mg/dL (ref 6–23)
CO2: 29 mEq/L (ref 19–32)
Calcium: 9.8 mg/dL (ref 8.4–10.5)
Chloride: 98 mEq/L (ref 96–112)
Creatinine, Ser: 0.88 mg/dL (ref 0.40–1.20)
GFR: 64.31 mL/min (ref >60.00)
Glucose, Bld: 332 mg/dL — ABNORMAL HIGH (ref 70–99)
Potassium: 3.9 mEq/L (ref 3.5–5.1)
Sodium: 135 mEq/L (ref 135–145)
Total Bilirubin: 0.9 mg/dL (ref 0.2–1.2)
Total Protein: 7.9 g/dL (ref 6.0–8.3)

## 2022-12-09 NOTE — Patient Instructions (Signed)
Will order monthly comprehensive metabolic panel starting in August 2024 Will order high-res CT in 3 months Return to clinic in 3 months

## 2022-12-09 NOTE — Progress Notes (Signed)
Heather Townsend    [id number]    11-16-1945  Primary Care Physician:Hunter, Aldine Contes, MD  Referring Physician: Shelva Majestic, MD [address] [place],  [place] [address]  Chief complaint:   Follow up for lung fibrosis.  HPI: Heather Townsend is a 77 y.o. with history of Heather Townsend cirrhosis, GERD, hiatal hernia. She had an MRI of the abdomen as a part of follow-up for cirrhosis. It showed fibrosis at lung bases. She has been referred here for further follow-up. She has some mild dyspnea on exertion but no other symptoms confined to the chest. She denies any cough, wheezing, sputum production, hemoptysis. She does not have any symptoms suggestive of connective tissue, autoimmune disease. She does have history of unspecified lung fibrosis in her mother and aunt. She does not have any exposures at work to asbestos or other inhalants.  Evaluated by Dr. Deanne Coffer on November 2020 for elevated antibodies with no evidence of autoimmune disease Received rheumatology clinic note from Dr. Deanne Coffer, [facility] dated 04/11/2019. Repeat labs including CK, aldolase, CRP negative, sed rate 40.   No concern for active connective tissue disease.  Discussed at multidisciplinary ILD conference in Jan 2021 with diagnosis of likely IPF  Interim History:  States that she has mild dyspnea on exertion which has worsened over the years. Esbriet paperwork was added 2 months ago.  She is about to get her first shipment delivered tomorrow   Outpatient Encounter Medications as of 12/09/2022  Medication Sig   aspirin 81 MG chewable tablet Chew 81 mg by mouth daily as needed for mild pain.   Cyanocobalamin 1000 MCG/ML KIT Inject 1,000 mg as directed. Pt does self injections twice a month.   diltiazem (CARDIZEM) 30 MG tablet Take 1 tablet (30 mg total) by mouth every 6 (six) hours as needed (palpitations).   esomeprazole (NEXIUM) 40 MG packet Take 40 mg by mouth 3 (three) times a week.   metFORMIN  (GLUCOPHAGE) 500 MG tablet Take 1 tablet (500 mg total) by mouth 2 (two) times daily with a meal.   Multiple Vitamin (MULTIVITAMIN ADULT PO) Take by mouth. "One a day"   Pirfenidone 267 MG TABS Take 3 tablets (801 mg total) by mouth 3 (three) times daily with meals.   POTASSIUM PO Take by mouth. Twice a week.   [DISCONTINUED] tamsulosin (FLOMAX) 0.4 MG CAPS capsule Take by mouth daily.   No facility-administered encounter medications on file as of 12/09/2022.   Physical Exam: Blood pressure 116/60, pulse (!) 103, temperature 97.8 F (36.6 C), temperature source Oral, height 5\' 4"  (1.626 m), weight 129 lb 3.2 oz (58.6 kg), SpO2 93 %. Gen:      No acute distress HEENT:  EOMI, sclera anicteric Neck:     No masses; no thyromegaly Lungs:    Clear to auscultation bilaterally; normal respiratory effort CV:         Regular rate and rhythm; no murmurs Abd:      + bowel sounds; soft, non-tender; no palpable masses, no distension Ext:    No edema; adequate peripheral perfusion Skin:      Warm and dry; no rash Neuro: alert and oriented x 3 Psych: normal mood and affect   Data Reviewed: CT abdomen 12/04/14- Reticulation at bases, unchanged since 2011 CT abdomen 06/02/11- Reticulation at bases. Images reviewed. 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 NSIP fibrosis High res CT 10/01/16- ILD, fibrosis, unchanged.  High res CT  10/20/17-mild progression pulmonary fibrosis, possible honeycombing High-res CT 04/04/2019-mild progression of pulmonary fibrosis in UIP pattern. CT chest 10/26/2019-stable pulmonary fibrosis High-resolution CT chest 04/01/2022-pulmonary pattern of fibrosis in UIP pattern which is likely progressive, cirrhosis with portal hypertension. I have reviewed all images personally.   Serologies 09/05/15 CRP 0.4, sedimentation rate 43 Aldolase 11.2 P-ANCA 1:40 dsDNA- 6 Negative- Jo-1, centromere, RNP, RO, LA RA, CCP, Scl 70   PFTs 09/29/15 FVC 2.45 (80%),  FEV1 2.18 (93%), F/F 89, TLC 79%, DLCO 53% Mild restriction, moderately severe diffusion defect.  6-minute walk test 04/11/2019-510 m, exertional O2 sat 93%  Assessment:  Idiopathic pulmonary fibrosis Although there is minimal progression on recent CT compared to 2019 there is clearly worsening compared to her earliest high-res CT in 2017.  The pattern is UIP suggestive of idiopathic pulmonary fibrosis.  She does have a strong family history of pulmonary fibrosis in her mother and aunt. She does not have any exposure history or symptoms suggestive of connective tissue, autoimmune disease. Serologies are negative except for intermediate elevation in double-stranded DNA and mild elevation in ANCA of unclear significance.    She had declined lung biopsy in the past but at this point I do not think we need to proceed with that as the pattern is now is UIP.    Discussed antifibrotics but patient had been reluctant to try these in the past due to concern for liver damage in the setting of [place] cirrhosis.  Now with worsening symptoms and progression on CT scan she is willing to try medication.  She will start Esbriet tomorrow.  Need to follow hepatic panel for close monitoring of liver disease given history of cirrhosis. We discussed PFTs but she would like to avoid these. Schedule follow-up high-res CT in 3 months.  Health maintenance Does not want to get vaccinations.  Plan/Recommendations: Start Esbriet Check monthly hepatic panel High-res CT in 3 months  Chilton Greathouse MD [facility] 12/09/2022, 1:11 PM  CC: Shelva Majestic, MD

## 2022-12-18 ENCOUNTER — Ambulatory Visit: Payer: Medicare Other | Admitting: Family Medicine

## 2022-12-21 ENCOUNTER — Ambulatory Visit
Admission: RE | Admit: 2022-12-21 | Discharge: 2022-12-21 | Disposition: A | Discharge status: Home / Self Care | Payer: Medicare Other | Source: Ambulatory Visit | Attending: Nurse Practitioner | Admitting: Nurse Practitioner

## 2022-12-21 DIAGNOSIS — K8020 Calculus of gallbladder without cholecystitis without obstruction: Secondary | ICD-10-CM | POA: Diagnosis not present

## 2022-12-21 DIAGNOSIS — D376 Neoplasm of uncertain behavior of liver, gallbladder and bile ducts: Secondary | ICD-10-CM

## 2022-12-21 DIAGNOSIS — K769 Liver disease, unspecified: Secondary | ICD-10-CM | POA: Diagnosis not present

## 2022-12-21 DIAGNOSIS — K7460 Unspecified cirrhosis of liver: Secondary | ICD-10-CM

## 2022-12-23 DIAGNOSIS — H43391 Other vitreous opacities, right eye: Secondary | ICD-10-CM | POA: Diagnosis not present

## 2022-12-30 DIAGNOSIS — M5412 Radiculopathy, cervical region: Secondary | ICD-10-CM | POA: Diagnosis not present

## 2022-12-30 DIAGNOSIS — G603 Idiopathic progressive neuropathy: Secondary | ICD-10-CM | POA: Diagnosis not present

## 2022-12-30 DIAGNOSIS — M5417 Radiculopathy, lumbosacral region: Secondary | ICD-10-CM | POA: Diagnosis not present

## 2022-12-31 DIAGNOSIS — M5416 Radiculopathy, lumbar region: Secondary | ICD-10-CM | POA: Diagnosis not present

## 2022-12-31 DIAGNOSIS — M5459 Other low back pain: Secondary | ICD-10-CM | POA: Diagnosis not present

## 2023-01-11 DIAGNOSIS — M5459 Other low back pain: Secondary | ICD-10-CM | POA: Diagnosis not present

## 2023-01-11 DIAGNOSIS — M5416 Radiculopathy, lumbar region: Secondary | ICD-10-CM | POA: Diagnosis not present

## 2023-01-18 DIAGNOSIS — M5416 Radiculopathy, lumbar region: Secondary | ICD-10-CM | POA: Diagnosis not present

## 2023-01-18 DIAGNOSIS — M5459 Other low back pain: Secondary | ICD-10-CM | POA: Diagnosis not present

## 2023-01-21 DIAGNOSIS — M5416 Radiculopathy, lumbar region: Secondary | ICD-10-CM | POA: Diagnosis not present

## 2023-01-21 DIAGNOSIS — M5459 Other low back pain: Secondary | ICD-10-CM | POA: Diagnosis not present

## 2023-01-26 DIAGNOSIS — M5416 Radiculopathy, lumbar region: Secondary | ICD-10-CM | POA: Diagnosis not present

## 2023-01-26 DIAGNOSIS — M5459 Other low back pain: Secondary | ICD-10-CM | POA: Diagnosis not present

## 2023-01-28 DIAGNOSIS — M5416 Radiculopathy, lumbar region: Secondary | ICD-10-CM | POA: Diagnosis not present

## 2023-01-28 DIAGNOSIS — M5459 Other low back pain: Secondary | ICD-10-CM | POA: Diagnosis not present

## 2023-02-02 DIAGNOSIS — M5416 Radiculopathy, lumbar region: Secondary | ICD-10-CM | POA: Diagnosis not present

## 2023-02-02 DIAGNOSIS — M5459 Other low back pain: Secondary | ICD-10-CM | POA: Diagnosis not present

## 2023-02-04 ENCOUNTER — Encounter: Payer: Self-pay | Admitting: Gastroenterology

## 2023-02-04 ENCOUNTER — Ambulatory Visit (INDEPENDENT_AMBULATORY_CARE_PROVIDER_SITE_OTHER): Payer: Medicare Other | Admitting: Gastroenterology

## 2023-02-04 VITALS — BP 122/70 | HR 93 | Ht 64.0 in | Wt 129.0 lb

## 2023-02-04 DIAGNOSIS — R1032 Left lower quadrant pain: Secondary | ICD-10-CM

## 2023-02-04 DIAGNOSIS — M5459 Other low back pain: Secondary | ICD-10-CM | POA: Diagnosis not present

## 2023-02-04 DIAGNOSIS — I8510 Secondary esophageal varices without bleeding: Secondary | ICD-10-CM | POA: Diagnosis not present

## 2023-02-04 DIAGNOSIS — K219 Gastro-esophageal reflux disease without esophagitis: Secondary | ICD-10-CM | POA: Diagnosis not present

## 2023-02-04 DIAGNOSIS — M5416 Radiculopathy, lumbar region: Secondary | ICD-10-CM | POA: Diagnosis not present

## 2023-02-04 NOTE — Patient Instructions (Signed)
You have been scheduled for a EGD/flexible sigmoidoscopy. Please follow the written instructions given to you at your visit today.  If you use inhalers (even only as needed), please bring them with you on the day of your procedure.  DO NOT TAKE 7 DAYS PRIOR TO TEST- Trulicity (dulaglutide) Ozempic, Wegovy (semaglutide) Mounjaro (tirzepatide) Bydureon Bcise (exanatide extended release)  DO NOT TAKE 1 DAY PRIOR TO YOUR TEST Rybelsus (semaglutide) Adlyxin (lixisenatide) Victoza (liraglutide) Byetta (exanatide) ___________________________________________________________________________ The [facility] providers would like to encourage you to use [facility] to communicate with providers for non-urgent requests or questions.  Due to long hold times on the telephone, sending your provider a message by [facility] may be a faster and more efficient way to get a response.  Please allow 48 business hours for a response.  Please remember that this is for non-urgent requests.   Due to recent changes in healthcare laws, you may see the results of your imaging and laboratory studies on MyChart before your provider has had a chance to review them.  We understand that in some cases there may be results that are confusing or concerning to you. Not all laboratory results come back in the same time frame and the provider may be waiting for multiple results in order to interpret others.  Please give [place] 48 hours in order for your provider to thoroughly review all the results before contacting the office for clarification of your results.   Thank you for choosing me and  Gastroenterology.  Venita Lick. Pleas Koch., MD., Clementeen Graham

## 2023-02-04 NOTE — Progress Notes (Signed)
Assessment    LLQ abdominal pain / L groin pain - due to lumbar radiculopathy. Unlikely GI related.  Compensated MASH cirrhosis followed by [facility] Grade I esophageal varices without bleeding due for surveillance Cholelithiasis, asymptomatic Mild constipation Personal history of adenomatous colon polyps, FHCC GERD, small hiatal hernia DM   Recommendations   Schedule EGD and Flex sigmoidoscopy. The risks (including bleeding, perforation, infection, missed lesions, medication reactions and possible hospitalization or surgery if complications occur), benefits, and alternatives to endoscopy with possible biopsy and possible dilation were discussed with the patient and they consent to proceed.  The risks (including bleeding, perforation, infection, missed lesions, medication reactions and possible hospitalization or surgery if complications occur), benefits, and alternatives to sigmoidoscopy with possible biopsy and possible polypectomy were discussed with the patient and they consent to proceed.   Considered trial of Robinul 1 mg po bid - she declined and would consider post EGD/Flex sig Continue Nexium 20 mg three times per week, follow antireflux measures Miralax qd prn constipation   HPI   Chief complaint: LLQ pain / L groin pain, esophageal varices  Patient profile:  Heather Townsend is a 77 y.o. female referred by Shelva Majestic, MD. with left lower quadrant, left groin pain associated with left lower back pain and left thigh pain for several months.  She has mild constipation and notes a slight improvement in pain with bowel movements however the pain is constant and generally not significantly improved with bowel movements.  See recent imaging studies including CT AP, RUQ [place] and lumbar spine MRI. She has been treated with a lumbar spine injection, physical therapy and pain control however her left back, left groin, left lower quadrant and left thigh pain persist.  She notes  temporary improvement in symptoms with a heating pad and hydrocodone. Denies weight loss,  diarrhea, change in stool caliber, melena, hematochezia, nausea, vomiting, dysphagia, reflux symptoms, chest pain.    Previous Labs / Imaging::    Latest Ref Rng & Units 11/29/2022    3:56 PM 09/17/2022    4:08 PM 08/30/2022    6:22 AM  CBC  WBC 4.0 - 10.5 K/uL 4.8  5.2  5.3   Hemoglobin 12.0 - 15.0 g/dL 64.3  32.9  51.8   Hematocrit 36.0 - 46.0 % 43.0  45.9  41.9   Platelets 150 - 400 K/uL 118  119.0  97     Lab Results  Component Value Date   LIPASE 39 11/29/2022      Latest Ref Rng & Units 12/09/2022    1:21 PM 11/29/2022    3:56 PM 09/17/2022    4:08 PM  CMP  Glucose 70 - 99 mg/dL 841  660  630   BUN 6 - 23 mg/dL 18  13  12    Creatinine 0.40 - 1.20 mg/dL 1.60  1.09  3.23   Sodium 135 - 145 mEq/L 135  136  135   Potassium 3.5 - 5.1 mEq/L 3.9  4.0  3.6   Chloride 96 - 112 mEq/L 98  103  97   CO2 19 - 32 mEq/L 29  25  31    Calcium 8.4 - 10.5 mg/dL 9.8  9.0  9.2   Total Protein 6.0 - 8.3 g/dL 7.9  7.5  7.5   Total Bilirubin 0.2 - 1.2 mg/dL 0.9  0.7  0.7   Alkaline Phos 39 - 117 U/L 104  77  89   AST 0 - 37 U/L 42  61  49   ALT 0 - 35 U/L 47  46  36      Previous GI evaluation    Endoscopies:  EGD Sept 2020 - Benign-appearing esophageal stenosis.  - Grade I esophageal varices.  - Small hiatal hernia.  - Gastritis. Biopsied.  - Normal duodenal bulb and second portion of the duodenum.  Colonoscopy Sept 2020 - Diverticulosis in the left colon. - Internal hemorrhoids.  - The examination was otherwise normal on direct and retroflexion views.  - No specimens collected.  Imaging:  US Abdomen Limited RUQ (LIVER/GB) CLINICAL DATA:  [name].  EXAM: ULTRASOUND ABDOMEN LIMITED RIGHT UPPER QUADRANT  COMPARISON:  CT abdomen pelvis 11/29/2022  FINDINGS: Gallbladder:  Cholelithiasis. No gallbladder wall thickening or pericholecystic fluid. Negative sonographic Murphy's  sign.  Common bile duct:  Diameter: 3.4 mm  Liver:  Coarsened echogenicity and mildly nodular contour. No focal hepatic lesion. Portal vein is patent on color Doppler imaging with normal direction of blood flow towards the liver.  Other: None.  IMPRESSION: 1. Cholelithiasis without secondary signs of acute cholecystitis. 2. Coarsened echogenicity and mildly nodular contour of the liver. No focal hepatic lesion.  Electronically Signed   By: Annia Belt M.D.   On: 12/21/2022 10:31   CT AP 11/29/2022 1. No CT evidence for acute intra-abdominal or pelvic abnormality. 2. Liver cirrhosis. Borderline splenomegaly. Gastroesophageal varices. 3. Cholelithiasis. 4. 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 5. Fibrotic lung disease at the bases. 6. Aortic atherosclerosis  Lumbar MRI 11/29/2022 Mild lumbar disc and facet degeneration without high-grade stenosis. 2. Mild lateral recess stenosis at L3-4 and L4-5.  Past Medical History:  Diagnosis Date   Allergy    Cirrhosis (HCC)    Diabetes mellitus without complication (HCC)    Diverticulosis    DIVERTICULOSIS, COLON 01/03/2007   Fatty infiltration of liver    GERD (gastroesophageal reflux disease)    Hepatitis, chronic active (HCC)    NASH   Hiatal hernia    HIATAL HERNIA 12/07/2006   Hyperlipidemia    Interstitial lung disease (HCC)    Low back pain    Tuberculosis    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   Past Surgical History:  Procedure Laterality Date   APPENDECTOMY     EYE SURGERY     macular hole   ganglion cysts     left hand   HAND SURGERY  1996   SHOULDER SURGERY  2006   rt shoulder   TONSILLECTOMY     TUBAL LIGATION     UTERINE SUSPENSION     Family History  Problem Relation Age of Onset   Heart disease Mother    Diabetes Mother    Pancreatic cancer Paternal Uncle    Colon cancer Paternal Uncle    Stomach cancer Paternal Uncle    Cancer Maternal Aunt         Stomach   Esophageal cancer Maternal Aunt    Stomach cancer Maternal Aunt    Esophageal cancer Maternal Uncle    Stomach cancer Maternal Uncle    Rectal cancer Neg Hx    Social History   Tobacco Use   Smoking status: Former    Current packs/day: 0.00    Average packs/day: 0.8 packs/day for 15.0 years (11.3 ttl pk-yrs)    Types: Cigarettes    Start date: 06/02/1971    Quit date: 06/01/1986    Years since quitting: 36.7   Smokeless tobacco: Never  Vaping  Use   Vaping status: Never Used  Substance Use Topics   Alcohol use: Not Currently    Alcohol/week: 0.0 standard drinks of alcohol    Comment: rare   Drug use: No   Current Outpatient Medications  Medication Sig Dispense Refill   aspirin 81 MG chewable tablet Chew 81 mg by mouth daily as needed for mild pain.     Cyanocobalamin 1000 MCG/ML KIT Inject 1,000 mg as directed. Pt does self injections twice a month.     diltiazem (CARDIZEM) 30 MG tablet Take 1 tablet (30 mg total) by mouth every 6 (six) hours as needed (palpitations). 120 tablet 11   esomeprazole (NEXIUM) 40 MG packet Take 40 mg by mouth 3 (three) times a week.     metFORMIN (GLUCOPHAGE) 500 MG tablet Take 1 tablet (500 mg total) by mouth 2 (two) times daily with a meal. 180 tablet 3   Multiple Vitamin (MULTIVITAMIN ADULT PO) Take by mouth. "One a day"     Pirfenidone 267 MG TABS Take 3 tablets (801 mg total) by mouth 3 (three) times daily with meals. 810 tablet 1   POTASSIUM PO Take by mouth. Twice a week.     No current facility-administered medications for this visit.   Allergies  Allergen Reactions   Sulfasalazine Anaphylaxis   Celecoxib     REACTION: Swelling   Codeine    Ibuprofen    Meloxicam     REACTION: Swelling   Nsaids    Rofecoxib    Sulfa Antibiotics     Other reaction(s): Unknown   Sulfonamide Derivatives     Review of Systems: All other systems reviewed and negative except where noted in HPI.    Physical Exam    Wt Readings from Last  3 Encounters:  02/04/23 129 lb (58.5 kg)  12/09/22 129 lb 3.2 oz (58.6 kg)  10/27/22 136 lb 6.4 oz (61.9 kg)    Ht 5\' 4"  (1.626 m)   Wt 129 lb (58.5 kg)   BMI 22.14 kg/m  Constitutional:  Generally well appearing female in no acute distress. HEENT: Pupils normal.  Conjunctivae are normal. No scleral icterus. No oral lesions or deformities noted.  Neck: Supple.  Cardiac: Normal rate, regular rhythm without murmurs. Pulmonary/chest: Effort normal and breath sounds normal. No wheezing, rales or rhonchi. Abdominal: Soft, nondistended, minimal left lower quadrant / left pelvic tenderness to deep palpation. Active bowel sounds. No palpable HSM, masses or hernias. Rectal: Not done Extremities: No edema or deformities noted Neurological: Alert and oriented to person, place and time. Psychiatric: Pleasant. Normal mood and affect. Anxious. Skin: Skin is warm and dry. No rashes noted.  Claudette Head, MD   cc:  Referring Provider Shelva Majestic, MD

## 2023-02-08 ENCOUNTER — Other Ambulatory Visit: Payer: Medicare Other | Admitting: Gastroenterology

## 2023-02-09 DIAGNOSIS — M79605 Pain in left leg: Secondary | ICD-10-CM | POA: Diagnosis not present

## 2023-02-09 DIAGNOSIS — M5416 Radiculopathy, lumbar region: Secondary | ICD-10-CM | POA: Diagnosis not present

## 2023-02-09 DIAGNOSIS — E119 Type 2 diabetes mellitus without complications: Secondary | ICD-10-CM | POA: Diagnosis not present

## 2023-02-09 DIAGNOSIS — I87392 Chronic venous hypertension (idiopathic) with other complications of left lower extremity: Secondary | ICD-10-CM | POA: Diagnosis not present

## 2023-02-09 DIAGNOSIS — M79662 Pain in left lower leg: Secondary | ICD-10-CM | POA: Diagnosis not present

## 2023-02-09 DIAGNOSIS — I83892 Varicose veins of left lower extremities with other complications: Secondary | ICD-10-CM | POA: Diagnosis not present

## 2023-02-09 DIAGNOSIS — M5459 Other low back pain: Secondary | ICD-10-CM | POA: Diagnosis not present

## 2023-02-11 DIAGNOSIS — M5416 Radiculopathy, lumbar region: Secondary | ICD-10-CM | POA: Diagnosis not present

## 2023-02-11 DIAGNOSIS — M5459 Other low back pain: Secondary | ICD-10-CM | POA: Diagnosis not present

## 2023-02-15 ENCOUNTER — Ambulatory Visit (AMBULATORY_SURGERY_CENTER): Payer: Medicare Other | Admitting: Gastroenterology

## 2023-02-15 ENCOUNTER — Encounter: Payer: Self-pay | Admitting: Gastroenterology

## 2023-02-15 VITALS — BP 123/78 | HR 78 | Temp 98.9°F | Resp 14 | Ht 64.0 in | Wt 129.0 lb

## 2023-02-15 DIAGNOSIS — I8510 Secondary esophageal varices without bleeding: Secondary | ICD-10-CM

## 2023-02-15 DIAGNOSIS — K7460 Unspecified cirrhosis of liver: Secondary | ICD-10-CM | POA: Diagnosis not present

## 2023-02-15 DIAGNOSIS — K5730 Diverticulosis of large intestine without perforation or abscess without bleeding: Secondary | ICD-10-CM

## 2023-02-15 DIAGNOSIS — R1032 Left lower quadrant pain: Secondary | ICD-10-CM | POA: Diagnosis not present

## 2023-02-15 DIAGNOSIS — I8500 Esophageal varices without bleeding: Secondary | ICD-10-CM

## 2023-02-15 DIAGNOSIS — K766 Portal hypertension: Secondary | ICD-10-CM | POA: Diagnosis not present

## 2023-02-15 MED ORDER — SODIUM CHLORIDE 0.9 % IV SOLN
500.0000 mL | Freq: Once | INTRAVENOUS | Status: DC
Start: 2023-02-15 — End: 2023-02-15

## 2023-02-15 NOTE — Progress Notes (Signed)
See 9/5 H&P no changes

## 2023-02-15 NOTE — Patient Instructions (Signed)
YOU HAD AN ENDOSCOPIC PROCEDURE TODAY AT THE [facility]:   Refer to the procedure report that was given to you for any specific questions about what was found during the examination.  If the procedure report does not answer your questions, please call your gastroenterologist to clarify.  If you requested that your care partner not be given the details of your procedure findings, then the procedure report has been included in a sealed envelope for you to review at your convenience later.  **Handouts given on Diverticulosis and hemorrhoids**  YOU SHOULD EXPECT: Some feelings of bloating in the abdomen. Passage of more gas than usual.  Walking can help get rid of the air that was put into your GI tract during the procedure and reduce the bloating. If you had a lower endoscopy (such as a colonoscopy or flexible sigmoidoscopy) you may notice spotting of blood in your stool or on the toilet paper. If you underwent a bowel prep for your procedure, you may not have a normal bowel movement for a few days.  Please Note:  You might notice some irritation and congestion in your nose or some drainage.  This is from the oxygen used during your procedure.  There is no need for concern and it should clear up in a day or so.  SYMPTOMS TO REPORT IMMEDIATELY:  Following lower endoscopy (colonoscopy or flexible sigmoidoscopy):  Excessive amounts of blood in the stool  Significant tenderness or worsening of abdominal pains  Swelling of the abdomen that is new, acute  Fever of 100F or higher  Following upper endoscopy (EGD)  Vomiting of blood or coffee ground material  New chest pain or pain under the shoulder blades  Painful or persistently difficult swallowing  New shortness of breath  Fever of 100F or higher  Black, tarry-looking stools  For urgent or emergent issues, a gastroenterologist can be reached at any hour by calling [phone]. Do not use MyChart messaging for urgent concerns.     DIET:  We do recommend a small meal at first, but then you may proceed to your regular diet.  Drink plenty of fluids but you should avoid alcoholic beverages for 24 hours.  ACTIVITY:  You should plan to take it easy for the rest of today and you should NOT DRIVE or use heavy machinery until tomorrow (because of the sedation medicines used during the test).    FOLLOW UP: Our staff will call the number listed on your records the next business day following your procedure.  We will call around 7:15- 8:00 am to check on you and address any questions or concerns that you may have regarding the information given to you following your procedure. If we do not reach you, we will leave a message.     If any biopsies were taken you will be contacted by phone or by letter within the next 1-3 weeks.  Please call us at [phone] if you have not heard about the biopsies in 3 weeks.    SIGNATURES/CONFIDENTIALITY: You and/or your care partner have signed paperwork which will be entered into your electronic medical record.  These signatures attest to the fact that that the information above on your After Visit Summary has been reviewed and is understood.  Full responsibility of the confidentiality of this discharge information lies with you and/or your care-partner.

## 2023-02-15 NOTE — Progress Notes (Signed)
To pacu, VSS. Report to [email or web address] 

## 2023-02-15 NOTE — Op Note (Signed)
[facility] Patient Name: Heather Townsend Procedure Date: 02/15/2023 11:50 AM MRN: [id number] Endoscopist: Meryl Dare , MD, [phone] Age: 77 Referring MD:  Date of Birth: 21-Nov-1945 Gender: Female Account #: [id number] Procedure:                Flexible Sigmoidoscopy Indications:              Abdominal pain in the left lower quadrant Medicines:                Monitored Anesthesia Care Procedure:                Pre-Anesthesia Assessment:                           - Prior to the procedure, a History and Physical                            was performed, and patient medications and                            allergies were reviewed. The patient's tolerance of                            previous anesthesia was also reviewed. The risks                            and benefits of the procedure and the sedation                            options and risks were discussed with the patient.                            All questions were answered, and informed consent                            was obtained. Prior Anticoagulants: The patient has                            taken no anticoagulant or antiplatelet agents. ASA                            Grade Assessment: II - A patient with mild systemic                            disease. After reviewing the risks and benefits,                            the patient was deemed in satisfactory condition to                            undergo the procedure.                           After obtaining informed consent, the scope was  passed under direct vision. The Olympus Scope                            [id number] was introduced through the anus and                            advanced to the the descending colon. The flexible                            sigmoidoscopy was accomplished without difficulty.                            The patient tolerated the procedure well. The                            quality of the  bowel preparation was adequate. Scope In: 12:11:05 PM Scope Out: 12:16:55 PM Total Procedure Duration: 0 hours 5 minutes 50 seconds  Findings:                 The perianal and digital rectal examinations were                            normal.                           Multiple medium-mouthed and small-mouthed                            diverticula were found in the left colon. There was                            evidence of an impacted diverticulum. There was no                            evidence of diverticular bleeding.                           External and internal hemorrhoids were found during                            retroflexion. The hemorrhoids were small and Grade                            I (internal hemorrhoids that do not prolapse).                           The exam was otherwise without abnormality. Complications:            No immediate complications. Estimated Blood Loss:     Estimated blood loss: none. Impression:               - Mild diverticulosis in the left colon.                           - Internal and external hemorrhoids.                           -  The examination was otherwise normal.                           - No specimens collected. Recommendation:           - Patient has a contact number available for                            emergencies. The signs and symptoms of potential                            delayed complications were discussed with the                            patient. Return to normal activities tomorrow.                            Written discharge instructions were provided to the                            patient.                           - Resume previous diet plus high fiber.                           - See EGD report for additional instructions. Meryl Dare, MD 02/15/2023 12:30:16 PM This report has been signed electronically.

## 2023-02-15 NOTE — Op Note (Signed)
[facility] Patient Name: Heather Townsend Procedure Date: 02/15/2023 11:53 AM MRN: [id number] Endoscopist: Meryl Dare , MD, [phone] Age: 77 Referring MD:  Date of Birth: Nov 24, 1945 Gender: Female Account #: [id number] Procedure:                Upper GI endoscopy Indications:              Screening procedure Medicines:                Monitored Anesthesia Care Procedure:                Pre-Anesthesia Assessment:                           - Prior to the procedure, a History and Physical                            was performed, and patient medications and                            allergies were reviewed. The patient's tolerance of                            previous anesthesia was also reviewed. The risks                            and benefits of the procedure and the sedation                            options and risks were discussed with the patient.                            All questions were answered, and informed consent                            was obtained. Prior Anticoagulants: The patient has                            taken no anticoagulant or antiplatelet agents. ASA                            Grade Assessment: II - A patient with mild systemic                            disease. After reviewing the risks and benefits,                            the patient was deemed in satisfactory condition to                            undergo the procedure.                           After obtaining informed consent, the endoscope was  passed under direct vision. Throughout the                            procedure, the patient's blood pressure, pulse, and                            oxygen saturations were monitored continuously. The                            Olympus Scope [id number] was introduced through the                            mouth, and advanced to the second part of duodenum.                            The upper GI endoscopy was  accomplished without                            difficulty. The patient tolerated the procedure                            well. Scope In: Scope Out: Findings:                 Grade I varices with no bleeding and no stigmata of                            recent bleeding were found in the distal esophagus.                            They were 4 mm in largest diameter. No red wale                            signs were present.                           The exam of the esophagus was otherwise normal.                           A medium-sized hiatal hernia was present.                           The gastroesophageal flap valve was visualized                            endoscopically and classified as Hill Grade IV (no                            fold, wide open lumen, hiatal hernia present).                           Mild portal hypertensive gastropathy was found in                            the  gastric body and in the gastric antrum.                           The exam of the stomach was otherwise normal.                           The duodenal bulb and second portion of the                            duodenum were normal. Complications:            No immediate complications. Estimated Blood Loss:     Estimated blood loss: none. Impression:               - Grade I esophageal varices with no bleeding and                            no stigmata of recent bleeding.                           - Medium-sized hiatal hernia.                           - Gastroesophageal flap valve classified as Hill                            Grade IV (no fold, wide open lumen, hiatal hernia                            present).                           - Portal hypertensive gastropathy.                           - Normal duodenal bulb and second portion of the                            duodenum.                           - No specimens collected. Recommendation:           - Patient has a contact number available for                             emergencies. The signs and symptoms of potential                            delayed complications were discussed with the                            patient. Return to normal activities tomorrow.                            Written discharge instructions were provided to the  patient.                           - Resume previous diet.                           - Follow antireflux measures.                           - Continue present medications.                           - Consider repeat upper endoscopy in 3 years for                            surveillance. Meryl Dare, MD 02/15/2023 12:25:57 PM This report has been signed electronically.

## 2023-02-16 ENCOUNTER — Telehealth: Payer: Self-pay | Admitting: *Deleted

## 2023-02-16 DIAGNOSIS — M5416 Radiculopathy, lumbar region: Secondary | ICD-10-CM | POA: Diagnosis not present

## 2023-02-16 DIAGNOSIS — M5459 Other low back pain: Secondary | ICD-10-CM | POA: Diagnosis not present

## 2023-02-16 NOTE — Telephone Encounter (Signed)
No answer on  follow up call. Left message.

## 2023-02-18 DIAGNOSIS — M5417 Radiculopathy, lumbosacral region: Secondary | ICD-10-CM | POA: Diagnosis not present

## 2023-02-23 DIAGNOSIS — M5416 Radiculopathy, lumbar region: Secondary | ICD-10-CM | POA: Diagnosis not present

## 2023-02-23 DIAGNOSIS — M5459 Other low back pain: Secondary | ICD-10-CM | POA: Diagnosis not present

## 2023-03-02 DIAGNOSIS — M5416 Radiculopathy, lumbar region: Secondary | ICD-10-CM | POA: Diagnosis not present

## 2023-03-02 DIAGNOSIS — M5459 Other low back pain: Secondary | ICD-10-CM | POA: Diagnosis not present

## 2023-03-09 DIAGNOSIS — M5459 Other low back pain: Secondary | ICD-10-CM | POA: Diagnosis not present

## 2023-03-09 DIAGNOSIS — M5416 Radiculopathy, lumbar region: Secondary | ICD-10-CM | POA: Diagnosis not present

## 2023-03-16 DIAGNOSIS — M5416 Radiculopathy, lumbar region: Secondary | ICD-10-CM | POA: Diagnosis not present

## 2023-03-16 DIAGNOSIS — M5459 Other low back pain: Secondary | ICD-10-CM | POA: Diagnosis not present

## 2023-03-22 ENCOUNTER — Ambulatory Visit
Admission: RE | Admit: 2023-03-22 | Discharge: 2023-03-22 | Disposition: A | Discharge status: Home / Self Care | Payer: Medicare Other | Source: Ambulatory Visit | Attending: Pulmonary Disease | Admitting: Pulmonary Disease

## 2023-03-22 DIAGNOSIS — J8410 Pulmonary fibrosis, unspecified: Secondary | ICD-10-CM

## 2023-03-22 DIAGNOSIS — I700 Atherosclerosis of aorta: Secondary | ICD-10-CM | POA: Diagnosis not present

## 2023-03-22 DIAGNOSIS — J849 Interstitial pulmonary disease, unspecified: Secondary | ICD-10-CM | POA: Diagnosis not present

## 2023-03-26 ENCOUNTER — Ambulatory Visit: Payer: Medicare Other | Admitting: Pulmonary Disease

## 2023-03-30 DIAGNOSIS — M7918 Myalgia, other site: Secondary | ICD-10-CM | POA: Diagnosis not present

## 2023-03-30 DIAGNOSIS — G5603 Carpal tunnel syndrome, bilateral upper limbs: Secondary | ICD-10-CM | POA: Diagnosis not present

## 2023-03-30 DIAGNOSIS — G603 Idiopathic progressive neuropathy: Secondary | ICD-10-CM | POA: Diagnosis not present

## 2023-03-30 DIAGNOSIS — Z79899 Other long term (current) drug therapy: Secondary | ICD-10-CM | POA: Diagnosis not present

## 2023-04-05 DIAGNOSIS — M5416 Radiculopathy, lumbar region: Secondary | ICD-10-CM | POA: Diagnosis not present

## 2023-04-05 DIAGNOSIS — M5459 Other low back pain: Secondary | ICD-10-CM | POA: Diagnosis not present

## 2023-04-13 DIAGNOSIS — M5416 Radiculopathy, lumbar region: Secondary | ICD-10-CM | POA: Diagnosis not present

## 2023-04-13 DIAGNOSIS — M5459 Other low back pain: Secondary | ICD-10-CM | POA: Diagnosis not present

## 2023-04-27 DIAGNOSIS — M5416 Radiculopathy, lumbar region: Secondary | ICD-10-CM | POA: Diagnosis not present

## 2023-04-27 DIAGNOSIS — M5459 Other low back pain: Secondary | ICD-10-CM | POA: Diagnosis not present

## 2023-05-13 DIAGNOSIS — M5416 Radiculopathy, lumbar region: Secondary | ICD-10-CM | POA: Diagnosis not present

## 2023-05-13 DIAGNOSIS — M5459 Other low back pain: Secondary | ICD-10-CM | POA: Diagnosis not present

## 2023-05-20 DIAGNOSIS — M5416 Radiculopathy, lumbar region: Secondary | ICD-10-CM | POA: Diagnosis not present

## 2023-05-20 DIAGNOSIS — M5459 Other low back pain: Secondary | ICD-10-CM | POA: Diagnosis not present

## 2023-06-04 DIAGNOSIS — M5459 Other low back pain: Secondary | ICD-10-CM | POA: Diagnosis not present

## 2023-06-04 DIAGNOSIS — M5416 Radiculopathy, lumbar region: Secondary | ICD-10-CM | POA: Diagnosis not present

## 2023-06-08 DIAGNOSIS — M7918 Myalgia, other site: Secondary | ICD-10-CM | POA: Diagnosis not present

## 2023-06-08 DIAGNOSIS — Z79899 Other long term (current) drug therapy: Secondary | ICD-10-CM | POA: Diagnosis not present

## 2023-06-08 DIAGNOSIS — Z760 Encounter for issue of repeat prescription: Secondary | ICD-10-CM | POA: Diagnosis not present

## 2023-06-08 DIAGNOSIS — M5417 Radiculopathy, lumbosacral region: Secondary | ICD-10-CM | POA: Diagnosis not present

## 2023-06-08 DIAGNOSIS — G603 Idiopathic progressive neuropathy: Secondary | ICD-10-CM | POA: Diagnosis not present

## 2023-06-08 DIAGNOSIS — G5603 Carpal tunnel syndrome, bilateral upper limbs: Secondary | ICD-10-CM | POA: Diagnosis not present

## 2023-06-09 ENCOUNTER — Other Ambulatory Visit: Payer: Self-pay | Admitting: Physician Assistant

## 2023-06-09 DIAGNOSIS — M5417 Radiculopathy, lumbosacral region: Secondary | ICD-10-CM

## 2023-06-16 DIAGNOSIS — M5459 Other low back pain: Secondary | ICD-10-CM | POA: Diagnosis not present

## 2023-06-16 DIAGNOSIS — M5416 Radiculopathy, lumbar region: Secondary | ICD-10-CM | POA: Diagnosis not present

## 2023-06-24 ENCOUNTER — Other Ambulatory Visit: Payer: Self-pay | Admitting: Nurse Practitioner

## 2023-06-24 DIAGNOSIS — K7460 Unspecified cirrhosis of liver: Secondary | ICD-10-CM | POA: Diagnosis not present

## 2023-06-24 DIAGNOSIS — K7581 Nonalcoholic steatohepatitis (NASH): Secondary | ICD-10-CM | POA: Diagnosis not present

## 2023-06-24 DIAGNOSIS — D376 Neoplasm of uncertain behavior of liver, gallbladder and bile ducts: Secondary | ICD-10-CM | POA: Diagnosis not present

## 2023-06-24 DIAGNOSIS — I8510 Secondary esophageal varices without bleeding: Secondary | ICD-10-CM | POA: Diagnosis not present

## 2023-06-27 ENCOUNTER — Ambulatory Visit
Admission: RE | Admit: 2023-06-27 | Discharge: 2023-06-27 | Disposition: A | Discharge status: Home / Self Care | Payer: Medicare Other | Source: Ambulatory Visit | Attending: Physician Assistant | Admitting: Physician Assistant

## 2023-06-27 DIAGNOSIS — M5417 Radiculopathy, lumbosacral region: Secondary | ICD-10-CM | POA: Diagnosis not present

## 2023-06-30 DIAGNOSIS — M5416 Radiculopathy, lumbar region: Secondary | ICD-10-CM | POA: Diagnosis not present

## 2023-06-30 DIAGNOSIS — M5459 Other low back pain: Secondary | ICD-10-CM | POA: Diagnosis not present

## 2023-07-07 ENCOUNTER — Other Ambulatory Visit: Payer: Medicare Other

## 2023-07-14 ENCOUNTER — Ambulatory Visit: Payer: Medicare Other

## 2023-07-14 VITALS — Wt 140.0 lb

## 2023-07-14 DIAGNOSIS — Z0000 Encounter for general adult medical examination without abnormal findings: Secondary | ICD-10-CM

## 2023-07-14 NOTE — Progress Notes (Signed)
Subjective:   Heather Townsend is a 78 y.o. female who presents for Medicare Annual (Subsequent) preventive examination.  Visit Complete: Virtual I connected with  Heather Townsend on 07/14/23 by a audio enabled telemedicine application and verified that I am speaking with the correct person using two identifiers.  Patient Location: Home  Provider Location: Home Office  I discussed the limitations of evaluation and management by telemedicine. The patient expressed understanding and agreed to proceed.  Vital Signs: Because this visit was a virtual/telehealth visit, some criteria may be missing or patient reported. Any vitals not documented were not able to be obtained and vitals that have been documented are patient reported.  Cardiac Risk Factors include: advanced age (>60men, >48 women);diabetes mellitus;dyslipidemia;hypertension     Objective:    Today's Vitals   07/14/23 0929  Weight: 140 lb (63.5 kg)   Body mass index is 24.03 kg/m.     07/14/2023    9:37 AM 08/30/2022    4:43 AM 07/21/2021   11:42 AM 08/12/2020    4:15 PM 07/15/2020   11:14 AM 01/13/2019    5:50 PM 08/13/2018    8:40 PM  Advanced Directives  Does Patient Have a Medical Advance Directive? Yes No No No No No No  Type of Estate agent of [place];Living will        Does patient want to make changes to medical advance directive?   No - Patient declined      Copy of Healthcare Power of Attorney in Chart? No - copy requested        Would patient like information on creating a medical advance directive?    No - Patient declined No - Patient declined      Current Medications (verified) Outpatient Encounter Medications as of 07/14/2023  Medication Sig   aspirin 81 MG chewable tablet Chew 81 mg by mouth daily as needed for mild pain.   Cyanocobalamin 1000 MCG/ML KIT Inject 1,000 mg as directed. Pt does self injections twice a month.   esomeprazole (NEXIUM) 40 MG packet Take 40 mg by mouth 3  (three) times a week.   HYDROcodone-acetaminophen (NORCO/VICODIN) 5-325 MG tablet Take 1 tablet by mouth every 4 (four) hours as needed.   metFORMIN (GLUCOPHAGE) 500 MG tablet Take 1 tablet (500 mg total) by mouth 2 (two) times daily with a meal.   Multiple Vitamin (MULTIVITAMIN ADULT PO) Take by mouth. "One a day"   POTASSIUM PO Take by mouth. Twice a week.   [DISCONTINUED] diltiazem (CARDIZEM) 30 MG tablet Take 1 tablet (30 mg total) by mouth every 6 (six) hours as needed (palpitations). (Patient not taking: Reported on 02/15/2023)   [DISCONTINUED] gabapentin (NEURONTIN) 300 MG capsule Gabapentin 300 mg   [DISCONTINUED] Pirfenidone 267 MG TABS Take 3 tablets (801 mg total) by mouth 3 (three) times daily with meals. (Patient not taking: Reported on 02/04/2023)   No facility-administered encounter medications on file as of 07/14/2023.    Allergies (verified) Sulfasalazine, Celecoxib, Codeine, Ibuprofen, Meloxicam, Nsaids, Rofecoxib, Sulfa antibiotics, and Sulfonamide derivatives   History: Past Medical History:  Diagnosis Date   Allergy    Cirrhosis (HCC)    Diabetes mellitus without complication (HCC)    Diverticulosis    DIVERTICULOSIS, COLON 01/03/2007   Fatty infiltration of liver    GERD (gastroesophageal reflux disease)    Hepatitis, chronic active (HCC)    NASH   Hiatal hernia    HIATAL HERNIA 12/07/2006   Hyperlipidemia  Interstitial lung disease (HCC)    Kidney stone on left side 08/31/2022   Low back pain    Tuberculosis    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   Past Surgical History:  Procedure Laterality Date   APPENDECTOMY     EYE SURGERY     macular hole   ganglion cysts     left hand   HAND SURGERY  1996   SHOULDER SURGERY  2006   rt shoulder   TONSILLECTOMY     TUBAL LIGATION     UTERINE SUSPENSION     Family History  Problem Relation Age of Onset   Heart disease Mother    Diabetes Mother    Pancreatic cancer Paternal Uncle    Colon cancer  Paternal Uncle    Stomach cancer Paternal Uncle    Cancer Maternal Aunt        Stomach   Esophageal cancer Maternal Aunt    Stomach cancer Maternal Aunt    Esophageal cancer Maternal Uncle    Stomach cancer Maternal Uncle    Rectal cancer Neg Hx    Social History   Socioeconomic History   Marital status: Divorced    Spouse name: Not on file   Number of children: Not on file   Years of education: Not on file   Highest education level: Not on file  Occupational History   Occupation: Print production planner   Occupation: care giver  Tobacco Use   Smoking status: Former    Current packs/day: 0.00    Average packs/day: 0.8 packs/day for 15.0 years (11.3 ttl pk-yrs)    Types: Cigarettes    Start date: 06/02/1971    Quit date: 06/01/1986    Years since quitting: 37.1   Smokeless tobacco: Never  Vaping Use   Vaping status: Never Used  Substance and Sexual Activity   Alcohol use: Not Currently    Alcohol/week: 0.0 standard drinks of alcohol    Comment: rare   Drug use: No   Sexual activity: Not Currently  Other Topics Concern   Not on file  Social History Narrative   Divorced. Long term boyfriend passed 2018.  Lives alone. 2 children (son in [place], daughter in [place]). 1 granddaughter in [place].       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 Bookkeeping/purchasing, HR piece.    Social Drivers of Corporate investment banker Strain: Low Risk  (07/14/2023)   Overall Financial Resource Strain (CARDIA)    Difficulty of Paying Living Expenses: Not hard at all  Food Insecurity: No Food Insecurity (07/14/2023)   Hunger Vital Sign    Worried About Running Out of Food in the Last Year: Never true    Ran Out of Food in the Last Year: Never true  Transportation Needs: No Transportation Needs (07/14/2023)   PRAPARE - Administrator, Civil Service (Medical): No    Lack of  Transportation (Non-Medical): No  Physical Activity: Inactive (07/14/2023)   Exercise Vital Sign    Days of Exercise per Week: 0 days    Minutes of Exercise per Session: 0 min  Stress: No Stress Concern Present (07/14/2023)   [employer] of Occupational Health - Occupational Stress Questionnaire    Feeling of Stress : Not at all  Social Connections: Moderately Integrated (07/14/2023)   Social Connection and Isolation Panel [NHANES]  Frequency of Communication with Friends and Family: More than three times a week    Frequency of Social Gatherings with Friends and Family: More than three times a week    Attends Religious Services: More than 4 times per year    Active Member of [employer] or Organizations: Yes    Attends Banker Meetings: 1 to 4 times per year    Marital Status: Divorced    Tobacco Counseling Counseling given: Not Answered   Clinical Intake:  Pre-visit preparation completed: Yes  Pain : No/denies pain     BMI - recorded: 24.03 Nutritional Status: BMI of 19-24  Normal Nutritional Risks: None Diabetes: Yes CBG done?: No Did pt. bring in CBG monitor from home?: No  How often do you need to have someone help you when you read instructions, pamphlets, or other written materials from your doctor or pharmacy?: 1 - Never  Interpreter Needed?: No  Information entered by :: Lanier Ensign, LPN   Activities of Daily Living    07/14/2023    9:31 AM  In your present state of health, do you have any difficulty performing the following activities:  Hearing? 0  Vision? 0  Difficulty concentrating or making decisions? 0  Walking or climbing stairs? 0  Dressing or bathing? 0  Doing errands, shopping? 0  Preparing Food and eating ? N  Using the Toilet? N  In the past six months, have you accidently leaked urine? N  Do you have problems with loss of bowel control? N  Managing your Medications? N  Managing your Finances? N  Housekeeping or managing  your Housekeeping? N    Patient Care Team: Shelva Majestic, MD as PCP - General (Family Medicine) Christell Constant, MD as PCP - Cardiology (Cardiology)  Indicate any recent Medical Services you may have received from other than [facility] providers in the past year (date may be approximate).     Assessment:   This is a routine wellness examination for Lueders.  Hearing/Vision screen Hearing Screening - Comments:: Pt denies any hearing issues  Vision Screening - Comments:: Pt follows up with [facility]    Goals Addressed             This Visit's Progress    Patient Stated       Aim for 30 minutes of exercise or brisk walking, 6-8 glasses of water, and 5 servings of fruits and vegetables each day.        Depression Screen    07/14/2023    9:35 AM 09/17/2022    3:12 PM 07/21/2021   11:41 AM 07/15/2020   11:12 AM 06/07/2020    9:56 AM 04/11/2019    4:30 PM 02/14/2019    4:04 PM  PHQ 2/9 Scores  PHQ - 2 Score 0 0 0 0 0 0 0  PHQ- 9 Score     1      Fall Risk    07/14/2023    9:38 AM 07/21/2021   11:43 AM 07/15/2020   11:15 AM 06/07/2020    9:55 AM 02/14/2019    4:04 PM  Fall Risk   Falls in the past year? 1 0 1 0 0  Number falls in past yr: 1 0 1 0 0  Injury with Fall? 0 0 1 0 0  Comment   foot injury    Risk for fall due to : History of fall(s) Impaired vision Impaired mobility;Impaired balance/gait;Impaired vision    Risk for fall due  to: Comment due to medication per pt  foot injury    Follow up Falls prevention discussed Falls prevention discussed       MEDICARE RISK AT HOME: Medicare Risk at Home Any stairs in or around the home?: Yes If so, are there any without handrails?: No Home free of loose throw rugs in walkways, pet beds, electrical cords, etc?: Yes Adequate lighting in your home to reduce risk of falls?: Yes Life alert?: No Use of a cane, walker or w/c?: No Grab bars in the bathroom?: No Shower chair or bench in shower?: No Elevated toilet seat  or a handicapped toilet?: No  TIMED UP AND GO:  Was the test performed?  No    Cognitive Function:        07/14/2023    9:39 AM 07/21/2021   11:51 AM 07/15/2020   11:19 AM  6CIT Screen  What Year? 0 points 0 points 0 points  What month? 0 points 0 points 0 points  What time? 0 points 0 points   Count back from 20 0 points 0 points 0 points  Months in reverse 0 points 0 points 0 points  Repeat phrase 0 points 0 points 0 points  Total Score 0 points 0 points     Immunizations Immunization History  Administered Date(s) Administered   Td 07/31/2006    TDAP status: Due, Education has been provided regarding the importance of this vaccine. Advised may receive this vaccine at local pharmacy or Health Dept. Aware to provide a copy of the vaccination record if obtained from local pharmacy or Health Dept. Verbalized acceptance and understanding.  Flu Vaccine status: Declined, Education has been provided regarding the importance of this vaccine but patient still declined. Advised may receive this vaccine at local pharmacy or Health Dept. Aware to provide a copy of the vaccination record if obtained from local pharmacy or Health Dept. Verbalized acceptance and understanding.  Pneumococcal vaccine status: Declined,  Education has been provided regarding the importance of this vaccine but patient still declined. Advised may receive this vaccine at local pharmacy or Health Dept. Aware to provide a copy of the vaccination record if obtained from local pharmacy or Health Dept. Verbalized acceptance and understanding.   Covid-19 vaccine status: Declined, Education has been provided regarding the importance of this vaccine but patient still declined. Advised may receive this vaccine at local pharmacy or Health Dept.or vaccine clinic. Aware to provide a copy of the vaccination record if obtained from local pharmacy or Health Dept. Verbalized acceptance and understanding.  Qualifies for Shingles  Vaccine? No    Screening Tests Health Maintenance  Topic Date Due   DTaP/Tdap/Td (2 - Tdap) 07/30/2016   OPHTHALMOLOGY EXAM  06/14/2019   Colonoscopy  02/27/2022   Diabetic kidney evaluation - Urine ACR  03/03/2023   FOOT EXAM  03/03/2023   HEMOGLOBIN A1C  03/19/2023   Diabetic kidney evaluation - eGFR measurement  12/09/2023   Medicare Annual Wellness (AWV)  07/13/2024   DEXA SCAN  Completed   Hepatitis C Screening  Completed   HPV VACCINES  Aged Out   Pneumonia Vaccine 31+ Years old  Discontinued   INFLUENZA VACCINE  Discontinued   COVID-19 Vaccine  Discontinued   Zoster Vaccines- Shingrix  Discontinued    Health Maintenance  Health Maintenance Due  Topic Date Due   DTaP/Tdap/Td (2 - Tdap) 07/30/2016   OPHTHALMOLOGY EXAM  06/14/2019   Colonoscopy  02/27/2022   Diabetic kidney evaluation - Urine ACR  03/03/2023  FOOT EXAM  03/03/2023   HEMOGLOBIN A1C  03/19/2023    Colorectal cancer screening: Type of screening: Sigmoidoscopy. Completed 02/15/23. Repeat every as directed per  years  Mammogram status: Completed 06/28/14. Repeat every year  Bone Density status: Completed 08/22/14. Results reflect: Bone density results: OSTEOPOROSIS. Repeat every 2 years. Additional Screening:  Hepatitis C Screening:  Completed 08/30/06  Vision Screening: Recommended annual ophthalmology exams for early detection of glaucoma and other disorders of the eye. Is the patient up to date with their annual eye exam?  Yes  Who is the provider or what is the name of the office in which the patient attends annual eye exams? [facility]  If pt is not established with a provider, would they like to be referred to a provider to establish care? No .   Dental Screening: Recommended annual dental exams for proper oral hygiene  Diabetic Foot Exam: Diabetic Foot Exam: Overdue, Pt has been advised about the importance in completing this exam. Pt is scheduled for diabetic foot exam on next appt  .  Community Resource Referral / Chronic Care Management: CRR required this visit?  No   CCM required this visit?  No     Plan:     I have personally reviewed and noted the following in the patient's chart:   Medical and social history Use of alcohol, tobacco or illicit drugs  Current medications and supplements including opioid prescriptions. Patient is currently taking opioid prescriptions. Information provided to patient regarding non-opioid alternatives. Patient advised to discuss non-opioid treatment plan with their provider. Functional ability and status Nutritional status Physical activity Advanced directives List of other physicians Hospitalizations, surgeries, and ER visits in previous 12 months Vitals Screenings to include cognitive, depression, and falls Referrals and appointments  In addition, I have reviewed and discussed with patient certain preventive protocols, quality metrics, and best practice recommendations. A written personalized care plan for preventive services as well as general preventive health recommendations were provided to patient.     Marzella Schlein, LPN   1/61/0960   After Visit Summary: (Declined) Due to this being a telephonic visit, with patients personalized plan was offered to patient but patient Declined AVS at this time   Nurse Notes: none

## 2023-07-14 NOTE — Patient Instructions (Signed)
Heather Townsend , Thank you for taking time to come for your Medicare Wellness Visit. I appreciate your ongoing commitment to your health goals. Please review the following plan we discussed and let me know if I can assist you in the future.   Referrals/Orders/Follow-Ups/Clinician Recommendations: Aim for 30 minutes of exercise or brisk walking, 6-8 glasses of water, and 5 servings of fruits and vegetables each day.   This is a list of the screening recommended for you and due dates:  Health Maintenance  Topic Date Due   DTaP/Tdap/Td vaccine (2 - Tdap) 07/30/2016   Eye exam for diabetics  06/14/2019   Colon Cancer Screening  02/27/2022   Yearly kidney health urinalysis for diabetes  03/03/2023   Complete foot exam   03/03/2023   Hemoglobin A1C  03/19/2023   Yearly kidney function blood test for diabetes  12/09/2023   Medicare Annual Wellness Visit  07/13/2024   DEXA scan (bone density measurement)  Completed   Hepatitis C Screening  Completed   HPV Vaccine  Aged Out   Pneumonia Vaccine  Discontinued   Flu Shot  Discontinued   COVID-19 Vaccine  Discontinued   Zoster (Shingles) Vaccine  Discontinued    Advanced directives: (Copy Requested) Please bring a copy of your health care power of attorney and living will to the office to be added to your chart at your convenience.  Next Medicare Annual Wellness Visit scheduled for next year: Yes

## 2023-07-20 DIAGNOSIS — M5417 Radiculopathy, lumbosacral region: Secondary | ICD-10-CM | POA: Diagnosis not present

## 2023-07-20 DIAGNOSIS — M47897 Other spondylosis, lumbosacral region: Secondary | ICD-10-CM | POA: Diagnosis not present

## 2023-07-20 DIAGNOSIS — G894 Chronic pain syndrome: Secondary | ICD-10-CM | POA: Diagnosis not present

## 2023-07-20 DIAGNOSIS — G5603 Carpal tunnel syndrome, bilateral upper limbs: Secondary | ICD-10-CM | POA: Diagnosis not present

## 2023-07-20 DIAGNOSIS — G603 Idiopathic progressive neuropathy: Secondary | ICD-10-CM | POA: Diagnosis not present

## 2023-07-21 ENCOUNTER — Other Ambulatory Visit: Payer: Medicare Other

## 2023-07-26 DIAGNOSIS — M5416 Radiculopathy, lumbar region: Secondary | ICD-10-CM | POA: Diagnosis not present

## 2023-07-26 DIAGNOSIS — M5459 Other low back pain: Secondary | ICD-10-CM | POA: Diagnosis not present

## 2023-07-28 ENCOUNTER — Ambulatory Visit
Admission: RE | Admit: 2023-07-28 | Discharge: 2023-07-28 | Disposition: A | Discharge status: Home / Self Care | Payer: Medicare Other | Source: Ambulatory Visit | Attending: Nurse Practitioner | Admitting: Nurse Practitioner

## 2023-07-28 DIAGNOSIS — D376 Neoplasm of uncertain behavior of liver, gallbladder and bile ducts: Secondary | ICD-10-CM

## 2023-07-28 DIAGNOSIS — K7460 Unspecified cirrhosis of liver: Secondary | ICD-10-CM

## 2023-07-29 ENCOUNTER — Other Ambulatory Visit: Payer: Self-pay | Admitting: Nurse Practitioner

## 2023-07-29 DIAGNOSIS — D376 Neoplasm of uncertain behavior of liver, gallbladder and bile ducts: Secondary | ICD-10-CM

## 2023-08-13 DIAGNOSIS — M5459 Other low back pain: Secondary | ICD-10-CM | POA: Diagnosis not present

## 2023-08-13 DIAGNOSIS — M5416 Radiculopathy, lumbar region: Secondary | ICD-10-CM | POA: Diagnosis not present

## 2023-08-19 DIAGNOSIS — M5416 Radiculopathy, lumbar region: Secondary | ICD-10-CM | POA: Diagnosis not present

## 2023-08-19 DIAGNOSIS — M5459 Other low back pain: Secondary | ICD-10-CM | POA: Diagnosis not present

## 2023-08-29 ENCOUNTER — Ambulatory Visit
Admission: RE | Admit: 2023-08-29 | Discharge: 2023-08-29 | Disposition: A | Discharge status: Home / Self Care | Payer: Medicare Other | Source: Ambulatory Visit | Attending: Nurse Practitioner | Admitting: Nurse Practitioner

## 2023-08-29 DIAGNOSIS — K7460 Unspecified cirrhosis of liver: Secondary | ICD-10-CM | POA: Diagnosis not present

## 2023-08-29 DIAGNOSIS — K8020 Calculus of gallbladder without cholecystitis without obstruction: Secondary | ICD-10-CM | POA: Diagnosis not present

## 2023-08-29 DIAGNOSIS — D376 Neoplasm of uncertain behavior of liver, gallbladder and bile ducts: Secondary | ICD-10-CM

## 2023-08-29 MED ORDER — GADOPICLENOL 0.5 MMOL/ML IV SOLN
6.0000 mL | Freq: Once | INTRAVENOUS | Status: AC | PRN
  Administered 2023-08-29: 6 mL via INTRAVENOUS

## 2023-08-31 DIAGNOSIS — M791 Myalgia, unspecified site: Secondary | ICD-10-CM | POA: Diagnosis not present

## 2023-08-31 DIAGNOSIS — E559 Vitamin D deficiency, unspecified: Secondary | ICD-10-CM | POA: Diagnosis not present

## 2023-08-31 DIAGNOSIS — R202 Paresthesia of skin: Secondary | ICD-10-CM | POA: Diagnosis not present

## 2023-08-31 DIAGNOSIS — G5603 Carpal tunnel syndrome, bilateral upper limbs: Secondary | ICD-10-CM | POA: Diagnosis not present

## 2023-08-31 DIAGNOSIS — M6281 Muscle weakness (generalized): Secondary | ICD-10-CM | POA: Diagnosis not present

## 2023-08-31 DIAGNOSIS — M5412 Radiculopathy, cervical region: Secondary | ICD-10-CM | POA: Diagnosis not present

## 2023-08-31 DIAGNOSIS — M5417 Radiculopathy, lumbosacral region: Secondary | ICD-10-CM | POA: Diagnosis not present

## 2023-08-31 DIAGNOSIS — G603 Idiopathic progressive neuropathy: Secondary | ICD-10-CM | POA: Diagnosis not present

## 2023-09-01 DIAGNOSIS — M5459 Other low back pain: Secondary | ICD-10-CM | POA: Diagnosis not present

## 2023-09-01 DIAGNOSIS — M5416 Radiculopathy, lumbar region: Secondary | ICD-10-CM | POA: Diagnosis not present

## 2023-09-07 ENCOUNTER — Ambulatory Visit
Admission: RE | Admit: 2023-09-07 | Discharge: 2023-09-07 | Disposition: A | Discharge status: Home / Self Care | Source: Ambulatory Visit | Attending: Specialist | Admitting: Specialist

## 2023-09-07 ENCOUNTER — Other Ambulatory Visit: Payer: Self-pay | Admitting: Specialist

## 2023-09-07 DIAGNOSIS — M25552 Pain in left hip: Secondary | ICD-10-CM | POA: Diagnosis not present

## 2023-09-07 DIAGNOSIS — M79652 Pain in left thigh: Secondary | ICD-10-CM

## 2023-09-08 DIAGNOSIS — G894 Chronic pain syndrome: Secondary | ICD-10-CM | POA: Diagnosis not present

## 2023-09-08 DIAGNOSIS — Z79899 Other long term (current) drug therapy: Secondary | ICD-10-CM | POA: Diagnosis not present

## 2023-09-08 DIAGNOSIS — G603 Idiopathic progressive neuropathy: Secondary | ICD-10-CM | POA: Diagnosis not present

## 2023-09-08 DIAGNOSIS — M25551 Pain in right hip: Secondary | ICD-10-CM | POA: Diagnosis not present

## 2023-09-08 DIAGNOSIS — M5417 Radiculopathy, lumbosacral region: Secondary | ICD-10-CM | POA: Diagnosis not present

## 2023-09-08 DIAGNOSIS — M25552 Pain in left hip: Secondary | ICD-10-CM | POA: Diagnosis not present

## 2023-09-08 DIAGNOSIS — M461 Sacroiliitis, not elsewhere classified: Secondary | ICD-10-CM | POA: Diagnosis not present

## 2023-09-10 DIAGNOSIS — M5459 Other low back pain: Secondary | ICD-10-CM | POA: Diagnosis not present

## 2023-09-10 DIAGNOSIS — M5416 Radiculopathy, lumbar region: Secondary | ICD-10-CM | POA: Diagnosis not present

## 2023-09-15 DIAGNOSIS — M5416 Radiculopathy, lumbar region: Secondary | ICD-10-CM | POA: Diagnosis not present

## 2023-09-15 DIAGNOSIS — M5459 Other low back pain: Secondary | ICD-10-CM | POA: Diagnosis not present

## 2023-09-17 ENCOUNTER — Ambulatory Visit
Admission: RE | Admit: 2023-09-17 | Discharge: 2023-09-17 | Disposition: A | Discharge status: Home / Self Care | Source: Ambulatory Visit | Attending: Pain Medicine | Admitting: Pain Medicine

## 2023-09-17 ENCOUNTER — Other Ambulatory Visit: Payer: Self-pay | Admitting: Pain Medicine

## 2023-09-17 ENCOUNTER — Encounter: Payer: Self-pay | Admitting: Pain Medicine

## 2023-09-17 DIAGNOSIS — M25551 Pain in right hip: Secondary | ICD-10-CM | POA: Diagnosis not present

## 2023-09-17 DIAGNOSIS — M25552 Pain in left hip: Secondary | ICD-10-CM

## 2023-09-17 DIAGNOSIS — R200 Anesthesia of skin: Secondary | ICD-10-CM | POA: Diagnosis not present

## 2023-09-21 DIAGNOSIS — M5416 Radiculopathy, lumbar region: Secondary | ICD-10-CM | POA: Diagnosis not present

## 2023-09-21 DIAGNOSIS — M5459 Other low back pain: Secondary | ICD-10-CM | POA: Diagnosis not present

## 2023-10-08 ENCOUNTER — Emergency Department (HOSPITAL_BASED_OUTPATIENT_CLINIC_OR_DEPARTMENT_OTHER)

## 2023-10-08 ENCOUNTER — Emergency Department (HOSPITAL_BASED_OUTPATIENT_CLINIC_OR_DEPARTMENT_OTHER): Admission: EM | Admit: 2023-10-08 | Discharge: 2023-10-08 | Disposition: A | Discharge status: Home / Self Care

## 2023-10-08 ENCOUNTER — Encounter (HOSPITAL_BASED_OUTPATIENT_CLINIC_OR_DEPARTMENT_OTHER): Payer: Self-pay

## 2023-10-08 ENCOUNTER — Other Ambulatory Visit: Payer: Self-pay

## 2023-10-08 DIAGNOSIS — I6523 Occlusion and stenosis of bilateral carotid arteries: Secondary | ICD-10-CM | POA: Diagnosis not present

## 2023-10-08 DIAGNOSIS — S0993XA Unspecified injury of face, initial encounter: Secondary | ICD-10-CM | POA: Diagnosis present

## 2023-10-08 DIAGNOSIS — Z23 Encounter for immunization: Secondary | ICD-10-CM | POA: Diagnosis not present

## 2023-10-08 DIAGNOSIS — S0101XA Laceration without foreign body of scalp, initial encounter: Secondary | ICD-10-CM | POA: Diagnosis not present

## 2023-10-08 DIAGNOSIS — W01198A Fall on same level from slipping, tripping and stumbling with subsequent striking against other object, initial encounter: Secondary | ICD-10-CM | POA: Diagnosis not present

## 2023-10-08 DIAGNOSIS — S0990XA Unspecified injury of head, initial encounter: Secondary | ICD-10-CM | POA: Diagnosis not present

## 2023-10-08 MED ORDER — TETANUS-DIPHTH-ACELL PERTUSSIS 5-2.5-18.5 LF-MCG/0.5 IM SUSY
0.5000 mL | PREFILLED_SYRINGE | Freq: Once | INTRAMUSCULAR | Status: AC
  Administered 2023-10-08: 0.5 mL via INTRAMUSCULAR
  Filled 2023-10-08: qty 0.5

## 2023-10-08 NOTE — Discharge Instructions (Addendum)
 As discussed you need to return in 7 to 10 days to have your staples removed.  You may shower and wash your hair normal.  Do not scrub.  Do not submerge yourself underwater.  Return if develop fevers, chills, wounds begin draining pus, lightheadedness, passout, unilateral weakness, difficulty finding words, numbness, seizures, chest pain, shortness of breath or any new or worsening symptoms that are concerning to you.  You may take over-the-counter Tylenol  or ibuprofen for pain.

## 2023-10-08 NOTE — ED Notes (Signed)
 Patient given discharge instructions. Questions were answered. Patient verbalized understanding of discharge instructions and care at home.

## 2023-10-08 NOTE — ED Triage Notes (Signed)
 Patient arrives ambulatory to the ED with complaints of having a mechanical fall overnight. Patient tripped and fell backwards, hitting her head. No LOC or blood thinners.

## 2023-10-08 NOTE — ED Notes (Signed)
 Patient transported to CT

## 2023-10-08 NOTE — ED Provider Notes (Signed)
 [place] [facility] Provider Note   CSN: [id number] Arrival date & time: 10/08/23  1045     History  Chief Complaint  Patient presents with   Fall   Head Injury    Heather Townsend is a 78 y.o. female.  78 year old female presenting emergency department after falling last night while trying to ambulate to the bathroom.  She fell backwards, struck the posterior aspect of her head.  No LOC.  Does not take blood thinners.  Complains of some minor pain to left scapula, but no numbness tingling changes in sensation, no other pain.  Reports head wound has continued to ooze this morning.   Fall  Head Injury      Home Medications Prior to Admission medications   Medication Sig Start Date End Date Taking? Authorizing Provider  aspirin 81 MG chewable tablet Chew 81 mg by mouth daily as needed for mild pain.    [provider]  Cyanocobalamin  1000 MCG/ML KIT Inject 1,000 mg as directed. Pt does self injections twice a month.    [provider]  esomeprazole (NEXIUM) 40 MG packet Take 40 mg by mouth 3 (three) times a week.    [provider]  HYDROcodone -acetaminophen  (NORCO/VICODIN) 5-325 MG tablet Take 1 tablet by mouth every 4 (four) hours as needed. 10/02/22   [provider]  metFORMIN  (GLUCOPHAGE ) 500 MG tablet Take 1 tablet (500 mg total) by mouth 2 (two) times daily with a meal. 09/17/22   Almira Jaeger, MD  Multiple Vitamin (MULTIVITAMIN ADULT PO) Take by mouth. "One a day"    [provider]  POTASSIUM PO Take by mouth. Twice a week.    [provider]      Allergies    Sulfasalazine, Celecoxib, Codeine, Ibuprofen, Meloxicam, Nsaids, Rofecoxib, Sulfa antibiotics, and Sulfonamide derivatives    Review of Systems   Review of Systems  Physical Exam Updated Vital Signs BP (!) 166/78 (BP Location: Right Arm)   Pulse 90   Temp 97.7 F (36.5 C) (Oral)   Resp 20   Ht 5\' 4"  (1.626 m)    Wt 61.2 kg   SpO2 100%   BMI 23.17 kg/m  Physical Exam Vitals and nursing note reviewed.  HENT:     Head: Normocephalic.     Comments: 2 to 3 cm laceration to the posterior occiput, minor venous type oozing. Eyes:     Conjunctiva/sclera: Conjunctivae normal.  Cardiovascular:     Rate and Rhythm: Normal rate and regular rhythm.  Pulmonary:     Effort: Pulmonary effort is normal.     Breath sounds: Normal breath sounds.  Abdominal:     General: Abdomen is flat.     Palpations: Abdomen is soft.  Musculoskeletal:     Comments: No midline spinal tenderness.  Minor tenderness over the scapular body.  Skin:    General: Skin is warm.     Capillary Refill: Capillary refill takes less than 2 seconds.  Neurological:     Mental Status: She is alert and oriented to person, place, and time.  Psychiatric:        Mood and Affect: Mood normal.        Behavior: Behavior normal.     ED Results / Procedures / Treatments   Labs (all labs ordered are listed, but only abnormal results are displayed) Labs Reviewed - No data to display  EKG None  Radiology CT Head Wo Contrast Result Date: 10/08/2023 CLINICAL  DATA:  Head trauma, minor (Age >= 65y), mechanical fall EXAM: CT HEAD WITHOUT CONTRAST TECHNIQUE: Contiguous axial images were obtained from the base of the skull through the vertex without intravenous contrast. RADIATION DOSE REDUCTION: This exam was performed according to the departmental dose-optimization program which includes automated exposure control, adjustment of the mA and/or kV according to patient size and/or use of iterative reconstruction technique. COMPARISON:  November 29, 2022 FINDINGS: Brain: The ventricles appear age appropriate. No mass effect or midline shift. Gray-white differentiation is preserved without focal attenuation abnormality.No evidence of acute territorial infarction, extra-axial fluid collection, hemorrhage, or mass lesion. The basilar cisterns are patent without  downward herniation. The cerebellar hemispheres and vermis are well formed without mass lesion or focal attenuation abnormality. Vascular: No hyperdense vessel. Calcified atherosclerotic plaque within the cavernous/supraclinoid internal carotid arteries. Skull: Multiple small locules of gas within the right occipital scalp with subcutaneous edema, likely due to overlying laceration. Negative for fracture or focal lesion. Sinuses/Orbits: The paranasal sinuses and mastoids are clear. The globes appear intact. No retrobulbar hematoma. Other: None. IMPRESSION: 1. Multiple small locules of gas within the right occipital scalp with subcutaneous edema, likely due to overlying laceration. No underlying calvarial fracture. 2. No acute intracranial abnormality, specifically, no acute hemorrhage, territorial infarction, or intracranial mass. Electronically Signed   By: Rance Burrows M.D.   On: 10/08/2023 12:41    Procedures .Laceration Repair  Date/Time: 10/08/2023 3:00 PM  Performed by: Rolinda Climes, DO Authorized by: Rolinda Climes, DO   Consent:    Consent obtained:  Verbal   Consent given by:  Patient   Risks discussed:  Infection, pain, poor cosmetic result, retained foreign body, vascular damage and poor wound healing   Alternatives discussed:  No treatment Universal protocol:    Procedure explained and questions answered to patient or proxy's satisfaction: yes     Patient identity confirmed:  Verbally with patient Anesthesia:    Anesthesia method:  None Laceration details:    Location:  Scalp   Scalp location:  Occipital   Length (cm):  3 Pre-procedure details:    Preparation:  Patient was prepped and draped in usual sterile fashion Exploration:    Wound extent: no foreign body   Treatment:    Area cleansed with:  Saline   Amount of cleaning:  Standard Skin repair:    Repair method:  Staples   Number of staples:  9 Approximation:    Approximation:  Close Repair type:    Repair  type:  Simple Post-procedure details:    Dressing:  Open (no dressing)   Procedure completion:  Tolerated     Medications Ordered in ED Medications  Tdap (BOOSTRIX) injection 0.5 mL (0.5 mLs Intramuscular Given 10/08/23 1140)    ED Course/ Medical Decision Making/ A&P Clinical Course as of 10/08/23 1502  Fri Oct 08, 2023  1131 CT Head Wo Contrast No obvious intracranial hemorrhage on my independent interpretation [TY]    Clinical Course User Index [TY] Rolinda Climes, DO                                 Medical Decision Making Well-appearing 78 year old female presents emergency department for evaluation of a fall with laceration to her posterior occiput.  She is afebrile vital signs reassuring.  Neurovascularly intact.  No other obvious injuries on exam.  He has good strength in bilateral upper extremities.  Some minor tenderness  to the left scapula, suspect contusion from falling, low suspicion for fracture.  CT head to evaluate for intracranial pathology.  Results as noted in ED course.  She is unsure when her last tetanus shot was, will update.  Will require staples.  See procedure note.  Update MDM.  CT head negative. Wound repaired.  Stable for discharge.  Amount and/or Complexity of Data Reviewed External Data Reviewed:     Details: Does not appear to be on blood thinner. Labs:     Details: Considered; however mechanical fall.  Low suspicion for metabolic derangements.  Low suspicion for acute blood loss anemia.  Will forego labs at this time. Radiology: ordered. Decision-making details documented in ED Course.    Details: Not indicated based on [place] rules  Risk Prescription drug management. Decision regarding hospitalization. Diagnosis or treatment significantly limited by social determinants of health. Risk Details: Poor health literacy         Final Clinical Impression(s) / ED Diagnoses Final diagnoses:  Laceration of scalp, initial encounter    Rx  / DC Orders ED Discharge Orders     None         Rolinda Climes, DO 10/08/23 1502

## 2023-10-12 ENCOUNTER — Emergency Department (HOSPITAL_BASED_OUTPATIENT_CLINIC_OR_DEPARTMENT_OTHER)
Admission: EM | Admit: 2023-10-12 | Discharge: 2023-10-12 | Disposition: A | Discharge status: Home / Self Care | Attending: Emergency Medicine | Admitting: Emergency Medicine

## 2023-10-12 ENCOUNTER — Encounter: Payer: Self-pay | Admitting: Pulmonary Disease

## 2023-10-12 ENCOUNTER — Other Ambulatory Visit: Payer: Self-pay

## 2023-10-12 ENCOUNTER — Encounter (HOSPITAL_BASED_OUTPATIENT_CLINIC_OR_DEPARTMENT_OTHER): Payer: Self-pay | Admitting: Urology

## 2023-10-12 ENCOUNTER — Ambulatory Visit (INDEPENDENT_AMBULATORY_CARE_PROVIDER_SITE_OTHER): Admitting: Pulmonary Disease

## 2023-10-12 VITALS — BP 154/71 | HR 116

## 2023-10-12 DIAGNOSIS — S81801A Unspecified open wound, right lower leg, initial encounter: Secondary | ICD-10-CM | POA: Insufficient documentation

## 2023-10-12 DIAGNOSIS — E119 Type 2 diabetes mellitus without complications: Secondary | ICD-10-CM | POA: Insufficient documentation

## 2023-10-12 DIAGNOSIS — J8410 Pulmonary fibrosis, unspecified: Secondary | ICD-10-CM

## 2023-10-12 DIAGNOSIS — R000 Tachycardia, unspecified: Secondary | ICD-10-CM | POA: Insufficient documentation

## 2023-10-12 DIAGNOSIS — K7581 Nonalcoholic steatohepatitis (NASH): Secondary | ICD-10-CM | POA: Diagnosis not present

## 2023-10-12 DIAGNOSIS — W228XXA Striking against or struck by other objects, initial encounter: Secondary | ICD-10-CM | POA: Insufficient documentation

## 2023-10-12 DIAGNOSIS — Z5181 Encounter for therapeutic drug level monitoring: Secondary | ICD-10-CM

## 2023-10-12 DIAGNOSIS — Z7984 Long term (current) use of oral hypoglycemic drugs: Secondary | ICD-10-CM | POA: Insufficient documentation

## 2023-10-12 DIAGNOSIS — S8991XA Unspecified injury of right lower leg, initial encounter: Secondary | ICD-10-CM | POA: Diagnosis present

## 2023-10-12 DIAGNOSIS — Z7982 Long term (current) use of aspirin: Secondary | ICD-10-CM | POA: Diagnosis not present

## 2023-10-12 LAB — CBC WITH DIFFERENTIAL/PLATELET
Abs Immature Granulocytes: 0.02 10*3/uL (ref 0.00–0.07)
Basophils Absolute: 0 10*3/uL (ref 0.0–0.1)
Basophils Relative: 0 %
Eosinophils Absolute: 0.2 10*3/uL (ref 0.0–0.5)
Eosinophils Relative: 3 %
HCT: 44.2 % (ref 36.0–46.0)
Hemoglobin: 15.4 g/dL — ABNORMAL HIGH (ref 12.0–15.0)
Immature Granulocytes: 0 %
Lymphocytes Relative: 19 %
Lymphs Abs: 1.1 10*3/uL (ref 0.7–4.0)
MCH: 31.6 pg (ref 26.0–34.0)
MCHC: 34.8 g/dL (ref 30.0–36.0)
MCV: 90.6 fL (ref 80.0–100.0)
Monocytes Absolute: 0.4 10*3/uL (ref 0.1–1.0)
Monocytes Relative: 8 %
Neutro Abs: 4.2 10*3/uL (ref 1.7–7.7)
Neutrophils Relative %: 70 %
Platelets: 113 10*3/uL — ABNORMAL LOW (ref 150–400)
RBC: 4.88 MIL/uL (ref 3.87–5.11)
RDW: 13.5 % (ref 11.5–15.5)
WBC: 5.9 10*3/uL (ref 4.0–10.5)
nRBC: 0 % (ref 0.0–0.2)

## 2023-10-12 LAB — BASIC METABOLIC PANEL WITH GFR
Anion gap: 13 (ref 5–15)
BUN: 15 mg/dL (ref 8–23)
CO2: 25 mmol/L (ref 22–32)
Calcium: 9.6 mg/dL (ref 8.9–10.3)
Chloride: 99 mmol/L (ref 98–111)
Creatinine, Ser: 0.66 mg/dL (ref 0.44–1.00)
GFR, Estimated: >60 mL/min (ref >60)
Glucose, Bld: 265 mg/dL — ABNORMAL HIGH (ref 70–99)
Potassium: 3.9 mmol/L (ref 3.5–5.1)
Sodium: 137 mmol/L (ref 135–145)

## 2023-10-12 MED ORDER — DOXYCYCLINE HYCLATE 100 MG PO CAPS: 100.0000 mg | ORAL_CAPSULE | Freq: Two times a day (BID) | ORAL | 0 refills | Status: DC

## 2023-10-12 MED ORDER — VANCOMYCIN HCL IN DEXTROSE 1-5 GM/200ML-% IV SOLN
1000.0000 mg | Freq: Once | INTRAVENOUS | Status: AC
  Administered 2023-10-12: 1000 mg via INTRAVENOUS
  Filled 2023-10-12: qty 200

## 2023-10-12 MED ORDER — CEPHALEXIN 500 MG PO CAPS: 500.0000 mg | ORAL_CAPSULE | Freq: Three times a day (TID) | ORAL | 0 refills | Status: DC

## 2023-10-12 NOTE — Addendum Note (Signed)
 Addended byGregory Leash, Mj Willis A on: 10/12/2023 04:50 PM   Modules accepted: Orders

## 2023-10-12 NOTE — Progress Notes (Addendum)
 Heather Townsend    [id number]    1946-05-30  Primary Care Physician:Hunter, Saverio Curling, MD  Referring Physician: Almira Jaeger, MD [address] [place],  [place] [address]  Chief complaint:   Follow up for lung fibrosis.  HPI: Heather Townsend is a 78 y.o. with history of Heather Townsend cirrhosis, GERD, hiatal hernia. She had an MRI of the abdomen as a part of follow-up for cirrhosis. It showed fibrosis at lung bases. She has been referred here for further follow-up. She has some mild dyspnea on exertion but no other symptoms confined to the chest. She denies any cough, wheezing, sputum production, hemoptysis. She does not have any symptoms suggestive of connective tissue, autoimmune disease. She does have history of unspecified lung fibrosis in her mother and aunt. She does not have any exposures at work to asbestos or other inhalants.  Evaluated by Dr. Bascom Lily on November 2020 for elevated antibodies with no evidence of autoimmune disease Received rheumatology clinic note from Dr. Bascom Lily, [facility] dated 04/11/2019. Repeat labs including CK, aldolase, CRP negative, sed rate 40.   No concern for active connective tissue disease.  Discussed at multidisciplinary ILD conference in Jan 2021 with diagnosis of likely IPF  Interim History:  Discussed the use of AI scribe software for clinical note transcription with the patient, who gave verbal consent to proceed.  History of Present Illness Heather Townsend is a 78 year old female with idiopathic pulmonary fibrosis who presents for follow-up regarding her lung condition.  She has idiopathic pulmonary fibrosis with a gradual worsening of symptoms, including increased dyspnea, particularly during activities like house cleaning, necessitating frequent rest. Her last lung scan was in October 2024. She has been prescribed Esbriet  but has not initiated it due to concerns about potential interactions with medications for sciatica,  which developed post-kidney stone procedure.  She experienced a recent fall after tripping over a rug in the dark, resulting in a head laceration requiring eight staples. Despite this, she continued activities such as mowing the lawn, during which she slid off the mower and injured her leg. She has not yet sought medical attention for the leg injury but plans to do so after this visit.  Her medical history includes a kidney stone treated with lithotripsy, after which she developed sciatica. Gabapentin was prescribed but was ineffective. She also experienced a fall after taking an unspecified medication.  She has metabolic associated steatohepatitis with slightly elevated liver function tests, last checked in January 2025. 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  In the review of systems, she reports increased shortness of breath with activity and a history of falls. No recent lung scans since October 2024.     Outpatient Encounter Medications as of 10/12/2023  Medication Sig   aspirin 81 MG chewable tablet Chew 81 mg by mouth daily as needed for mild pain.   Cyanocobalamin  1000 MCG/ML KIT Inject 1,000 mg as directed. Pt does self injections twice a month.   esomeprazole (NEXIUM) 40 MG packet Take 40 mg by mouth 3 (three) times a week.   HYDROcodone -acetaminophen  (NORCO/VICODIN) 5-325 MG tablet Take 1 tablet by mouth every 4 (four) hours as needed.   metFORMIN  (GLUCOPHAGE ) 500 MG tablet Take 1 tablet (500 mg total) by mouth 2 (two) times daily with a meal.   Multiple Vitamin (MULTIVITAMIN ADULT PO) Take by mouth. "One a day"   POTASSIUM PO Take  by mouth. Twice a week.   No facility-administered encounter medications on file as of 10/12/2023.   Physical Exam: Blood pressure (!) 154/71, pulse (!) 116, SpO2 92%. Gen:      No acute distress HEENT:  EOMI, sclera anicteric Neck:     No masses; no thyromegaly Lungs:    Bibasilar crackles CV:          Regular rate and rhythm; no murmurs Abd:      + bowel sounds; soft, non-tender; no palpable masses, no distension Ext:    No edema; adequate peripheral perfusion Skin:      Warm and dry; no rash Neuro: alert and oriented x 3 Psych: normal mood and affect   Data Reviewed: CT abdomen 12/04/14- Reticulation at bases, unchanged since 2011 CT abdomen 06/02/11- Reticulation at bases. Images reviewed. 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 NSIP fibrosis High res CT 10/01/16- ILD, fibrosis, unchanged.  High res CT 10/20/17-mild progression pulmonary fibrosis, possible honeycombing High-res CT 04/04/2019-mild progression of pulmonary fibrosis in UIP pattern. CT chest 10/26/2019-stable pulmonary fibrosis High-resolution CT chest 04/01/2022-pulmonary pattern of fibrosis in UIP pattern which is likely progressive, cirrhosis with portal hypertension. High resolution CT chest 03/22/2023-mild progression in pulmonary fibrosis and UIP pattern I have reviewed all images personally.   Serologies 09/05/15 CRP 0.4, sedimentation rate 43 Aldolase 11.2 P-ANCA 1:40 dsDNA- 6 Negative- Jo-1, centromere, RNP, RO, LA RA, CCP, Scl 70   PFTs 09/29/15 FVC 2.45 (80%), FEV1 2.18 (93%), F/F 89, TLC 79%, DLCO 53% Mild restriction, moderately severe diffusion defect.  6-minute walk test 04/11/2019-510 m, exertional O2 sat 93% Assessment & Plan Idiopathic Pulmonary Fibrosis (IPF) IPF is slowly progressing with increased scarring noted on the October 2024 scan. Symptoms include increased dyspnea on exertion. Esbriet  (pirfenidone ) was prescribed to slow disease progression but has not been initiated due to concerns about drug interactions. LFTs are slightly elevated due to [facility], but if well-managed, Esbriet  can be initiated. The medication is expected to slow the progression of IPF but not halt it.  She has been approved for Esbriet  in early 2024 but has not started it yet.  Oxygen therapy may be  considered if oxygen saturation drops below 88% during exertion. We discussed PFTs but she would like to avoid these.  - Order LFTs before starting Esbriet  - Initiate Esbriet  if LFTs are well-managed  Hypoxia, need for oxygen SATURATION QUALIFICATIONS: (This note is used to comply with regulatory documentation for home oxygen)  Patient Saturations on Room Air at Rest = 91%  Patient Saturations on Room Air while Ambulating = 85%  Patient Saturations on 3 Liters of oxygen while Ambulating = 94%  Please briefly explain why patient needs home oxygen: Pt qualified for 3L continuous oxygen and 4L pulsed oxygen to obtain sats above 88-90%  Metabolic Associated Steatohepatitis (MASH) MASH is present with slightly elevated LFTs. Monitoring is required to ensure stability before starting Esbriet  for IPF. - Order LFTs to monitor MASH    Health maintenance Does not want to get vaccinations.  Plan/Recommendations: Start Esbriet  Check monthly hepatic panel  Phyllis Breeze MD [facility] 10/12/2023, 4:03 PM  CC: Almira Jaeger, MD

## 2023-10-12 NOTE — Patient Instructions (Signed)
 VISIT SUMMARY:  During your visit, we discussed your idiopathic pulmonary fibrosis (IPF), metabolic associated steatohepatitis (MASH), sciatica, and recent injuries. We reviewed your symptoms, current medications, and recent medical history, including your concerns about starting Esbriet  for IPF due to potential drug interactions. We also addressed your recent fall and scalp laceration, as well as your ongoing leg injury.  YOUR PLAN:  -IDIOPATHIC PULMONARY FIBROSIS (IPF): IPF is a lung disease that causes scarring of the lung tissue, leading to difficulty breathing. Your condition is slowly progressing, and we discussed starting Esbriet  (pirfenidone ) to help slow this progression. Before starting Esbriet , we need to check your liver function tests (LFTs) to ensure they are stable. If your LFTs are well-managed, you can begin taking Esbriet . We will also perform a walking test to determine if you need supplemental oxygen during activities.  -METABOLIC ASSOCIATED STEATOHEPATITIS (MASH): MASH is a liver condition characterized by fat accumulation and inflammation in the liver. Your LFTs are slightly elevated, and we need to monitor them to ensure they remain stable, especially before starting Esbriet  for your IPF. We will order LFTs to keep track of your liver health.   INSTRUCTIONS:  1. Get your liver function tests (LFTs) done before starting Esbriet . 2. If your LFTs are stable, begin taking Esbriet  as prescribed. 3. Perform a walking test to assess the need for supplemental oxygen.

## 2023-10-12 NOTE — Discharge Instructions (Addendum)
 Please refer to the attached instructions. Take antibiotic as directed. Return to the ED or to an urgent care for wound check on Friday.

## 2023-10-12 NOTE — ED Provider Notes (Signed)
 [place] [facility] Provider Note   CSN: [id number] Arrival date & time: 10/12/23  1716     History  Chief Complaint  Patient presents with   Leg Injury    Heather Townsend is a 78 y.o. female.  Patient presents for evaluation of right lower leg wound that occurred on Friday. She was struck by the plastic exit chute on the deck of her mower. Bruising of leg noted. Patient is ambulatory with mild discomfort. She has been cleaning with peroxide, applying neosporin and bactine spray.  The history is provided by the patient. No language interpreter was used.  Injury This is a new problem. The current episode started more than 2 days ago. The problem has not changed since onset.      Home Medications Prior to Admission medications   Medication Sig Start Date End Date Taking? Authorizing Provider  aspirin 81 MG chewable tablet Chew 81 mg by mouth daily as needed for mild pain.    [provider]  Cyanocobalamin  1000 MCG/ML KIT Inject 1,000 mg as directed. Pt does self injections twice a month.    [provider]  esomeprazole (NEXIUM) 40 MG packet Take 40 mg by mouth 3 (three) times a week.    [provider]  HYDROcodone -acetaminophen  (NORCO/VICODIN) 5-325 MG tablet Take 1 tablet by mouth every 4 (four) hours as needed. 10/02/22   [provider]  metFORMIN  (GLUCOPHAGE ) 500 MG tablet Take 1 tablet (500 mg total) by mouth 2 (two) times daily with a meal. 09/17/22   Almira Jaeger, MD  Multiple Vitamin (MULTIVITAMIN ADULT PO) Take by mouth. "One a day"    [provider]  POTASSIUM PO Take by mouth. Twice a week.    [provider]      Allergies    Sulfasalazine, Celecoxib, Codeine, Ibuprofen, Meloxicam, Nsaids, Rofecoxib, Sulfa antibiotics, and Sulfonamide derivatives    Review of Systems   Review of Systems  Physical Exam Updated Vital Signs BP (!) 197/82 (BP Location: Left Arm)    Pulse (!) 118   Temp 98 F (36.7 C)   Resp 18   Ht 5\' 4"  (1.626 m)   Wt 61 kg   SpO2 90%   BMI 23.08 kg/m  Physical Exam HENT:     Head: Normocephalic.  Cardiovascular:     Rate and Rhythm: Tachycardia present.     Pulses: Normal pulses.  Pulmonary:     Effort: Pulmonary effort is normal.  Musculoskeletal:        General: Tenderness and signs of injury present. No deformity.     Comments: Lower leg abrasion on anterior surface from mid-tibia to ankle. Scab present with serous drainage noted.  Skin:    General: Skin is warm and dry.  Neurological:     Mental Status: She is alert and oriented to person, place, and time.  Psychiatric:        Mood and Affect: Mood normal.        Behavior: Behavior normal.     ED Results / Procedures / Treatments   Labs (all labs ordered are listed, but only abnormal results are displayed) Labs Reviewed - No data to display  EKG None  Radiology No results found.  Procedures Procedures    Medications Ordered in ED Medications - No data to display  ED Course/ Medical Decision Making/ A&P  Patient initially tachycardic. Rate has normalized while in the ED. Labs are reassuring.  Medical Decision Making Amount and/or Complexity of Data Reviewed Labs: ordered. ECG/medicine tests: ordered.  Risk Prescription drug management.   Patient present for evaluation of wound on right lower leg. Afebrile and hemodynamically stable. Wound care instructions provided. Patient is diabetic. Given vancomycin in ED. Will continue with keflex and doxycycline at home for 7 days. Patient to return for wound check on Friday. Pt has a good understanding of return precautions and is safe for discharge at this time.         Final Clinical Impression(s) / ED Diagnoses Final diagnoses:  Leg wound, right, initial encounter    Rx / DC Orders ED Discharge Orders          Ordered    cephALEXin (KEFLEX) 500 MG capsule   3 times daily        10/12/23 2011    doxycycline (VIBRAMYCIN) 100 MG capsule  2 times daily,   Status:  Discontinued        10/12/23 2011    doxycycline (VIBRAMYCIN) 100 MG capsule  2 times daily        10/12/23 2012              Gigi Kyle, NP 10/12/23 2224    Dalene Duck, MD 10/12/23 2325

## 2023-10-12 NOTE — ED Triage Notes (Signed)
 Right leg laceration/avulsion  Was mowing on Friday and slid off seat, plastic piece hit right lower leg  Wound looks infected, purulous drainage noted  Been cleaning it and putting neosporin on it    Was seen at pulmonolgy today and ordered oxygen for home use, SPO2 noted to be 90%

## 2023-10-13 ENCOUNTER — Telehealth: Payer: Self-pay | Admitting: *Deleted

## 2023-10-13 LAB — COMPREHENSIVE METABOLIC PANEL WITH GFR
ALT: 41 U/L — ABNORMAL HIGH (ref 0–35)
AST: 47 U/L — ABNORMAL HIGH (ref 0–37)
Albumin: 4.1 g/dL (ref 3.5–5.2)
Alkaline Phosphatase: 127 U/L — ABNORMAL HIGH (ref 39–117)
BUN: 16 mg/dL (ref 6–23)
CO2: 26 meq/L (ref 19–32)
Calcium: 9.5 mg/dL (ref 8.4–10.5)
Chloride: 99 meq/L (ref 96–112)
Creatinine, Ser: 0.72 mg/dL (ref 0.40–1.20)
GFR: 81.34 mL/min (ref >60.00)
Glucose, Bld: 338 mg/dL — ABNORMAL HIGH (ref 70–99)
Potassium: 4.1 meq/L (ref 3.5–5.1)
Sodium: 134 meq/L — ABNORMAL LOW (ref 135–145)
Total Bilirubin: 0.7 mg/dL (ref 0.2–1.2)
Total Protein: 8.5 g/dL — ABNORMAL HIGH (ref 6.0–8.3)

## 2023-10-13 NOTE — Telephone Encounter (Signed)
 PT called regarding a call she received from her pharmacy about her Keflex prescription .Aaron AasAaron AasEDCM clarified with EDP  to change sig Rx to: #21.   RNCM notified pharmacy of change.   Hyatt Capobianco J. Rachel Budds, RN, BSN, NCM  Transitions of Care  Nurse Case Manager  [facility] Emergency Departments  Operative Services  [phone]

## 2023-10-15 ENCOUNTER — Other Ambulatory Visit: Payer: Self-pay

## 2023-10-15 ENCOUNTER — Emergency Department (HOSPITAL_BASED_OUTPATIENT_CLINIC_OR_DEPARTMENT_OTHER)
Admission: EM | Admit: 2023-10-15 | Discharge: 2023-10-15 | Disposition: A | Discharge status: Home / Self Care | Attending: Emergency Medicine | Admitting: Emergency Medicine

## 2023-10-15 DIAGNOSIS — S81801D Unspecified open wound, right lower leg, subsequent encounter: Secondary | ICD-10-CM | POA: Diagnosis not present

## 2023-10-15 DIAGNOSIS — Z4800 Encounter for change or removal of nonsurgical wound dressing: Secondary | ICD-10-CM | POA: Insufficient documentation

## 2023-10-15 DIAGNOSIS — Z5189 Encounter for other specified aftercare: Secondary | ICD-10-CM

## 2023-10-15 DIAGNOSIS — I10 Essential (primary) hypertension: Secondary | ICD-10-CM | POA: Diagnosis not present

## 2023-10-15 DIAGNOSIS — W228XXD Striking against or struck by other objects, subsequent encounter: Secondary | ICD-10-CM | POA: Diagnosis not present

## 2023-10-15 DIAGNOSIS — Z4801 Encounter for change or removal of surgical wound dressing: Secondary | ICD-10-CM | POA: Diagnosis not present

## 2023-10-15 DIAGNOSIS — Z79899 Other long term (current) drug therapy: Secondary | ICD-10-CM | POA: Insufficient documentation

## 2023-10-15 DIAGNOSIS — Z7982 Long term (current) use of aspirin: Secondary | ICD-10-CM | POA: Diagnosis not present

## 2023-10-15 DIAGNOSIS — Z7984 Long term (current) use of oral hypoglycemic drugs: Secondary | ICD-10-CM | POA: Diagnosis not present

## 2023-10-15 DIAGNOSIS — E119 Type 2 diabetes mellitus without complications: Secondary | ICD-10-CM | POA: Insufficient documentation

## 2023-10-15 MED ORDER — BACITRACIN ZINC 500 UNIT/GM EX OINT: TOPICAL_OINTMENT | Freq: Once | CUTANEOUS | Status: DC

## 2023-10-15 NOTE — Discharge Instructions (Addendum)
 Gently clean your wound with water once daily. Place antibiotic ointment such as bacitracin or neosporin over your wound after cleaning the area.  Keep the laceration covered with sterile gauze as shown here.  Do not submerge your laceration in water (no baths, swimming) until it is fully healed. You may shower.   You may take up to 1000mg  of tylenol  every 6 hours as needed for pain. Do not take more then 4g per day.   You may use up to 600mg  ibuprofen every 6 hours as needed for pain.  Do not exceed 2.4g of ibuprofen per day.  Please continue taking your doxycycline and Keflex as prescribed.  Return to the emergency room on Monday, 10/18/2023 for removal of your staples and recheck of your leg wound.  Return to the ER sooner should you develop fever, chills, pus drainage from your wound, redness around your wound.

## 2023-10-15 NOTE — ED Provider Notes (Signed)
 [place] [facility] Provider Note   CSN: [id number] Arrival date & time: 10/15/23  1028     History  Chief Complaint  Patient presents with   Wound Check    Heather Townsend is a 78 y.o. female with history of type 2 diabetes, hypertension, presents for a wound recheck of a wound on her right lower extremity that was sustained on 10/08/2023.  States she hit her leg with the plastic piece of a lawnmower.  She was evaluated in the ER after the incident, and prescribed doxycycline  and Keflex  which she has been compliant with.  Has been covering the wound at home with petroleum.  Denies any fevers, chills, redness around the wound, pus drainage.  Denies any numbness or tingling in the right lower extremity.  Reports some pain associated with the bruising on her right leg.   Wound Check       Home Medications Prior to Admission medications   Medication Sig Start Date End Date Taking? Authorizing Provider  aspirin 81 MG chewable tablet Chew 81 mg by mouth daily as needed for mild pain.    [provider]  cephALEXin  (KEFLEX ) 500 MG capsule Take 1 capsule (500 mg total) by mouth 3 (three) times daily. 10/12/23   Gigi Kyle, NP  Cyanocobalamin  1000 MCG/ML KIT Inject 1,000 mg as directed. Pt does self injections twice a month.    [provider]  doxycycline  (VIBRAMYCIN ) 100 MG capsule Take 1 capsule (100 mg total) by mouth 2 (two) times daily. One po bid x 7 days 10/12/23   Gigi Kyle, NP  esomeprazole (NEXIUM) 40 MG packet Take 40 mg by mouth 3 (three) times a week.    [provider]  HYDROcodone -acetaminophen  (NORCO/VICODIN) 5-325 MG tablet Take 1 tablet by mouth every 4 (four) hours as needed. 10/02/22   [provider]  metFORMIN  (GLUCOPHAGE ) 500 MG tablet Take 1 tablet (500 mg total) by mouth 2 (two) times daily with a meal. 09/17/22   Almira Jaeger, MD  Multiple Vitamin (MULTIVITAMIN ADULT PO) Take by mouth.  "One a day"    [provider]  POTASSIUM PO Take by mouth. Twice a week.    [provider]      Allergies    Sulfasalazine, Celecoxib, Codeine, Ibuprofen, Meloxicam, Nsaids, Rofecoxib, Sulfa antibiotics, and Sulfonamide derivatives    Review of Systems   Review of Systems  Constitutional:  Negative for fever.  Skin:  Positive for wound.    Physical Exam Updated Vital Signs BP (!) 149/88   Pulse 87   Temp 98.3 F (36.8 C) (Oral)   Resp 15   Ht 5\' 4"  (1.626 m)   Wt 61.2 kg   SpO2 93%   BMI 23.17 kg/m  Physical Exam Vitals and nursing note reviewed.  Constitutional:      Appearance: Normal appearance.  HENT:     Head: Atraumatic.     Comments: Posterior right scalp with staples in her laceration.  No pus drainage, surrounding erythema or edema.  Laceration with appropriate healing. Cardiovascular:     Rate and Rhythm: Normal rate and regular rhythm.     Comments: Dorsalis pedis and posterior tibialis pulse 2+ on right lower extremity Pulmonary:     Effort: Pulmonary effort is normal.  Musculoskeletal:     Comments: Able to fully flex and extend the right knee, able to plantarflex and dorsiflex at the right ankle without difficulty.  Able to ambulate without  difficulty  Moderate edema surrounding right ankle diffusely  Calf soft  Skin:    Comments: Right lower extremity with large abrasion to the anterior shin, approximately 5cm x 3cm that is not fully scabbed over yet.  No purulence.  No surrounding erythema or edema.  There is surrounding ecchymoses diffusely of the anterior shin and posterior calf.  Also has scabbed over abrasions overlying the distal tibia and anterior aspect of the ankle. No surrounding erythema or edema  Neurological:     General: No focal deficit present.     Mental Status: She is alert.     Comments: Intact sensation to the 1st through 5th toes of the right lower extremity, able to wiggle toes without difficulty  Psychiatric:         Mood and Affect: Mood normal.        Behavior: Behavior normal.          ED Results / Procedures / Treatments   Labs (all labs ordered are listed, but only abnormal results are displayed) Labs Reviewed - No data to display  EKG None  Radiology No results found.  Procedures Procedures    Medications Ordered in ED Medications  bacitracin  ointment (has no administration in time range)    ED Course/ Medical Decision Making/ A&P                                 Medical Decision Making    Differential diagnosis includes but is not limited to cellulitis, skin abrasion, systemic infection, abscess, compartment syndrome   ED Course:  Upon initial evaluation, patient is well-appearing, stable vitals aside from elevated blood pressure of 149/88.  Stable vitals. Afebrile, not tachycardic.  She presents requesting recheck of her lower extremity wound.  Upon reviewing image from visit on 10/12/2023, wound of RLE with improved healing and scabbing.  Remains without erythema, edema, or pus drainage.  No signs of infection.  There is increased ecchymoses of the anterior shin and calf of the right lower extremity.  Associated tenderness to palpation over the bruising, but calf remains soft, neuro vastly intact in the right lower extremity, no concern for compartment syndrome.  Able to ambulate without difficulty.  Staples that were placed on 10/08/2023 in the right posterior scalp remain intact.  At laceration with appropriate healing and without signs of infection. Her wound was irrigated with sterile saline.  Dressed with bacitracin  ointment and sterile gauze upon discharge.  Medications Given: Bacitracin     Impression: Wound re-check  Disposition:  The patient was discharged home with instructions to keep wound clean with soap and water at home.  Apply bacitracin  ointment over the wound after cleaning, and dressed with sterile gauze as shown here.  Return for staple removal  and wound recheck on 10/18/2023.  Continue taking full course of doxycycline  and Keflex  Return precautions given.    Record Review: External records from outside source obtained and reviewed including ER note from 10/12/2023 and 10/08/2023     This chart was dictated using voice recognition software, Dragon. Despite the best efforts of this provider to proofread and correct errors, errors may still occur which can change documentation meaning.          Final Clinical Impression(s) / ED Diagnoses Final diagnoses:  Visit for wound check    Rx / DC Orders ED Discharge Orders     None  Rexie Catena, PA-C 10/15/23 1221    Wynetta Heckle, MD 10/20/23 [phone]

## 2023-10-15 NOTE — ED Triage Notes (Signed)
 Pt POV shuffling gait- had stitches in RLE placed 5/13, wants wound checked. Denies redness, swelling, drainage. Also reports staples to posterior head, denies concern for infection. Denies fever.

## 2023-10-18 ENCOUNTER — Emergency Department (HOSPITAL_BASED_OUTPATIENT_CLINIC_OR_DEPARTMENT_OTHER)
Admission: EM | Admit: 2023-10-18 | Discharge: 2023-10-18 | Disposition: A | Discharge status: Home / Self Care | Attending: Emergency Medicine | Admitting: Emergency Medicine

## 2023-10-18 ENCOUNTER — Telehealth: Payer: Self-pay

## 2023-10-18 ENCOUNTER — Encounter (HOSPITAL_BASED_OUTPATIENT_CLINIC_OR_DEPARTMENT_OTHER): Payer: Self-pay | Admitting: Emergency Medicine

## 2023-10-18 ENCOUNTER — Other Ambulatory Visit: Payer: Self-pay

## 2023-10-18 DIAGNOSIS — Z4801 Encounter for change or removal of surgical wound dressing: Secondary | ICD-10-CM | POA: Insufficient documentation

## 2023-10-18 DIAGNOSIS — Z7982 Long term (current) use of aspirin: Secondary | ICD-10-CM | POA: Insufficient documentation

## 2023-10-18 DIAGNOSIS — Z4802 Encounter for removal of sutures: Secondary | ICD-10-CM | POA: Insufficient documentation

## 2023-10-18 DIAGNOSIS — S0101XD Laceration without foreign body of scalp, subsequent encounter: Secondary | ICD-10-CM | POA: Insufficient documentation

## 2023-10-18 DIAGNOSIS — W1809XD Striking against other object with subsequent fall, subsequent encounter: Secondary | ICD-10-CM | POA: Diagnosis not present

## 2023-10-18 DIAGNOSIS — Z5189 Encounter for other specified aftercare: Secondary | ICD-10-CM

## 2023-10-18 NOTE — Telephone Encounter (Signed)
 Copied from CRM [id number]. Topic: Clinical - Prescription Issue >> Oct 15, 2023 10:11 AM Heather Townsend wrote: Reason for CRM: Patient states Dr. Waylan Haggard advised her he will send an order for her to get oxygen but patient does not know where to pick it up as there is no record in her chat. Please call patient back and advise.  I placed order for O2 at [facility] 5/13. Per referral note they are awaiting Dr. Darcy Eaton signature for order to be processed.  Dr. Waylan Haggard please advise

## 2023-10-18 NOTE — ED Triage Notes (Signed)
 Here for stitches removal , done 1 week ago .  Persistent pain .

## 2023-10-18 NOTE — Telephone Encounter (Signed)
 Sent Alvaro Jim message regarding signature. Order has been signed. Nothing further needed

## 2023-10-18 NOTE — Telephone Encounter (Signed)
 I think this order has been signed.  Can you please check and let me know.  If you cannot find the order then please resend it again for signature

## 2023-10-18 NOTE — Discharge Instructions (Addendum)
 Please follow up with primary care for wound recheck and further care.  Return to ER with sign of infection or new/worsening symptoms.

## 2023-10-18 NOTE — ED Provider Notes (Signed)
 [place] [facility] Provider Note   CSN: [id number] Arrival date & time: 10/18/23  1027     History  Chief Complaint  Patient presents with   Suture / Staple Removal    Heather Townsend is a 78 y.o. female.  Reporting to emergency room with complaint of suture removal.  Patient reports that 10 days ago she was evaluated in ER after she fell and struck the posterior aspect of her scalp on the ground.  Since then she has had improvement gradually of her headaches and symptoms.  She has not noted any draining or signs of infection from this wound.  Also coming for wound recheck of right anterior shin that has been gradually improving.  No significant surrounding erythema.  She is taking Keflex  and doxycycline  to prevent wound infection.  She has been doing wound care at home daily.  Has been taking anti-inflammatory and Tylenol  for pain control.    Suture / Staple Removal       Home Medications Prior to Admission medications   Medication Sig Start Date End Date Taking? Authorizing Provider  aspirin 81 MG chewable tablet Chew 81 mg by mouth daily as needed for mild pain.    [provider]  cephALEXin  (KEFLEX ) 500 MG capsule Take 1 capsule (500 mg total) by mouth 3 (three) times daily. 10/12/23   Gigi Kyle, NP  Cyanocobalamin  1000 MCG/ML KIT Inject 1,000 mg as directed. Pt does self injections twice a month.    [provider]  doxycycline  (VIBRAMYCIN ) 100 MG capsule Take 1 capsule (100 mg total) by mouth 2 (two) times daily. One po bid x 7 days 10/12/23   Gigi Kyle, NP  esomeprazole (NEXIUM) 40 MG packet Take 40 mg by mouth 3 (three) times a week.    [provider]  HYDROcodone -acetaminophen  (NORCO/VICODIN) 5-325 MG tablet Take 1 tablet by mouth every 4 (four) hours as needed. 10/02/22   [provider]  metFORMIN  (GLUCOPHAGE ) 500 MG tablet Take 1 tablet (500 mg total) by mouth 2 (two) times daily with a meal.  09/17/22   Almira Jaeger, MD  Multiple Vitamin (MULTIVITAMIN ADULT PO) Take by mouth. "One a day"    [provider]  POTASSIUM PO Take by mouth. Twice a week.    [provider]      Allergies    Sulfasalazine, Celecoxib, Codeine, Ibuprofen, Meloxicam, Nsaids, Rofecoxib, Sulfa antibiotics, and Sulfonamide derivatives    Review of Systems   Review of Systems  Skin:  Positive for wound.    Physical Exam Updated Vital Signs BP (!) 167/75 (BP Location: Right Arm)   Pulse 88   Temp 97.8 F (36.6 C) (Oral)   Resp 20   Wt 61 kg   SpO2 94%   BMI 23.08 kg/m  Physical Exam Vitals and nursing note reviewed.  Constitutional:      General: She is not in acute distress.    Appearance: She is not toxic-appearing.  HENT:     Head: Normocephalic and atraumatic.  Eyes:     General: No scleral icterus.    Conjunctiva/sclera: Conjunctivae normal.  Cardiovascular:     Rate and Rhythm: Normal rate and regular rhythm.     Pulses: Normal pulses.     Heart sounds: Normal heart sounds.  Pulmonary:     Effort: Pulmonary effort is normal. No respiratory distress.     Breath sounds: Normal breath sounds.  Abdominal:     General:  Abdomen is flat. Bowel sounds are normal.     Palpations: Abdomen is soft.     Tenderness: There is no abdominal tenderness.  Skin:    General: Skin is warm and dry.     Findings: No lesion.     Comments: 9 staples to posterior aspect of scalp, incision clean dry and intact.   5cmx3cm abrasion to right shin with rounding bruising.  No surrounding erythema.  No purulent drainage. Compartments soft.   Neurological:     General: No focal deficit present.     Mental Status: She is alert and oriented to person, place, and time. Mental status is at baseline.     ED Results / Procedures / Treatments   Labs (all labs ordered are listed, but only abnormal results are displayed) Labs Reviewed - No data to display  EKG None  Radiology No  results found.  Procedures Suture Removal  Date/Time: 10/18/2023 11:27 AM  Performed by: Eudora Heron, PA-C Authorized by: Eudora Heron, PA-C   Consent:    Consent obtained:  Verbal   Consent given by:  Patient   Risks, benefits, and alternatives were discussed: yes     Risks discussed:  Bleeding, pain and wound separation   Alternatives discussed:  No treatment, delayed treatment and alternative treatment Universal protocol:    Procedure explained and questions answered to patient or proxy's satisfaction: yes     Relevant documents present and verified: yes     Test results available: yes     Imaging studies available: yes     Required blood products, implants, devices, and special equipment available: yes     Site/side marked: yes     Immediately prior to procedure, a time out was called: yes     Patient identity confirmed:  Verbally with patient Location:    Location:  Head/neck   Head/neck location:  Scalp Procedure details:    Wound appearance:  No signs of infection Post-procedure details:    Post-removal:  No dressing applied   Procedure completion:  Tolerated     Medications Ordered in ED Medications - No data to display  ED Course/ Medical Decision Making/ A&P                                 Medical Decision Making  This patient presents to the ED for concern of wound recheck, this involves an extensive number of treatment options, and is a complaint that carries with it a high risk of complications and morbidity.  The differential diagnosis includes wound dehiscence, cellulitis, poor wound healing, abrasion    Additional history obtained:  Additional history obtained from 10/08/23  ED visit for scalp lac and 10/12/23 for leg wound and 10/15/23 for wound re-check --reviewed imaging from initial injury which showed no intercranial abnormality   Problem List / ED Course / Critical interventions / Medication management  Presented to emergency room with  wound recheck on right anterior shin that occurred approximately 10 days ago.  There is  bruising to area and exposed skin that appears very healthy with no surrounding erythema.  She has no fever.  Vital signs are otherwise stable.  Will rewrap and have her follow-up with primary care for further recheck.  Patient also has posterior scalp flap that was repaired 10 days ago with staples which stable removal will be performed today.  No sign of infection or limitations.  Do  not feel that further labs or workup is necessary at this time.  Staples taken out with any complication.  Feel patient stable for discharge and outpatient follow-up with primary care for further care.  I have reviewed the patients home medicines and have made adjustments as needed   Plan  F/u w/ PCP in 2-3d to ensure resolution of sx.  Patient was given return precautions. Patient stable for discharge at this time.  Patient educated on sx/dx and verbalized understanding of plan. Return to ER w/ new or worsening sx.          Final Clinical Impression(s) / ED Diagnoses Final diagnoses:  Encounter for staple removal  Encounter for wound re-check    Rx / DC Orders ED Discharge Orders     None         Eudora Heron, PA-C 10/18/23 1159    Sueellen Emery, MD 10/18/23 1243

## 2023-11-22 ENCOUNTER — Ambulatory Visit: Attending: Cardiovascular Disease | Admitting: Cardiovascular Disease

## 2023-11-22 VITALS — BP 132/62 | HR 96 | Ht 64.5 in | Wt 135.0 lb

## 2023-11-22 DIAGNOSIS — I471 Supraventricular tachycardia, unspecified: Secondary | ICD-10-CM | POA: Diagnosis not present

## 2023-11-22 MED ORDER — DILTIAZEM HCL 30 MG PO TABS: ORAL_TABLET | ORAL | 1 refills | Status: AC

## 2023-11-22 NOTE — Patient Instructions (Signed)
 Medication Instructions:  Continue Diltiazem  30 mg tablets every 4 hours as needed for heart rates greater than 110   *If you need a refill on your cardiac medications before your next appointment, please call your pharmacy*   Follow-Up: At [facility], you and your health needs are our priority.  As part of our continuing mission to provide you with exceptional heart care, our providers are all part of one team.  This team includes your primary Cardiologist (physician) and Advanced Practice Providers or APPs (Physician Assistants and Nurse Practitioners) who all work together to provide you with the care you need, when you need it.  Your next appointment:   2 year(s)  Provider:   Eulas Furbish, MD

## 2023-11-22 NOTE — Progress Notes (Signed)
  Electrophysiology Office Note:    Date:  11/22/2023   ID:  Heather Townsend, Heather Townsend 12/09/1945, MRN [id number]  PCP:  Katrinka Garnette KIDD, MD   [facility] Providers Cardiologist:  Stanly DELENA Leavens, MD     Referring MD: Katrinka Garnette KIDD, MD   History of Present Illness:    Heather Townsend is a 78 y.o. female with a hx listed below, significant for aortic atherosclerosis, Nash cirrhosis, interstitial lung disease, SVT referred for arrhythmia management.  She has had episodes of rapid palpitations over the years.  The seem to come and go in frequency and severity.  They were at their worst after her first visit with Dr. Leavens.  The episodes typically last just a few minutes and resolved with Valsalva.  They often awaken her in the nighttime. She was prescribed diltiazem  and has taken it only for 5 times in the past year.  She denies having any syncope or presyncope.      EKGs/Labs/Other Studies Reviewed Today:    Echocardiogram:  TTE 03/26/22 EF 60 to 65%, normal structure and function.  Left atrium is normal in size.   Monitors:  Zio 7d  -2023  my interpretation Sinus rhythm 55 - 132 beats per minute, average 78 10 episodes of SVT occurred, longest episode was 6 beats  Stress testing:   Advanced imaging:   Cardiac catherization   EKG:  Last EKG results: today - normal sinus rhythm   Recent Labs: 10/12/2023: ALT 41; BUN 15; Creatinine, Ser 0.66; Hemoglobin 15.4; Platelets 113; Potassium 3.9; Sodium 137     Physical Exam:    VS:  BP 132/62   Pulse 96   Ht 5' 4.5 (1.638 m)   Wt 135 lb (61.2 kg)   SpO2 (!) 88%   BMI 22.81 kg/m     Wt Readings from Last 3 Encounters:  11/22/23 135 lb (61.2 kg)  10/18/23 134 lb 7.7 oz (61 kg)  10/15/23 135 lb (61.2 kg)     GEN: Well nourished, well developed in no acute distress CARDIAC: RRR, no murmurs, rubs, gallops RESPIRATORY:  Normal work of breathing MUSCULOSKELETAL: no edema    ASSESSMENT  & PLAN:    SVT She had brief atrial runs on her monitor, but I am not convinced these are representative of her sustained symptoms. She is taking diltiazem  as needed only a few times in the past year We discussed options which would include repeat monitor, placement of an implantable loop monitor, or proceeding with electrophysiology study At present, she does not feel that her symptoms warrant any further diagnostic procedure or intervention. She will continue to take diltiazem  as needed and call us  should she have recurrence    Medication Adjustments/Labs and Tests Ordered: Current medicines are reviewed at length with the patient today.  Concerns regarding medicines are outlined above.  Orders Placed This Encounter  Procedures   EKG 12-Lead   No orders of the defined types were placed in this encounter.    Signed, Eulas FORBES Furbish, MD  11/22/2023 11:45 AM    [facility]

## 2023-12-07 ENCOUNTER — Other Ambulatory Visit: Payer: Self-pay

## 2023-12-07 ENCOUNTER — Telehealth: Payer: Self-pay

## 2023-12-07 NOTE — Telephone Encounter (Signed)
 Please schedule ov to discuss this.  Copied from CRM [id number]. Topic: Referral - Request for Referral >> Dec 06, 2023  3:55 PM Mercedes MATSU wrote: Did the patient discuss referral with their provider in the last year? No (If No - schedule appointment) (If Yes - send message)  Appointment offered? No, has upcoming appointment  Type of order/referral and detailed reason for visit: Dr. Leonore  Preference of office, provider, location: [address] P: [phone]  If referral order, have you been seen by this specialty before? No, vein specialist (If Yes, this issue or another issue? When? Where?  Can we respond through MyChart? No, requesting call back [phone] urgent request

## 2023-12-21 ENCOUNTER — Other Ambulatory Visit: Payer: Self-pay | Admitting: Nurse Practitioner

## 2023-12-21 ENCOUNTER — Ambulatory Visit (INDEPENDENT_AMBULATORY_CARE_PROVIDER_SITE_OTHER): Admitting: Family Medicine

## 2023-12-21 ENCOUNTER — Encounter: Payer: Self-pay | Admitting: Family Medicine

## 2023-12-21 VITALS — BP 142/68 | HR 100 | Temp 98.1°F | Ht 64.5 in | Wt 137.0 lb

## 2023-12-21 DIAGNOSIS — I8510 Secondary esophageal varices without bleeding: Secondary | ICD-10-CM

## 2023-12-21 DIAGNOSIS — K7460 Unspecified cirrhosis of liver: Secondary | ICD-10-CM

## 2023-12-21 DIAGNOSIS — R519 Headache, unspecified: Secondary | ICD-10-CM | POA: Diagnosis not present

## 2023-12-21 DIAGNOSIS — I83812 Varicose veins of left lower extremities with pain: Secondary | ICD-10-CM

## 2023-12-21 DIAGNOSIS — K7581 Nonalcoholic steatohepatitis (NASH): Secondary | ICD-10-CM

## 2023-12-21 DIAGNOSIS — M542 Cervicalgia: Secondary | ICD-10-CM | POA: Diagnosis not present

## 2023-12-21 DIAGNOSIS — J8410 Pulmonary fibrosis, unspecified: Secondary | ICD-10-CM | POA: Diagnosis not present

## 2023-12-21 DIAGNOSIS — D376 Neoplasm of uncertain behavior of liver, gallbladder and bile ducts: Secondary | ICD-10-CM | POA: Diagnosis not present

## 2023-12-21 DIAGNOSIS — E119 Type 2 diabetes mellitus without complications: Secondary | ICD-10-CM | POA: Diagnosis not present

## 2023-12-21 NOTE — Patient Instructions (Addendum)
 We will call you within two weeks about your referral for head and neck CT (hoping to get both approved with prior trauma) through [facility].  Their phone number is [phone].  Please call them if you have not heard in 1-2 weeks  We have placed a referral for you today to Dr. Leonore- please call their # if you do not hear within a week - the # you have listed is the right one to call  Omron series 3 or silver series is a good cuff at reasonable price- usually [employer] and [employer] has. Perhaps check 3x a week until next visit and bring log  Recommended follow up: Return for next already scheduled visit or sooner if needed. If head/neck worsen

## 2023-12-21 NOTE — Progress Notes (Signed)
 Phone [phone] In person visit   Subjective:   Heather Townsend is a 78 y.o. year old very pleasant female patient who presents for/with See problem oriented charting Chief Complaint  Patient presents with   referral for vein specialist    Past Medical History-  Patient Active Problem List   Diagnosis Date Noted   Cirrhosis of liver without ascites (HCC) 06/27/2020    Priority: High   Pulmonary fibrosis (HCC) 02/14/2019    Priority: High   SOB (shortness of breath) 08/16/2015    Priority: High   Type 2 diabetes mellitus (HCC) 03/08/2014    Priority: High   NASH (nonalcoholic steatohepatitis) 04/04/2013    Priority: High   Esophageal varices without bleeding (HCC) 02/14/2019    Priority: Medium    Left thyroid  nodule 11/06/2015    Priority: Medium    Cervical disc disease 04/16/2014    Priority: Medium    GERD 09/05/2007    Priority: Medium    Dyslipidemia 08/16/2007    Priority: Medium    Vitamin B12 deficiency 01/17/2018    Priority: Low   Osteopenia 03/07/2014    Priority: Low   Rash, skin 01/25/2013    Priority: Low   Kidney stone on left side 04/27/2011    Priority: Low   SVT (supraventricular tachycardia) (HCC) 03/04/2022   Aortic atherosclerosis (HCC) 03/04/2022   Chest pain of uncertain etiology 06/27/2020   Diabetes mellitus with coincident hypertension (HCC) 06/27/2020   Hyperlipidemia 06/27/2020   Palpitations 06/27/2020   Uncontrolled type 2 diabetes mellitus with hyperglycemia (HCC) 02/14/2019   Trigeminal neuralgia 02/14/2019    Medications- reviewed and updated Current Outpatient Medications  Medication Sig Dispense Refill   aspirin 81 MG chewable tablet Chew 81 mg by mouth daily as needed for mild pain.     Cyanocobalamin  1000 MCG/ML KIT Inject 1,000 mg as directed. Pt does self injections twice a month.     diltiazem  (CARDIZEM ) 30 MG tablet Take 1 tablet by mouth every 4 hours as needed for heart rates greater than 110 30 tablet 1    esomeprazole (NEXIUM) 40 MG packet Take 40 mg by mouth 3 (three) times a week.     HYDROcodone -acetaminophen  (NORCO/VICODIN) 5-325 MG tablet Take 1 tablet by mouth every 4 (four) hours as needed.     metFORMIN  (GLUCOPHAGE ) 500 MG tablet Take 1 tablet (500 mg total) by mouth 2 (two) times daily with a meal. 180 tablet 3   Multiple Vitamin (MULTIVITAMIN ADULT PO) Take by mouth. One a day     POTASSIUM PO Take by mouth. Twice a week.     tiZANidine (ZANAFLEX) 4 MG tablet Take 4 mg by mouth 2 (two) times daily.     No current facility-administered medications for this visit.     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 Before she decides on that would want second opinion. She has ongoing pain along anterior left lower leg coursing along a varicose vein and some similar pain in right ankle A/P: Patient with varicose vein and has had recommended procedure-she wants a second opinion from Dr. Leonore in [place] has had an excellent experience through a friend  # Head/neck/scalp pain- had fall onto back of head in may on 9th with 9 staples required to close the suture which were subsequently removed in an appropriate timeframe- seen in Emergency Department and had CT head  with multiple small locules of gas within the right occiptal scalp with subcutaneous edema likely related to  laceration but no intracranial issues. Continues pain over the area and no better than it was before. Unable to sleep on that side- can fall asleep if not on that side -I think this is related to the trauma off but will update head CT as well as neck CT if possible as she also has neck pain and does not have cervical CT to rule out spinal fracture  # Liver cirrhosis secondary to NASH-follows closely with [facility] S: Just had visit earlier today.  She reports good report Lab Results  Component Value Date   ALT 41 (H) 10/12/2023   AST 47 (H) 10/12/2023   ALKPHOS 127 (H) 10/12/2023   BILITOT 0.7 10/12/2023  A/P: Continue mild LFT elevations-continue close follow-up with Dawn -She is pending ultrasound for cancer screening purposes  # Low blood pressure reading S: medication: None Home readings #s: Does not have cough BP Readings from Last 3 Encounters:  12/21/23 (!) 142/68  11/22/23 132/62  10/18/23 (!) 167/75  A/P: 2 the last 3 readings blood pressures have been elevated but she has not recently checked at home-encouraged her to get a cuff and bring in numbers for next visit-discussed may potentially need medicine  # 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 Curt on November 2020 for elevated antibodies with no evidence of autoimmune disease/connective tissue disease A/P: Overall stable-not on therapy-continue close follow-up with pulmonology   # SVT-follows with Dr. Mealor S: Medication: Diltiazem  30 mg as needed-takes very sparingly but did have 2 nights in a row about 2 weeks ago that needed at -With minimal symptoms she has opted out of implantable loop recorder and EP study A/P: SVT-continue diltiazem  as needed    # Diabetes S: Medication: Metformin  500 mg twice  daily but reports sparingly uses worried about her kidneys A/P: We discussed poor control for diabetes 1 the greatest risk to her kidneys.  We need to control her blood sugar better and looking on recent labs did have a blood sugar over 300.  I encouraged her to restart the metformin  we can recheck at follow-up.  She wants me to consider other options-we can discuss further at that visit once we get updated lab work-she declined lab work today  #hyperlipidemia S: Medication: None Lab Results  Component Value Date   CHOL 168 03/02/2022   HDL 54.10 03/02/2022   LDLCALC 93 03/02/2022   LDLDIRECT 148.3 11/28/2012   TRIG 105.0 03/02/2022   CHOLHDL 3 03/02/2022   A/P: In light of diabetes this is elevated but statin not ideal with cirrhosis  # B12 deficiency  S: Current treatment/medication (oral vs. IM): B12 injections A/P: Reports taking these monthly-check B12 at next visit   Recommended follow up: Return for next already scheduled visit or sooner if needed. Future Appointments  Date Time Provider Department Center  12/27/2023 10:45 AM [facility]  1 [id number]  01/17/2024 11:00 AM Katrinka Garnette KIDD, MD [facility]  07/18/2024  9:20 AM [facility] ANNUAL WELLNESS VISIT 1 [facility]    Lab/Order associations:   ICD-10-CM   1. Varicose veins of left lower extremity with pain  I83.812 Ambulatory referral to Vascular Surgery    2. Chronic nonintractable headache, unspecified headache type  R51.9 CT HEAD WO CONTRAST ( )   G89.29 CT CERVICAL SPINE WO CONTRAST    3. Neck pain  M54.2 CT CERVICAL SPINE WO CONTRAST    4. Type 2 diabetes mellitus without complication, without long-term current use of insulin (HCC)  E11.9     5. Pulmonary fibrosis (HCC)  J84.10     6. NASH (nonalcoholic steatohepatitis)  K75.81     7. Cirrhosis of liver without ascites, unspecified hepatic cirrhosis type (HCC)  K74.60       No orders of the defined types were placed in this  encounter.   Return precautions advised.  Garnette Katrinka, MD

## 2023-12-27 ENCOUNTER — Ambulatory Visit
Admission: RE | Admit: 2023-12-27 | Discharge: 2023-12-27 | Disposition: A | Discharge status: Home / Self Care | Source: Ambulatory Visit | Attending: Family Medicine | Admitting: Family Medicine

## 2023-12-27 ENCOUNTER — Ambulatory Visit
Admission: RE | Admit: 2023-12-27 | Discharge: 2023-12-27 | Disposition: A | Discharge status: Home / Self Care | Source: Ambulatory Visit | Attending: Nurse Practitioner | Admitting: Nurse Practitioner

## 2023-12-27 DIAGNOSIS — K828 Other specified diseases of gallbladder: Secondary | ICD-10-CM | POA: Diagnosis not present

## 2023-12-27 DIAGNOSIS — G500 Trigeminal neuralgia: Secondary | ICD-10-CM | POA: Diagnosis not present

## 2023-12-27 DIAGNOSIS — M47812 Spondylosis without myelopathy or radiculopathy, cervical region: Secondary | ICD-10-CM | POA: Diagnosis not present

## 2023-12-27 DIAGNOSIS — I8510 Secondary esophageal varices without bleeding: Secondary | ICD-10-CM

## 2023-12-27 DIAGNOSIS — G319 Degenerative disease of nervous system, unspecified: Secondary | ICD-10-CM | POA: Diagnosis not present

## 2023-12-27 DIAGNOSIS — G8929 Other chronic pain: Secondary | ICD-10-CM

## 2023-12-27 DIAGNOSIS — D376 Neoplasm of uncertain behavior of liver, gallbladder and bile ducts: Secondary | ICD-10-CM

## 2023-12-27 DIAGNOSIS — K7460 Unspecified cirrhosis of liver: Secondary | ICD-10-CM

## 2023-12-27 DIAGNOSIS — M542 Cervicalgia: Secondary | ICD-10-CM

## 2024-01-01 ENCOUNTER — Ambulatory Visit: Payer: Self-pay | Admitting: Family Medicine

## 2024-01-17 ENCOUNTER — Ambulatory Visit: Admitting: Family Medicine

## 2024-02-11 ENCOUNTER — Ambulatory Visit: Admitting: Family Medicine

## 2024-02-15 DIAGNOSIS — I872 Venous insufficiency (chronic) (peripheral): Secondary | ICD-10-CM | POA: Diagnosis not present

## 2024-02-22 DIAGNOSIS — I872 Venous insufficiency (chronic) (peripheral): Secondary | ICD-10-CM | POA: Diagnosis not present

## 2024-02-22 DIAGNOSIS — I83899 Varicose veins of unspecified lower extremities with other complications: Secondary | ICD-10-CM | POA: Diagnosis not present

## 2024-03-20 ENCOUNTER — Ambulatory Visit: Admitting: Family Medicine

## 2024-03-22 ENCOUNTER — Other Ambulatory Visit: Payer: Self-pay | Admitting: Nurse Practitioner

## 2024-03-22 DIAGNOSIS — D376 Neoplasm of uncertain behavior of liver, gallbladder and bile ducts: Secondary | ICD-10-CM

## 2024-03-22 DIAGNOSIS — I8510 Secondary esophageal varices without bleeding: Secondary | ICD-10-CM

## 2024-03-22 DIAGNOSIS — K7581 Nonalcoholic steatohepatitis (NASH): Secondary | ICD-10-CM

## 2024-06-06 ENCOUNTER — Ambulatory Visit
Admission: RE | Admit: 2024-06-06 | Discharge: 2024-06-06 | Disposition: A | Source: Ambulatory Visit | Attending: Nurse Practitioner

## 2024-06-06 DIAGNOSIS — D376 Neoplasm of uncertain behavior of liver, gallbladder and bile ducts: Secondary | ICD-10-CM

## 2024-06-06 DIAGNOSIS — K7469 Other cirrhosis of liver: Secondary | ICD-10-CM

## 2024-06-06 DIAGNOSIS — I8510 Secondary esophageal varices without bleeding: Secondary | ICD-10-CM

## 2024-06-09 ENCOUNTER — Emergency Department (HOSPITAL_BASED_OUTPATIENT_CLINIC_OR_DEPARTMENT_OTHER)
Admission: EM | Admit: 2024-06-09 | Discharge: 2024-06-09 | Disposition: A | Discharge status: Home / Self Care | Attending: Emergency Medicine | Admitting: Emergency Medicine

## 2024-06-09 ENCOUNTER — Encounter (HOSPITAL_BASED_OUTPATIENT_CLINIC_OR_DEPARTMENT_OTHER): Payer: Self-pay

## 2024-06-09 ENCOUNTER — Emergency Department (HOSPITAL_BASED_OUTPATIENT_CLINIC_OR_DEPARTMENT_OTHER)

## 2024-06-09 ENCOUNTER — Other Ambulatory Visit: Payer: Self-pay

## 2024-06-09 DIAGNOSIS — R519 Headache, unspecified: Secondary | ICD-10-CM | POA: Insufficient documentation

## 2024-06-09 DIAGNOSIS — E119 Type 2 diabetes mellitus without complications: Secondary | ICD-10-CM | POA: Insufficient documentation

## 2024-06-09 DIAGNOSIS — Z7982 Long term (current) use of aspirin: Secondary | ICD-10-CM | POA: Diagnosis not present

## 2024-06-09 DIAGNOSIS — Z7984 Long term (current) use of oral hypoglycemic drugs: Secondary | ICD-10-CM | POA: Diagnosis not present

## 2024-06-09 NOTE — ED Notes (Signed)
 Patient transported to CT

## 2024-06-09 NOTE — Discharge Instructions (Signed)
 You are seen in the emergency department today for concerns of scalp pain and prior head injury.  Your CT imaging of your head is negative without any obvious findings to explain this pain.  You have an appoint with neurology scheduled I recommend informing them of this sensation that you been feeling over the last several months.  He can continue to try to take Tylenol  ibuprofen but I suspect he will likely need follow-up with her neurologist.  For any concerns of worsening symptoms, return to the emergency department.  Otherwise, please plan on following up with your primary care provider and your neurologist.

## 2024-06-09 NOTE — Telephone Encounter (Signed)
 Called and spoke w/ pt. Relayed results per Dawn's note. Pt verbalized understanding. She had no further questions.

## 2024-06-09 NOTE — ED Triage Notes (Addendum)
 Pt states that she had staples in her head in May and says that she has had pain there since staples were removed.  Pt states that she has oxygen  that she is supposed to use at home but doesn't use it.

## 2024-06-09 NOTE — ED Provider Notes (Signed)
 " [facility] Provider Note   CSN: [id number] Arrival date & time: 06/09/24  1956     Patient presents with: Head Injury   Heather Townsend is a 79 y.o. female.  Patient with past history significant for type 2 diabetes, pulmonary fibrosis, GERD presents the emergency department with concerns of prior head injury.  Reports about 6 months ago, had a fall with laceration repair performed in the emergency department and states that since the staples were removed, she has had ongoing pain to the right occipital scalp.  Endorses a sensation of a headache in this area but denies any significant change or improvement with any treatment at home.  She denies any significant headache, visual change or disturbance, tingling or numbness. reports prior CT imaging is negative for any acute findings to explain this pain.  She denies any tingling, numbness, weakness, or visual disturbance.   Head Injury      Prior to Admission medications  Medication Sig Start Date End Date Taking? Authorizing Provider  aspirin 81 MG chewable tablet Chew 81 mg by mouth daily as needed for mild pain.    [provider]  Cyanocobalamin  1000 MCG/ML KIT Inject 1,000 mg as directed. Pt does self injections twice a month.    [provider]  diltiazem  (CARDIZEM ) 30 MG tablet Take 1 tablet by mouth every 4 hours as needed for heart rates greater than 110 11/22/23   Mealor, Augustus E, MD  esomeprazole (NEXIUM) 40 MG packet Take 40 mg by mouth 3 (three) times a week.    [provider]  HYDROcodone -acetaminophen  (NORCO/VICODIN) 5-325 MG tablet Take 1 tablet by mouth every 4 (four) hours as needed. 10/02/22   [provider]  metFORMIN  (GLUCOPHAGE ) 500 MG tablet Take 1 tablet (500 mg total) by mouth 2 (two) times daily with a meal. 09/17/22   Katrinka Garnette KIDD, MD  Multiple Vitamin (MULTIVITAMIN ADULT PO) Take by mouth. One a day    [provider]   POTASSIUM PO Take by mouth. Twice a week.    [provider]  tiZANidine (ZANAFLEX) 4 MG tablet Take 4 mg by mouth 2 (two) times daily.    [provider]    Allergies: Sulfasalazine, Celecoxib, Codeine, Ibuprofen, Meloxicam, Nsaids, Rofecoxib, Sulfa antibiotics, and Sulfonamide derivatives    Review of Systems  Skin:        Scalp pain  All other systems reviewed and are negative.   Updated Vital Signs BP (!) 173/70   Pulse 93   Temp 98.5 F (36.9 C)   Resp 18   Ht 5' 4.5 (1.638 m)   Wt 61.2 kg   SpO2 96%   BMI 22.81 kg/m   Physical Exam Vitals and nursing note reviewed.  Constitutional:      General: She is not in acute distress.    Appearance: She is well-developed.  HENT:     Head: Normocephalic and atraumatic.  Eyes:     Conjunctiva/sclera: Conjunctivae normal.  Cardiovascular:     Rate and Rhythm: Normal rate and regular rhythm.     Heart sounds: No murmur heard. Pulmonary:     Effort: Pulmonary effort is normal. No respiratory distress.     Breath sounds: Normal breath sounds.  Abdominal:     Palpations: Abdomen is soft.     Tenderness: There is no abdominal tenderness.  Musculoskeletal:        General: No swelling.     Cervical  back: Neck supple.  Skin:    General: Skin is warm and dry.     Capillary Refill: Capillary refill takes less than 2 seconds.     Findings: Lesion present.         Comments: Prior scar from wound appears well healed. Small scab present just superior to scar line but no erythema, swelling, discharge, or drainage present.  Neurological:     Mental Status: She is alert.  Psychiatric:        Mood and Affect: Mood normal.     (all labs ordered are listed, but only abnormal results are displayed) Labs Reviewed - No data to display  EKG: None  Radiology: CT Head Wo Contrast Result Date: 06/09/2024 EXAM: CT HEAD WITHOUT CONTRAST 06/09/2024 09:50:00 PM TECHNIQUE: CT of the head was performed without the  administration of intravenous contrast. Automated exposure control, iterative reconstruction, and/or weight based adjustment of the mA/kV was utilized to reduce the radiation dose to as low as reasonably achievable. COMPARISON: 12/27/2023 CLINICAL HISTORY: Headache, increasing frequency or severity; Scalp sensitivity around prior laceration repair site FINDINGS: BRAIN AND VENTRICLES: No acute hemorrhage. No evidence of acute infarct. No hydrocephalus. No extra-axial collection. No mass effect or midline shift. ORBITS: No acute abnormality. SINUSES: No acute abnormality. SOFT TISSUES AND SKULL: No acute soft tissue abnormality. No skull fracture. IMPRESSION: 1. No acute intracranial abnormality. Electronically signed by: Pinkie Pebbles MD MD 06/09/2024 09:57 PM EST RP Workstation: [id number]     Procedures   Medications Ordered in the ED - No data to display                                  Medical Decision Making Amount and/or Complexity of Data Reviewed Radiology: ordered.   This patient presents to the ED for concern of head injury.  Differential diagnosis includes scalp laceration, shingles, cellulitis, foreign body, neuropathy    Additional history obtained:  Additional history obtained from chart review   Imaging Studies ordered:  I ordered imaging studies including CT head I independently visualized and interpreted imaging which showed no acute abnormality I agree with the radiologist interpretation   Problem List / ED Course:  Patient with past history significant for type 2 diabetes, pulmonary fibrosis, GERD presents to the emergency department concerns of a prior head injury.  She endorses ongoing scalp irritation to the area that had a laceration about 6 months ago that was repaired using staples.  She states that she has significant pain in this area and having difficulty laying down due to the discomfort.  She feels an area that is more tender than others but is unsure  exactly what it may be.  She denies any rash, or recurrent injury in this area.  She has not hit her head or had any falls recently.  No other obvious symptoms that she can recall. Physical exam is largely unremarkable with no appreciable rash, laceration, or other injury. Given prolonged course of symptoms, suspect likely neurologic injury in this area since she had initially sustained laceration and repair.  Will repeat CT imaging for further assessment given prolonged symptoms. CT head negative for any acute findings. Based on her symptoms, I do think she would benefit from follow-up with her neurologist.  Return precautions discussed such as concerns or worsening symptoms.  Encouraged use of Tylenol  and ibuprofen in the interim.  She is otherwise stable for outpatient follow-up and discharged  home.   Social Determinants of Health:  None  Final diagnoses:  Scalp pain    ED Discharge Orders     None          Cecily Legrand DELENA DEVONNA 06/09/24 [id number]    Franklyn Sid SAILOR, MD 06/09/24 [id number]  "

## 2024-07-18 ENCOUNTER — Ambulatory Visit: Payer: Medicare Other
